# Patient Record
Sex: Male | Born: 1940 | Race: White | Hispanic: No | Marital: Married | Smoking: Former smoker
Health system: Southern US, Community
[De-identification: ages and names within clinical notes are randomized; demographics above are authoritative.]

## PROBLEM LIST (undated history)

## (undated) DIAGNOSIS — K439 Ventral hernia without obstruction or gangrene: Secondary | ICD-10-CM

## (undated) DIAGNOSIS — I498 Other specified cardiac arrhythmias: Secondary | ICD-10-CM

## (undated) DIAGNOSIS — I872 Venous insufficiency (chronic) (peripheral): Secondary | ICD-10-CM

## (undated) DIAGNOSIS — J45909 Unspecified asthma, uncomplicated: Secondary | ICD-10-CM

## (undated) DIAGNOSIS — R1013 Epigastric pain: Secondary | ICD-10-CM

## (undated) DIAGNOSIS — H409 Unspecified glaucoma: Secondary | ICD-10-CM

## (undated) DIAGNOSIS — T8859XA Other complications of anesthesia, initial encounter: Secondary | ICD-10-CM

## (undated) DIAGNOSIS — E785 Hyperlipidemia, unspecified: Secondary | ICD-10-CM

## (undated) DIAGNOSIS — H539 Unspecified visual disturbance: Secondary | ICD-10-CM

## (undated) DIAGNOSIS — I4819 Other persistent atrial fibrillation: Secondary | ICD-10-CM

## (undated) DIAGNOSIS — N200 Calculus of kidney: Secondary | ICD-10-CM

## (undated) DIAGNOSIS — I10 Essential (primary) hypertension: Secondary | ICD-10-CM

## (undated) DIAGNOSIS — H918X9 Other specified hearing loss, unspecified ear: Secondary | ICD-10-CM

## (undated) DIAGNOSIS — J309 Allergic rhinitis, unspecified: Secondary | ICD-10-CM

## (undated) DIAGNOSIS — R55 Syncope and collapse: Secondary | ICD-10-CM

## (undated) DIAGNOSIS — C8330 Diffuse large B-cell lymphoma, unspecified site: Secondary | ICD-10-CM

## (undated) DIAGNOSIS — M7989 Other specified soft tissue disorders: Secondary | ICD-10-CM

## (undated) HISTORY — DX: Epigastric pain: R10.13

## (undated) HISTORY — DX: Other specified hearing loss, unspecified ear: H91.8X9

## (undated) HISTORY — PX: CATARACT EXTRACTION: SUR2

## (undated) HISTORY — DX: Ventral hernia without obstruction or gangrene: K43.9

## (undated) HISTORY — DX: Other specified soft tissue disorders: M79.89

## (undated) HISTORY — DX: Other persistent atrial fibrillation: I48.19

## (undated) HISTORY — DX: Essential (primary) hypertension: I10

## (undated) HISTORY — DX: Hyperlipidemia, unspecified: E78.5

## (undated) HISTORY — DX: Unspecified asthma, uncomplicated: J45.909

## (undated) HISTORY — DX: Unspecified glaucoma: H40.9

## (undated) HISTORY — DX: Venous insufficiency (chronic) (peripheral): I87.2

## (undated) HISTORY — DX: Diffuse large B-cell lymphoma, unspecified site: C83.30

## (undated) HISTORY — DX: Allergic rhinitis, unspecified: J30.9

## (undated) HISTORY — DX: Calculus of kidney: N20.0

## (undated) HISTORY — DX: Unspecified visual disturbance: H53.9

## (undated) HISTORY — DX: Other specified cardiac arrhythmias: I49.8

## (undated) HISTORY — DX: Syncope and collapse: R55

---

## 2000-10-30 ENCOUNTER — Emergency Department (HOSPITAL_COMMUNITY): Admission: EM | Admit: 2000-10-30 | Discharge: 2000-10-30 | Payer: Self-pay | Admitting: Internal Medicine

## 2000-10-30 ENCOUNTER — Encounter: Payer: Self-pay | Admitting: Internal Medicine

## 2003-03-01 ENCOUNTER — Ambulatory Visit (HOSPITAL_COMMUNITY): Admission: RE | Admit: 2003-03-01 | Discharge: 2003-03-01 | Payer: Self-pay | Admitting: Specialist

## 2004-02-02 ENCOUNTER — Encounter: Admission: RE | Admit: 2004-02-02 | Discharge: 2004-02-02 | Payer: Self-pay | Admitting: Internal Medicine

## 2004-06-08 ENCOUNTER — Ambulatory Visit: Payer: Self-pay | Admitting: Internal Medicine

## 2004-12-08 ENCOUNTER — Emergency Department (HOSPITAL_COMMUNITY): Admission: EM | Admit: 2004-12-08 | Discharge: 2004-12-08 | Payer: Self-pay | Admitting: Emergency Medicine

## 2004-12-14 ENCOUNTER — Ambulatory Visit: Payer: Self-pay | Admitting: Internal Medicine

## 2005-08-07 LAB — HM COLONOSCOPY: HM Colonoscopy: NEGATIVE

## 2005-10-30 ENCOUNTER — Ambulatory Visit: Payer: Self-pay | Admitting: Internal Medicine

## 2005-11-05 ENCOUNTER — Ambulatory Visit: Payer: Self-pay | Admitting: Internal Medicine

## 2006-06-28 ENCOUNTER — Ambulatory Visit: Payer: Self-pay | Admitting: Internal Medicine

## 2006-11-15 ENCOUNTER — Ambulatory Visit: Payer: Self-pay | Admitting: Internal Medicine

## 2006-11-15 ENCOUNTER — Encounter: Payer: Self-pay | Admitting: Internal Medicine

## 2006-11-26 ENCOUNTER — Ambulatory Visit: Payer: Self-pay | Admitting: Internal Medicine

## 2006-11-26 LAB — CONVERTED CEMR LAB
HDL: 51.3 mg/dL (ref >39.0)
LDL Cholesterol: 122 mg/dL — ABNORMAL HIGH (ref 0–99)
Total CHOL/HDL Ratio: 3.6
Triglycerides: 59 mg/dL (ref 0–149)
VLDL: 12 mg/dL (ref 0–40)

## 2006-12-12 DIAGNOSIS — J309 Allergic rhinitis, unspecified: Secondary | ICD-10-CM | POA: Insufficient documentation

## 2006-12-12 DIAGNOSIS — H409 Unspecified glaucoma: Secondary | ICD-10-CM | POA: Insufficient documentation

## 2006-12-12 DIAGNOSIS — I10 Essential (primary) hypertension: Secondary | ICD-10-CM

## 2006-12-12 DIAGNOSIS — H918X9 Other specified hearing loss, unspecified ear: Secondary | ICD-10-CM | POA: Insufficient documentation

## 2006-12-12 HISTORY — DX: Allergic rhinitis, unspecified: J30.9

## 2006-12-12 HISTORY — DX: Other specified hearing loss, unspecified ear: H91.8X9

## 2006-12-12 HISTORY — DX: Unspecified glaucoma: H40.9

## 2006-12-12 HISTORY — DX: Essential (primary) hypertension: I10

## 2007-04-25 ENCOUNTER — Ambulatory Visit: Payer: Self-pay | Admitting: Internal Medicine

## 2007-05-04 LAB — CONVERTED CEMR LAB
Cholesterol: 142 mg/dL (ref 0–200)
HDL: 53.4 mg/dL (ref >39.0)
LDL Cholesterol: 77 mg/dL (ref 0–99)
Total CHOL/HDL Ratio: 2.7

## 2007-05-06 ENCOUNTER — Encounter (INDEPENDENT_AMBULATORY_CARE_PROVIDER_SITE_OTHER): Payer: Self-pay | Admitting: *Deleted

## 2007-11-19 ENCOUNTER — Telehealth (INDEPENDENT_AMBULATORY_CARE_PROVIDER_SITE_OTHER): Payer: Self-pay | Admitting: *Deleted

## 2007-11-28 ENCOUNTER — Ambulatory Visit: Payer: Self-pay | Admitting: Internal Medicine

## 2007-11-28 DIAGNOSIS — E785 Hyperlipidemia, unspecified: Secondary | ICD-10-CM

## 2007-11-28 DIAGNOSIS — J45909 Unspecified asthma, uncomplicated: Secondary | ICD-10-CM

## 2007-11-28 HISTORY — DX: Hyperlipidemia, unspecified: E78.5

## 2007-11-28 HISTORY — DX: Unspecified asthma, uncomplicated: J45.909

## 2008-03-24 ENCOUNTER — Ambulatory Visit: Payer: Self-pay | Admitting: Internal Medicine

## 2008-05-24 ENCOUNTER — Telehealth (INDEPENDENT_AMBULATORY_CARE_PROVIDER_SITE_OTHER): Payer: Self-pay | Admitting: *Deleted

## 2008-05-26 ENCOUNTER — Ambulatory Visit: Payer: Self-pay | Admitting: Internal Medicine

## 2008-06-07 ENCOUNTER — Encounter (INDEPENDENT_AMBULATORY_CARE_PROVIDER_SITE_OTHER): Payer: Self-pay | Admitting: *Deleted

## 2008-06-07 LAB — CONVERTED CEMR LAB
ALT: 17 units/L (ref 0–53)
Albumin: 3.8 g/dL (ref 3.5–5.2)
Alkaline Phosphatase: 49 units/L (ref 39–117)
BUN: 14 mg/dL (ref 6–23)
Cholesterol: 141 mg/dL (ref 0–200)
Creatinine, Ser: 0.9 mg/dL (ref 0.4–1.5)
LDL Cholesterol: 64 mg/dL (ref 0–99)
Total Protein: 6.4 g/dL (ref 6.0–8.3)
Triglycerides: 75 mg/dL (ref 0–149)
VLDL: 15 mg/dL (ref 0–40)

## 2008-06-08 ENCOUNTER — Encounter (INDEPENDENT_AMBULATORY_CARE_PROVIDER_SITE_OTHER): Payer: Self-pay | Admitting: Internal Medicine

## 2008-06-08 ENCOUNTER — Ambulatory Visit: Payer: Self-pay | Admitting: Internal Medicine

## 2008-06-08 ENCOUNTER — Telehealth: Payer: Self-pay | Admitting: Internal Medicine

## 2008-06-08 ENCOUNTER — Ambulatory Visit: Payer: Self-pay | Admitting: Surgery

## 2008-06-08 ENCOUNTER — Observation Stay (HOSPITAL_COMMUNITY): Admission: AD | Admit: 2008-06-08 | Discharge: 2008-06-09 | Payer: Self-pay | Admitting: Internal Medicine

## 2008-06-08 DIAGNOSIS — I498 Other specified cardiac arrhythmias: Secondary | ICD-10-CM | POA: Insufficient documentation

## 2008-06-08 DIAGNOSIS — R55 Syncope and collapse: Secondary | ICD-10-CM | POA: Insufficient documentation

## 2008-06-08 HISTORY — DX: Other specified cardiac arrhythmias: I49.8

## 2008-06-28 ENCOUNTER — Ambulatory Visit: Payer: Self-pay | Admitting: Internal Medicine

## 2008-08-31 ENCOUNTER — Telehealth: Payer: Self-pay | Admitting: Internal Medicine

## 2008-09-06 ENCOUNTER — Telehealth (INDEPENDENT_AMBULATORY_CARE_PROVIDER_SITE_OTHER): Payer: Self-pay | Admitting: *Deleted

## 2008-09-08 ENCOUNTER — Telehealth (INDEPENDENT_AMBULATORY_CARE_PROVIDER_SITE_OTHER): Payer: Self-pay | Admitting: *Deleted

## 2008-10-22 ENCOUNTER — Encounter (INDEPENDENT_AMBULATORY_CARE_PROVIDER_SITE_OTHER): Payer: Self-pay | Admitting: *Deleted

## 2008-11-22 ENCOUNTER — Telehealth (INDEPENDENT_AMBULATORY_CARE_PROVIDER_SITE_OTHER): Payer: Self-pay | Admitting: *Deleted

## 2008-12-17 ENCOUNTER — Telehealth (INDEPENDENT_AMBULATORY_CARE_PROVIDER_SITE_OTHER): Payer: Self-pay | Admitting: *Deleted

## 2008-12-27 ENCOUNTER — Telehealth (INDEPENDENT_AMBULATORY_CARE_PROVIDER_SITE_OTHER): Payer: Self-pay | Admitting: *Deleted

## 2009-01-04 ENCOUNTER — Ambulatory Visit: Payer: Self-pay | Admitting: Internal Medicine

## 2009-01-04 DIAGNOSIS — K439 Ventral hernia without obstruction or gangrene: Secondary | ICD-10-CM

## 2009-01-04 HISTORY — DX: Ventral hernia without obstruction or gangrene: K43.9

## 2009-01-21 ENCOUNTER — Encounter: Payer: Self-pay | Admitting: Internal Medicine

## 2009-02-05 ENCOUNTER — Emergency Department (HOSPITAL_COMMUNITY): Admission: EM | Admit: 2009-02-05 | Discharge: 2009-02-05 | Payer: Self-pay | Admitting: Emergency Medicine

## 2009-02-09 ENCOUNTER — Encounter: Payer: Self-pay | Admitting: Internal Medicine

## 2009-03-09 ENCOUNTER — Encounter: Payer: Self-pay | Admitting: Internal Medicine

## 2009-03-16 ENCOUNTER — Encounter: Payer: Self-pay | Admitting: Internal Medicine

## 2009-03-28 ENCOUNTER — Telehealth (INDEPENDENT_AMBULATORY_CARE_PROVIDER_SITE_OTHER): Payer: Self-pay | Admitting: *Deleted

## 2009-06-14 ENCOUNTER — Ambulatory Visit: Payer: Self-pay | Admitting: Internal Medicine

## 2009-07-06 ENCOUNTER — Ambulatory Visit: Payer: Self-pay | Admitting: Internal Medicine

## 2009-07-06 LAB — CONVERTED CEMR LAB
BUN: 11 mg/dL (ref 6–23)
Bilirubin, Direct: 0.1 mg/dL (ref 0.0–0.3)
Cholesterol: 129 mg/dL (ref 0–200)
Creatinine, Ser: 0.7 mg/dL (ref 0.4–1.5)
HDL: 59.8 mg/dL (ref >39.00)
LDL Cholesterol: 61 mg/dL (ref 0–99)
Potassium: 4.2 meq/L (ref 3.5–5.1)
Total Protein: 6.2 g/dL (ref 6.0–8.3)
VLDL: 8 mg/dL (ref 0.0–40.0)

## 2009-07-11 ENCOUNTER — Telehealth (INDEPENDENT_AMBULATORY_CARE_PROVIDER_SITE_OTHER): Payer: Self-pay | Admitting: *Deleted

## 2009-07-14 ENCOUNTER — Ambulatory Visit: Payer: Self-pay | Admitting: Internal Medicine

## 2009-07-14 DIAGNOSIS — R1013 Epigastric pain: Secondary | ICD-10-CM

## 2009-07-14 HISTORY — DX: Epigastric pain: R10.13

## 2009-07-28 ENCOUNTER — Ambulatory Visit: Payer: Self-pay | Admitting: Internal Medicine

## 2009-07-28 LAB — CONVERTED CEMR LAB: OCCULT 3: NEGATIVE

## 2009-07-29 ENCOUNTER — Encounter (INDEPENDENT_AMBULATORY_CARE_PROVIDER_SITE_OTHER): Payer: Self-pay | Admitting: *Deleted

## 2009-12-12 ENCOUNTER — Telehealth: Payer: Self-pay | Admitting: Internal Medicine

## 2010-01-18 ENCOUNTER — Ambulatory Visit: Payer: Self-pay | Admitting: Internal Medicine

## 2010-01-18 DIAGNOSIS — I872 Venous insufficiency (chronic) (peripheral): Secondary | ICD-10-CM | POA: Insufficient documentation

## 2010-01-18 DIAGNOSIS — R609 Edema, unspecified: Secondary | ICD-10-CM | POA: Insufficient documentation

## 2010-01-18 DIAGNOSIS — T148XXA Other injury of unspecified body region, initial encounter: Secondary | ICD-10-CM | POA: Insufficient documentation

## 2010-01-18 HISTORY — DX: Venous insufficiency (chronic) (peripheral): I87.2

## 2010-01-24 ENCOUNTER — Telehealth (INDEPENDENT_AMBULATORY_CARE_PROVIDER_SITE_OTHER): Payer: Self-pay | Admitting: *Deleted

## 2010-03-14 ENCOUNTER — Telehealth: Payer: Self-pay | Admitting: Family Medicine

## 2010-03-24 ENCOUNTER — Ambulatory Visit: Payer: Self-pay | Admitting: Family Medicine

## 2010-03-24 DIAGNOSIS — N200 Calculus of kidney: Secondary | ICD-10-CM

## 2010-03-24 HISTORY — DX: Calculus of kidney: N20.0

## 2010-06-23 ENCOUNTER — Ambulatory Visit: Payer: Self-pay | Admitting: Family Medicine

## 2010-06-28 LAB — CONVERTED CEMR LAB
Alkaline Phosphatase: 56 units/L (ref 39–117)
Bilirubin, Direct: 0.2 mg/dL (ref 0.0–0.3)
Calcium: 9 mg/dL (ref 8.4–10.5)
GFR calc non Af Amer: 139.13 mL/min (ref >60.00)
HDL: 57.9 mg/dL (ref >39.00)
LDL Cholesterol: 67 mg/dL (ref 0–99)
Sodium: 140 meq/L (ref 135–145)
Total CHOL/HDL Ratio: 2
VLDL: 9.2 mg/dL (ref 0.0–40.0)

## 2010-06-29 ENCOUNTER — Ambulatory Visit: Payer: Self-pay | Admitting: Family Medicine

## 2010-08-10 NOTE — Assessment & Plan Note (Signed)
Summary: 3 month fup//ccm/pt rsc/cjr   Vital Signs:  Patient profile:   70 year old male Weight:      199 pounds Temp:     97.8 degrees F oral BP sitting:   120 / 64  (left arm) Cuff size:   large  Vitals Entered By: Sid Falcon LPN (June 29, 2010 8:40 AM)  History of Present Illness: R abd wall hernia.  More painful recently. Present for a few years.  Bulges out at times and becoming more painful Episodes of pain are infrequent.  Location is R lower quadrant. No inguinal swelling.   Hypertension History:      He denies headache, chest pain, palpitations, dyspnea with exertion, orthopnea, peripheral edema, neurologic problems, syncope, and side effects from treatment.  He notes no problems with any antihypertensive medication side effects.        Positive major cardiovascular risk factors include male age 50 years old or older, hyperlipidemia, and hypertension.  Negative major cardiovascular risk factors include no history of diabetes, negative family history for ischemic heart disease, and non-tobacco-user status.        Further assessment for target organ damage reveals no history of ASHD, stroke/TIA, or peripheral vascular disease.    Lipid Management History:      Positive NCEP/ATP III risk factors include male age 45 years old or older and hypertension.  Negative NCEP/ATP III risk factors include non-diabetic, no family history for ischemic heart disease, non-tobacco-user status, no ASHD (atherosclerotic heart disease), no prior stroke/TIA, no peripheral vascular disease, and no history of aortic aneurysm.      Allergies: 1)  ! Sulfa 2)  ! Pcn 3)  ! Erythromycin  Past History:  Past Medical History: Last updated: 06/28/2008 Allergic rhinitis Hypertension Hyperlipidemia Asthma Glaucoma ; Syncope (negative Cardio/Neuro evaluation) 12/09  Past Surgical History: Last updated: 07/14/2009 Cataract  bilat & glaucoma surgery ( 7 Ophth surgeries) Colonoscopy  2003 &  2007 negative  Family History: Last updated: 06/08/2008 Family History Congen Heart Disease,bro Family HistoryBrain Tumor, [place] Family History Suicide, 2 M uncles, M aunt Family History Lung cancer, P aunt  Social History: Last updated: 06/08/2008 Alcohol use-yes Regular exercise-no Retired Married  Risk Factors: Exercise: no (12/12/2006)  Risk Factors: Smoking Status: quit (03/24/2010) PMH-FH-SH reviewed for relevance  Review of Systems  The patient denies anorexia, fever, weight loss, chest pain, syncope, dyspnea on exertion, hemoptysis, melena, hematochezia, and severe indigestion/heartburn.    Physical Exam  General:  Well-developed,well-nourished,in no acute distress; alert,appropriate and cooperative throughout examination Ears:  External ear exam shows no significant lesions or deformities.  Otoscopic examination reveals clear canals, tympanic membranes are intact bilaterally without bulging, retraction, inflammation or discharge. Hearing is grossly normal bilaterally. Mouth:  Oral mucosa and oropharynx without lesions or exudates.  Teeth in good repair. Neck:  No deformities, masses, or tenderness noted. Lungs:  Normal respiratory effort, chest expands symmetrically. Lungs are clear to auscultation, no crackles or wheezes. Heart:  Normal rate and regular rhythm. S1 and S2 normal without gallop, murmur, click, rub or other extra sounds. Abdomen:  soft, non-tender, normal bowel sounds, no distention, and no masses.  No hernia palpated at this time Extremities:  no edema. Neurologic:  alert & oriented X3 and cranial nerves II-XII intact.   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  Surgical referral. Orders: Surgical Referral (Surgery)  Problem # 3:  HYPERTENSION (ICD-401.9)  His updated medication list for this problem includes:    Norvasc 5 Mg Tabs (Amlodipine besylate) .Marland Kitchen... 1 by mouth bid    Lotensin 20 Mg Tabs (Benazepril hcl) .Marland Kitchen..Marland Kitchen Two times a day  Problem # 4:  Preventive Health Care (ICD-V70.0) pt requesting Zostavax.  No contraindications.  Complete Medication List: 1)  Cialis 20 Mg Tabs (Tadalafil) 2)  Norvasc 5 Mg Tabs (Amlodipine besylate) .Marland Kitchen.. 1 by mouth bid 3)  Lotensin 20 Mg Tabs (Benazepril hcl) .... Two times a day 4)  Pravastatin Sodium 40 Mg Tabs (Pravastatin sodium) .... Take one tablet at bedtime**labs due now** 5)  Multivitamins Tabs (Multiple vitamin) .... Take 1 tablet by mouth daily 6)  Aspirin Ec Lo-dose 81 Mg Tbec (Aspirin) .... Take 1 tablet daily by mouth 7)  Oxycodone-acetaminophen 5-325 Mg Tabs (Oxycodone-acetaminophen) .Marland Kitchen.. 1-2 by mouth q 4-6 hours as needed severe pain  Other Orders: Zoster (Shingles) Vaccine Live [id number]) Admin 1st Vaccine (82956)  Hypertension Assessment/Plan:      The patient's hypertensive risk group is category B: At least one risk factor (excluding diabetes) with no target organ damage.  His calculated 10 year risk of coronary heart disease is 7 %.  Today's blood pressure is 120/64.    Lipid Assessment/Plan:      Based on NCEP/ATP III, the patient's risk factor category is "0-1 risk factors".  The patient's lipid goals are as follows: Total cholesterol goal is 200; LDL cholesterol goal is 130; HDL cholesterol goal is 40; Triglyceride goal is 150.  His LDL cholesterol goal has been met.    Patient Instructions: 1)  Please schedule a follow-up appointment in 6 months .  2)  Check your  Blood Pressure regularly . If it is above:140/90   you should make an appointment.   Orders Added: 1)  Est. Patient Level IV [21308] 2)  Surgical  Referral [Surgery] 3)  Zoster (Shingles) Vaccine Live [90736] 4)  Admin 1st Vaccine [65784]   Immunizations Administered:  Zostavax # 1:    Vaccine Type: Zostavax    Site: left deltoid    Mfr: Merck    Dose: 0.5 ml    Route: [place]    Given by: Sid Falcon LPN    Exp. Date: 05/10/2011    Lot #: [id number]    VIS given: 04/20/05 given June 29, 2010.   Immunizations Administered:  Zostavax # 1:    Vaccine Type: Zostavax    Site: left deltoid    Mfr: Merck    Dose: 0.5 ml    Route: [place]    Given by: Sid Falcon LPN    Exp. Date: 05/10/2011    Lot #: [id number]    VIS given: 04/20/05 given June 29, 2010.

## 2010-08-10 NOTE — Progress Notes (Signed)
Summary: wants referral for hernia  Phone Note Call from Patient Call back at Work Phone [phone]   Caller: Patient Summary of Call: Was referred to surgeon for hernia and had decided not to do it, but becoming more of a problem, would like referral again Initial call taken by: Kandice Hams,  December 12, 2009 4:23 PM  Follow-up for Phone Call        done; please send 01/04/2009 OV note Follow-up by: Marga Melnick MD,  December 13, 2009 5:33 AM

## 2010-08-10 NOTE — Assessment & Plan Note (Signed)
Summary: lump on shin/cbs   Vital Signs:  Patient profile:   70 year old male Weight:      197.8 pounds Temp:     98.7 degrees F oral Pulse rate:   56 / minute Resp:     16 per minute BP sitting:   112 / 70  (left arm) Cuff size:   large  Vitals Entered By: Shonna Chock CMA (January 18, 2010 3:45 PM) CC: Lump on left leg x 2 weeks (injured)  Comments REVIEWED MED LIST, PATIENT AGREED DOSE AND INSTRUCTION CORRECT    CC:  Lump on left leg x 2 weeks (injured) .  History of Present Illness: Persistent swelling over L superior tibia after being struck by 1/2 liter bottle of water which fell from fridge. Rx: ice X 3-4 days  with some decrease in size. but stable X 1 week.  Allergies: 1)  ! Sulfa 2)  ! Pcn 3)  ! Erythromycin  Review of Systems General:  Denies chills, fever, and sweats. CV:  Denies chest pain or discomfort, difficulty breathing at night, and difficulty breathing while lying down. Resp:  Denies chest pain with inspiration, coughing up blood, shortness of breath, and wheezing.  Physical Exam  General:  in no acute distress; alert,appropriate and cooperative throughout examination Pulses:  R and L dorsalis pedis and posterior tibial pulses are full and equal bilaterally Extremities:  1+ left pedal edema and 1+ right pedal edema.  Neg Homan's . 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 1 by mouth bid 3)  Lotensin 20 Mg Tabs (Benazepril hcl) .... Two times a day 4)  Pravastatin Sodium 40 Mg Tabs (Pravastatin sodium) .... Take one tablet at bedtime**labs due now** 5)   Multivitamins Tabs (Multiple vitamin) .... Take 1 tablet by mouth daily 6)  Aspirin Ec Lo-dose 81 Mg Tbec (Aspirin) .... Take 1 tablet daily by mouth 7)  Ranitidine Hcl 150 Mg Caps (Ranitidine hcl) .Marland Kitchen.. 1bid pre meals  Patient Instructions: 1)  Consider support hose for edema & venous insufficiency.Limit your Sodium (Salt) to less than 4 grams a day (slightly less than 1 teaspoon) to prevent fluid retention, swelling, or worsening or symptoms.

## 2010-08-10 NOTE — Miscellaneous (Signed)
Summary: Waiver of Liability for Shingles Vaccine  Waiver of Liability for Shingles Vaccine   Imported By: Maryln Gottron 07/06/2010 09:11:44  _____________________________________________________________________  External Attachment:    Type:   Image     Comment:   External Document

## 2010-08-10 NOTE — Progress Notes (Signed)
Summary: REQUEST FOR SWITCH  Phone Note Call from Patient   Caller: Patient Summary of Call: Pt called to request a switch from Dr Alwyn Ren at [facility]  to  Dr Caryl Never at [facility]..... Pt adv that he has recently moved to [place] and would like to go to a PCP closer to home so his wife doesn't have to get off of work to take him across town to [employer].... For this reason, pt would like to switch to [facility]..... Pt was adv that have to mutually agree to the switch, then an appt can be scheduled..... Ok to switch?  Pt can be reached at [phone].  Initial call taken by: Debbra Riding,  March 14, 2010 9:18 AM  Follow-up for Phone Call        Transfer of care appropriate based on logistics . It has been an honor & pleasure to work with him over the years. Follow-up by: Marga Melnick MD,  March 14, 2010 5:08 PM  Additional Follow-up for Phone Call Additional follow up Details #1::        OK with me Additional Follow-up by: Evelena Peat MD,  March 14, 2010 6:22 PM    Additional Follow-up for Phone Call Additional follow up Details #2::    Appt scheduled for 9/15 at 3pm with Dr Caryl Never for est w/ [facility].  Follow-up by: Debbra Riding,  March 15, 2010 8:35 AM

## 2010-08-10 NOTE — Progress Notes (Signed)
Summary: Refill Request  Phone Note Refill Request Call back at [phone] Message from:  Pharmacy on January 24, 2010 9:05 AM  Refills Requested: Medication #1:  NORVASC 5 MG  TABS 1 by mouth bid   Dosage confirmed as above?Dosage Confirmed   Supply Requested: 3 months   Last Refilled: 10/12/2009 [facility]  Next Appointment Scheduled: none Initial call taken by: Lavell Islam,  January 24, 2010 9:06 AM    Prescriptions: NORVASC 5 MG  TABS (AMLODIPINE BESYLATE) 1 by mouth bid  #180 x 2   Entered by:   Shonna Chock CMA   Authorized by:   Marga Melnick MD   Signed by:   Shonna Chock CMA on 01/24/2010   Method used:   Electronically to        [facility]* (retail)       [address]       [place], [place]  [address]       Ph: [phone]       Fax: [phone]   RxID:   [id number]

## 2010-08-10 NOTE — Progress Notes (Signed)
Summary: amlodipine refill   Phone Note Refill Request Message from:  Pharmacy on [facility] FAX [phone]  AMLODIPINE BESYLATE 5MG   Initial call taken by: Barb Merino,  March 28, 2009 10:26 AM    Prescriptions: NORVASC 5 MG  TABS (AMLODIPINE BESYLATE) 1 by mouth bid  #180 x 0   Entered by:   Doristine Devoid   Authorized by:   Neena Rhymes MD   Signed by:   Doristine Devoid on 03/28/2009   Method used:   Electronically to        [facility]* (retail)       [address]       [place], [place]  [address]       Ph: [phone]       Fax: [phone]   RxID:   [id number]

## 2010-08-10 NOTE — Progress Notes (Signed)
Summary: refill request   Phone Note Refill Request   Refills Requested: Medication #1:  NORVASC 5 MG  TABS 1 by mouth bid   Dosage confirmed as above?Dosage Confirmed   Brand Name Necessary? No refill request from [facility]  Next Appointment Scheduled: 07/14/09 Initial call taken by: Michaelle Copas,  July 11, 2009 4:58 PM    Prescriptions: NORVASC 5 MG  TABS (AMLODIPINE BESYLATE) 1 by mouth bid  #180 x 1   Entered by:   Shonna Chock   Authorized by:   Marga Melnick MD   Signed by:   Shonna Chock on 07/13/2009   Method used:   Electronically to        [facility]* (retail)       [address]       [place], [place]  [address]       Ph: [phone]       Fax: [phone]   RxID:   [id number]

## 2010-08-10 NOTE — Letter (Signed)
Summary: Results Follow up Letter  [facility] [place]  [address] [place], [place] [address]   Phone: [phone]  Fax: [phone]    07/29/2009 MRN: [id number]  [place] Charles Li [address] [place], [place]  [address]  Dear Charles Li,  The following are the results of your recent test(s):  Test         Result    Pap Smear:        Normal _____  Not Normal _____ Comments: ______________________________________________________ Cholesterol: LDL(Bad cholesterol):         Your goal is less than:         HDL (Good cholesterol):       Your goal is more than: Comments:  ______________________________________________________ Mammogram:        Normal _____  Not Normal _____ Comments:  ___________________________________________________________________ Hemoccult:        Normal __X___  Not normal _______ Comments:    _____________________________________________________________________ Other Tests:    We routinely do not discuss normal results over the telephone.  If you desire a copy of the results, or you have any questions about this information we can discuss them at your next office visit.   Sincerely,

## 2010-08-10 NOTE — Assessment & Plan Note (Signed)
Summary: review labs//fd   Vital Signs:  Patient profile:   70 year old male Weight:      199.3 pounds Pulse rate:   54 / minute Resp:     15 per minute BP sitting:   122 / 74  (left arm) Cuff size:   large  Vitals Entered By: Shonna Chock (July 14, 2009 9:20 AM) CC: Review Labs(copy given) and refill meds, Hypertension Management Comments REVIEWED MED LIST, PATIENT AGREED DOSE AND INSTRUCTION CORRECT    CC:  Review Labs(copy given) and refill meds and Hypertension Management.  History of Present Illness: Labs reviewed ; all @ goal on Pravastatin 40 mg at bedtime.No specific diet. CVE as walking 5X/ week 30-60 min w/o symptoms. New issue is epigastric aching  pain for 1 hour  post meal 2-3 x /week, w/o specific trigger. Colonoscopy X 2 negative; no Endoscopy.  Hypertension History:      He complains of peripheral edema and visual symptoms, but denies headache, chest pain, palpitations, dyspnea with exertion, orthopnea, PND, neurologic problems, syncope, and side effects from treatment.  He notes no problems with any antihypertensive medication side effects.        Positive major cardiovascular risk factors include male age 2 years old or older, hyperlipidemia, and hypertension.  Negative major cardiovascular risk factors include no history of diabetes, negative family history for ischemic heart disease, and non-tobacco-user status.        Further assessment for target organ damage reveals no history of ASHD, stroke/TIA, or peripheral vascular disease.     Allergies: 1)  ! Sulfa 2)  ! Pcn 3)  ! Erythromycin  Past History:  Past Surgical History: Cataract  bilat & glaucoma surgery ( 7 Ophth surgeries) Colonoscopy  2003 & 2007 negative  Review of Systems General:  Denies chills, fever, sweats, and weight loss. Eyes:  Complains of blurring and vision loss-both eyes; Loss from glaucoma. ENT:  Denies difficulty swallowing and hoarseness. CV:  Denies leg cramps with exertion,  lightheadness, and near fainting; Occa edema  of feet. GI:  See HPI; Denies bloody stools, constipation, dark tarry stools, diarrhea, and indigestion.  Physical Exam  General:  well-nourished,in no acute distress; alert,appropriate and cooperative throughout examination Eyes:  Scleritis changes; lid edema (no angioedema symptoms; "due to condition & treatment") Mouth:  Oral mucosa and oropharynx without lesions or exudates.  Teeth in good repair. No pharyngeal erythema.   Lungs:  Normal respiratory effort, chest expands symmetrically. Lungs are clear to auscultation, no crackles or wheezes. Heart:  normal rate, regular rhythm, no gallop, no rub, no JVD, no HJR, and grade 1 /6 systolic murmur LSB.   Abdomen:  Bowel sounds positive,abdomen soft and non-tender without masses, organomegaly or hernias noted. Pulses:  R and L carotid,radial  and posterior tibial pulses are full and equal bilaterally. Decreased R DPP w/o ischemic change Extremities:  trace left pedal edema and trace right pedal edema.   Neurologic:  alert & oriented X3.   Skin:  Stasis hyperpigmentation over shins; no jaundice Cervical Nodes:  No lymphadenopathy noted Axillary Nodes:  No palpable lymphadenopathy Psych:  memory intact for recent and remote, normally interactive, and good eye contact.     Impression & Recommendations:  Problem # 1:  HYPERLIPIDEMIA (ICD-272.4) Values @ goal His updated medication list for this problem includes:    Pravastatin Sodium 40 Mg Tabs (Pravastatin sodium) .Marland Kitchen... Take one tablet at bedtime**labs due now**  Problem # 2:  ABDOMINAL PAIN, EPIGASTRIC (ICD-789.06)  Post prandial 2-3 X /week  Problem # 3:  HYPERTENSION (ICD-401.9) Controlled His updated medication list for this problem includes:    Norvasc 5 Mg Tabs (Amlodipine besylate) .Marland Kitchen... 1 by mouth bid    Lotensin 20 Mg Tabs (Benazepril hcl) .Marland Kitchen..Marland Kitchen Two times a day  Problem # 4:  ASTHMA (ICD-493.90) Quiescent  Complete Medication  List: 1)  Cialis 20 Mg Tabs (Tadalafil) 2)  Norvasc 5 Mg Tabs (Amlodipine besylate) .Marland Kitchen.. 1 by mouth bid 3)  Lotensin 20 Mg Tabs (Benazepril hcl) .... Two times a day 4)  Pravastatin Sodium 40 Mg Tabs (Pravastatin sodium) .... Take one tablet at bedtime**labs due now** 5)  Multivitamins Tabs (Multiple vitamin) .... Take 1 tablet by mouth daily 6)  Aspirin Ec Lo-dose 81 Mg Tbec (Aspirin) .... Take 1 tablet daily by mouth 7)  Ranitidine Hcl 150 Mg Caps (Ranitidine hcl) .Marland Kitchen.. 1bid pre meals  Hypertension Assessment/Plan:      The patient's hypertensive risk group is category B: At least one risk factor (excluding diabetes) with no target organ damage.  His calculated 10 year risk of coronary heart disease is 7 %.  Today's blood pressure is 122/74.    Patient Instructions: 1)  Complete stool cards. 2)  Avoid foods high in acid (tomatoes, citrus juices, spicy foods). Avoid eating within two hours of lying down or before exercising. Do not over eat; try smaller more frequent meals. Elevate head of bed twelve inches when sleeping. GI consult if no better. Prescriptions: RANITIDINE HCL 150 MG CAPS (RANITIDINE HCL) 1bid pre meals  #60 x 2   Entered and Authorized by:   Marga Melnick MD   Signed by:   Marga Melnick MD on 07/14/2009   Method used:   Faxed to ...       [facility]* (retail)       [address]       [place], [place]  [address]       Ph: [phone]       Fax: [phone]   RxID:   [id number] PRAVASTATIN SODIUM 40 MG  TABS (PRAVASTATIN SODIUM) TAKE ONE TABLET AT BEDTIME**labs due now**  #90 x 3   Entered and Authorized by:   Marga Melnick MD   Signed by:   Marga Melnick MD on 07/14/2009   Method used:   Faxed to ...       [facility]* (retail)       [address]       [place], [place]  [id number]       Ph: [phone]       Fax: [phone]   RxID:   [id number] LOTENSIN 20 MG  TABS (BENAZEPRIL HCL) two times a day  #180 x 3    Entered and Authorized by:   Marga Melnick MD   Signed by:   Marga Melnick MD on 07/14/2009   Method used:   Faxed to ...       [facility]* (retail)       [address]       [place], [place]  [address]       Ph: [phone]       Fax: [phone]   RxID:   [id number]

## 2010-08-10 NOTE — Assessment & Plan Note (Signed)
Summary: PT EST (SWITCH FROM [facility] TO [facility]) OK PER DR BURCHETTE / DR HOPP...   Vital Signs:  Patient profile:   70 year old male Height:      69.50 inches Weight:      200 pounds BMI:     29.22 Temp:     98.0 degrees F oral Pulse rate:   54 / minute Pulse rhythm:   regular Resp:     12 per minute BP sitting:   140 / 78  (left arm) Cuff size:   large  Vitals Entered By: Sid Falcon LPN (March 24, 2010 3:15 PM)  Nutrition Counseling: Patient's BMI is greater than 25 and therefore counseled on weight management options. CC: Transfer from Dr Alwyn Ren in [place], to establish at [facility], Hypertension Management Is Patient Diabetic? No   History of Present Illness: Patient is transferring care here because of proximity to his home and the fact that he is no longer driving. He has been a patient of Dr. Alwyn Ren for several years.  Past medical history is reviewed. He has history of hypertension, glaucoma, hyperlipidemia, asthma, and venous stasis edema.   His medications are reviewed. He is compliant with all. Last labs were last December.  Patient needs flu vaccine.  patient also has history of kidney stones and recent left flank pain about 3 days ago. Pain has ceased earlier today after recurrence. No gross hematuria. No burning with urination. No fever or chills.  Hypertension History:      He denies headache, chest pain, palpitations, dyspnea with exertion, orthopnea, visual symptoms, neurologic problems, and side effects from treatment.        Positive major cardiovascular risk factors include male age 64 years old or older, hyperlipidemia, and hypertension.  Negative major cardiovascular risk factors include no history of diabetes, negative family history for ischemic heart disease, and non-tobacco-user status.        Further assessment for target organ damage reveals no history of ASHD, stroke/TIA, or peripheral vascular disease.     Preventive Screening-Counseling &  Management  Alcohol-Tobacco     Smoking Status: quit     Year Started: 1956     Year Quit: 1990  Allergies: 1)  ! Sulfa 2)  ! Pcn 3)  ! Erythromycin  Past History:  Past Medical History: Last updated: 06/28/2008 Allergic rhinitis Hypertension Hyperlipidemia Asthma Glaucoma ; Syncope (negative Cardio/Neuro evaluation) 12/09  Past Surgical History: Last updated: 07/14/2009 Cataract  bilat & glaucoma surgery ( 7 Ophth surgeries) Colonoscopy  2003 & 2007 negative  Family History: Last updated: 06/08/2008 Family History Congen Heart Disease,bro Family HistoryBrain Tumor, [place] Family History Suicide, 2 M uncles, M aunt Family History Lung cancer, P aunt  Social History: Last updated: 06/08/2008 Alcohol use-yes Regular exercise-no Retired Married  Risk Factors: Exercise: no (12/12/2006)  Risk Factors: Smoking Status: quit (03/24/2010) PMH-FH-SH reviewed for relevance  Physical Exam  General:  Well-developed,well-nourished,in no acute distress; alert,appropriate and cooperative throughout examination Mouth:  Oral mucosa and oropharynx without lesions or exudates.  Teeth in good repair. Neck:  No deformities, masses, or tenderness noted. Lungs:  Normal respiratory effort, chest expands symmetrically. Lungs are clear to auscultation, no crackles or wheezes. Heart:  normal rate and regular rhythm.   Extremities:  trace nonpitting edema lower legs bilaterally Psych:  normally interactive, good eye contact, not anxious appearing, and not depressed appearing.     Impression & Recommendations:  Problem # 1:  HYPERLIPIDEMIA (ICD-272.4) Will schedule fasting labs His updated medication  list for this problem includes:    Pravastatin Sodium 40 Mg Tabs (Pravastatin sodium) .Marland Kitchen... Take one tablet at bedtime**labs due now**  Problem # 2:  HYPERTENSION (ICD-401.9)  His updated medication list for this problem includes:    Norvasc 5 Mg Tabs (Amlodipine besylate) .Marland Kitchen... 1  by mouth bid    Lotensin 20 Mg Tabs (Benazepril hcl) .Marland Kitchen..Marland Kitchen Two times a day  Problem # 3:  RENAL CALCULUS (ICD-592.0) probable recent recurrence.  Pain meds prescribed to use as needed .  He will touch base if any pain by next week to  consider imaging studies.  Problem # 4:  Preventive Health Care (ICD-V70.0) flu vaccine recommended and pt consents.  Complete Medication List: 1)  Cialis 20 Mg Tabs (Tadalafil) 2)  Norvasc 5 Mg Tabs (Amlodipine besylate) .Marland Kitchen.. 1 by mouth bid 3)  Lotensin 20 Mg Tabs (Benazepril hcl) .... Two times a day 4)  Pravastatin Sodium 40 Mg Tabs (Pravastatin sodium) .... Take one tablet at bedtime**labs due now** 5)  Multivitamins Tabs (Multiple vitamin) .... Take 1 tablet by mouth daily 6)  Aspirin Ec Lo-dose 81 Mg Tbec (Aspirin) .... Take 1 tablet daily by mouth 7)  Oxycodone-acetaminophen 5-325 Mg Tabs (Oxycodone-acetaminophen) .Marland Kitchen.. 1-2 by mouth q 4-6 hours as needed severe pain  Other Orders: Admin 1st Vaccine (13086) Flu Vaccine 50yrs + (57846)  Hypertension Assessment/Plan:      The patient's hypertensive risk group is category B: At least one risk factor (excluding diabetes) with no target organ damage.  His calculated 10 year risk of coronary heart disease is 11 %.  Today's blood pressure is 140/78.    Patient Instructions: 1)  Please schedule a follow-up appointment in 3 months .  2)  BMP prior to visit, ICD-9: 401.9 3)  Hepatic Panel prior to visit ICD-9: 272.4 4)  Lipid panel prior to visit ICD-9 : 272.4 Prescriptions: OXYCODONE-ACETAMINOPHEN 5-325 MG TABS (OXYCODONE-ACETAMINOPHEN) 1-2 by mouth q 4-6 hours as needed severe pain  #15 x 0   Entered and Authorized by:   Evelena Peat MD   Signed by:   Evelena Peat MD on 03/24/2010   Method used:   Print then Give to Patient   RxID:   [id number]       Flu Vaccine Consent Questions     Do you have a history of severe allergic reactions to this vaccine? no    Any prior history of  allergic reactions to egg and/or gelatin? no    Do you have a sensitivity to the preservative Thimersol? no    Do you have a past history of Guillan-Barre Syndrome? no    Do you currently have an acute febrile illness? no    Have you ever had a severe reaction to latex? no    Vaccine information given and explained to patient? yes    Are you currently pregnant? no    Lot Number:[id number]   Exp Date:01/06/2011   Site Given  Left Deltoid IMbflu

## 2010-10-05 ENCOUNTER — Telehealth: Payer: Self-pay | Admitting: Family Medicine

## 2010-10-05 MED ORDER — TADALAFIL 20 MG PO TABS: 20.0000 mg | ORAL_TABLET | Freq: Every day | ORAL | Status: DC | PRN

## 2010-10-05 NOTE — Telephone Encounter (Signed)
Pt informed

## 2010-10-05 NOTE — Telephone Encounter (Signed)
Pt would like new rx cialis ?20 mg call into [facility] [phone]

## 2010-10-05 NOTE — Telephone Encounter (Signed)
Go ahead with Cialis 20 mg po qod prn disp # 6 with 3 refills.

## 2010-10-05 NOTE — Telephone Encounter (Signed)
Pt called back again and said that Dr Caryl Never gave him samples of Cialis, because the Levitra didn't work. Pt is req a script for Cialis because it works better. Pt said he already discussed the side effects with Dr Caryl Never, when he was give the samples. Pls call in to [facility].

## 2010-10-05 NOTE — Telephone Encounter (Signed)
I do not see that he has been on this.  Needs office follow up to discuss options and possible side effects.

## 2010-10-15 LAB — URINALYSIS, ROUTINE W REFLEX MICROSCOPIC
Nitrite: NEGATIVE
Specific Gravity, Urine: 1.011 (ref 1.005–1.030)
Urobilinogen, UA: 0.2 mg/dL (ref 0.0–1.0)

## 2010-10-15 LAB — BASIC METABOLIC PANEL
Calcium: 9.4 mg/dL (ref 8.4–10.5)
Creatinine, Ser: 0.83 mg/dL (ref 0.4–1.5)
GFR calc Af Amer: >60 mL/min (ref >60)
GFR calc non Af Amer: >60 mL/min (ref >60)

## 2010-10-15 LAB — DIFFERENTIAL
Basophils Relative: 0 % (ref 0–1)
Monocytes Relative: 4 % (ref 3–12)
Neutro Abs: 12.6 10*3/uL — ABNORMAL HIGH (ref 1.7–7.7)
Neutrophils Relative %: 90 % — ABNORMAL HIGH (ref 43–77)

## 2010-10-15 LAB — URINE MICROSCOPIC-ADD ON

## 2010-10-15 LAB — CBC
MCHC: 34.8 g/dL (ref 30.0–36.0)
RBC: 4.77 MIL/uL (ref 4.22–5.81)
WBC: 14.1 10*3/uL — ABNORMAL HIGH (ref 4.0–10.5)

## 2010-11-06 ENCOUNTER — Encounter: Payer: Self-pay | Admitting: Family Medicine

## 2010-11-07 ENCOUNTER — Encounter: Payer: Self-pay | Admitting: Family Medicine

## 2010-11-07 ENCOUNTER — Ambulatory Visit (INDEPENDENT_AMBULATORY_CARE_PROVIDER_SITE_OTHER): Payer: PRIVATE HEALTH INSURANCE | Admitting: Family Medicine

## 2010-11-07 VITALS — BP 140/80 | Temp 98.2°F | Ht 70.25 in | Wt 202.0 lb

## 2010-11-07 DIAGNOSIS — R1013 Epigastric pain: Secondary | ICD-10-CM

## 2010-11-07 LAB — HEPATIC FUNCTION PANEL
AST: 18 U/L (ref 0–37)
Alkaline Phosphatase: 48 U/L (ref 39–117)
Bilirubin, Direct: 0.2 mg/dL (ref 0.0–0.3)
Total Bilirubin: 1.1 mg/dL (ref 0.3–1.2)

## 2010-11-07 LAB — CBC WITH DIFFERENTIAL/PLATELET
Basophils Absolute: 0 10*3/uL (ref 0.0–0.1)
Hemoglobin: 14.1 g/dL (ref 13.0–17.0)
Lymphocytes Relative: 29.8 % (ref 12.0–46.0)
Monocytes Relative: 9.2 % (ref 3.0–12.0)
Neutrophils Relative %: 57.2 % (ref 43.0–77.0)
Platelets: 207 10*3/uL (ref 150.0–400.0)
RDW: 12.8 % (ref 11.5–14.6)

## 2010-11-07 NOTE — Progress Notes (Signed)
  Subjective:    Patient ID: Charles Li, male    DOB: [birth year], 70 y.o.   MRN: [id number]  HPI Patient seen with onset last Wednesday of some postprandial abdominal discomfort. Symptoms he states were "gas- like" to occasionally dull and sharp. Location midepigastric with possibly some slight radiation right upper quadrant and to the back. Symptoms lasted about 30 minutes and occurred about 30 minutes after eating. No nausea until after breakfast today. No vomiting. No recent stool changes. Good appetite. Denies fever or chills. Took TUMS and over-the-counter anti-gas medication without much sleep. No recent GERD symptoms. No melena or other stool changes. He takes baby aspirin but no other nonsteroidals. Denies any recent chest pain or dyspnea. No history of known gallbladder disease   Review of Systems  Constitutional: Negative for fever, chills, activity change and appetite change.  HENT: Negative for sore throat.   Respiratory: Negative for cough and shortness of breath.   Gastrointestinal: Positive for nausea and abdominal pain. Negative for vomiting, diarrhea, constipation, blood in stool and abdominal distention.  Neurological: Negative for dizziness.       Objective:   Physical Exam  Constitutional: He appears well-developed and well-nourished. No distress.  HENT:  Right Ear: External ear normal.  Left Ear: External ear normal.  Mouth/Throat: Oropharynx is clear and moist. No oropharyngeal exudate.  Neck: No thyromegaly present.  Cardiovascular: Normal rate, regular rhythm and normal heart sounds.   Pulmonary/Chest: Effort normal and breath sounds normal. No respiratory distress. He has no wheezes. He has no rales.  Abdominal: Soft. Bowel sounds are normal. He exhibits no distension and no mass. There is no rebound and no guarding.       Minimally tender midepigastric area. No masses  Lymphadenopathy:    He has no cervical adenopathy.          Assessment & Plan:    Midepigastric pain which is postprandial. Rule out gallstone disease. Obtain ultrasound along with labs including CBC, amylase, and hepatic panel.  If ultrasound negative, consider short term trial of antacid.

## 2010-11-07 NOTE — Patient Instructions (Signed)
Follow up immediately for any fever, vomiting, or worsening abdominal pain.

## 2010-11-08 ENCOUNTER — Telehealth: Payer: Self-pay | Admitting: *Deleted

## 2010-11-08 NOTE — Telephone Encounter (Signed)
Labs were all OK,

## 2010-11-08 NOTE — Progress Notes (Signed)
patient  Is aware 

## 2010-11-08 NOTE — Telephone Encounter (Signed)
Please call pt with lab results, please.

## 2010-11-09 NOTE — Telephone Encounter (Signed)
Pt.notified

## 2010-11-13 ENCOUNTER — Ambulatory Visit
Admission: RE | Admit: 2010-11-13 | Discharge: 2010-11-13 | Disposition: A | Discharge status: Home / Self Care | Payer: PRIVATE HEALTH INSURANCE | Source: Ambulatory Visit | Attending: Family Medicine | Admitting: Family Medicine

## 2010-11-13 ENCOUNTER — Other Ambulatory Visit: Payer: Self-pay | Admitting: Internal Medicine

## 2010-11-13 DIAGNOSIS — R1013 Epigastric pain: Secondary | ICD-10-CM

## 2010-11-14 ENCOUNTER — Telehealth: Payer: Self-pay | Admitting: *Deleted

## 2010-11-14 NOTE — Telephone Encounter (Signed)
VM from pt following informing him of neg [place] for gallstones.  Pt questioning "what's next", what is he to do with ongoing abd pain.

## 2010-11-14 NOTE — Telephone Encounter (Signed)
Make sure he has tried antacid such as Prilosec.  I would recommend referral to GI to further evaluate.  May need EGD to evaluate. We need to see who he has seen previously for colonoscopy.

## 2010-11-14 NOTE — Progress Notes (Signed)
Quick Note:  Pt informed ______ 

## 2010-11-15 NOTE — Telephone Encounter (Signed)
I spoke with pt, he has not tried any antacids, so he will try Prilosec and follow-up if antacid is not helping.  He has had a colonscopy back when he was with Dr Alwyn Ren, does not remember who did it.  I did look through the scanned documents and did not find colonscopy.

## 2010-11-21 NOTE — Assessment & Plan Note (Signed)
[facility]                        [place] OFFICE NOTE   NAME:DOWNEYDeldrick, Charles Li                     MRN:          [id number]  DATE:11/15/2006                            DOB:          02/28/1941    Safal Halderman was seen 11/15/2006 for hypertension and to monitor  medication refills.   His blood pressure has been well-controlled within the range of 100 to  110/75 to 85.  On one occasion, he inadvertently took multivitamins  instead of his blood pressure pills and was found to have a blood  pressure of 202/110 at the [facility] when he attempted to donate blood.   He exercises three days a week at the gym using weights and a treadmill  & bike at a high level .  He has no chest pain, palpitations, or  shortness of breath with this.  He has no headaches or nosebleeds.   His past medical history is unchanged.  He was hospitalized for asthma  at age 70; he stopped smoking following the asthma diagnosis.  He has  had glaucoma surgery, cataract surgery, and a shunting ophthalmologic  procedure as well.  His colonoscopy in 2003 revealed internal  hemorrhoids; repeat is due in 2010.  He is followed by Dr. Hazle Quant for his  glaucoma.   FAMILY HISTORY:  Positive for diabetes in a great-grandmother; his  mother had hypertension.  Father had lung cancer.  There is a history of  a brain tumor in a maternal uncle.  There is also a history of suicide  in an uncle and an aunt.   He drinks socially.   He is ALLERGIC OR INTOLERANT to SULFA, PENICILLIN, and E-MYCIN.   Presently, he is on Amlodipine 5 mg daily, baby coated aspirin, a  multivitamin, Lotensin 20, and eye drops for the glaucoma.   REVIEW OF SYSTEMS:  The remainder of the review of systems is negative  except for occasional pain in the right lower quadrant at the site of a  hernia.  This is not affected by exercises such as crunches, but it is  affected by food intake.  It has not affected his  activities of daily  living but is associated with pain.   PHYSICAL EXAMINATION:  VITAL SIGNS:  The weight is stable at 198.6,  pulse was 60, respiratory rate 14, and blood pressure 110/80.  HEENT:  A fundal exam was difficult; there is evidence of the prior  glaucoma surgeries. Dental hygiene is good; nares are patent, and otic  canals reveal no active process.  NECK:  Thyroid is normal to palpation.  He has no axillary or cervical  lymphadenopathy.  CARDIOVASCULAR:  A grade 0.5 to 1 systolic murmur is noted at the right  base.  There is slight decrease in the pedal pulses, but there are no  ischemic changes.  He does have sclerotic changes over the shins  bilaterally.  ABDOMEN:  He has no organomegaly or masses.  I cannot appreciate a  hernia on palpation.  GENITOURINARY:  There is evidence of a varicocele on the left.  The  prostate is normal; hemoccult  testing is negative.  MUSCULOSKELETAL EXAM:  Negative.  NEUROPSYCHIATRIC EXAM:  Negative.   Medications will be refilled.  In reviewing the chart, he has had  hyperglycemia with normal A1c.  He has had mild elevations of LDL; his  ideal LDL would be less than 100.  Fasting labs will be scheduled.  Lipitor was suggested in May of 2007, but not taken.  He will be  referred to [email or web address] Belly Diet for heart healthy low  carb diet.     Titus Dubin. Alwyn Ren, MD,FACP,FCCP  Electronically Signed    [facility]/MedQ  DD: 11/15/2006  DT: 11/16/2006  Job #: [id number]

## 2010-11-21 NOTE — Consult Note (Signed)
NAME:  ANSON, PEDDIE NO.:  [id number]   MEDICAL RECORD NO.:  [id number]          PATIENT TYPE:  INP   LOCATION:  3712                         FACILITY:  [facility]   PHYSICIAN:  Doylene Canning. Ladona Ridgel, MD    DATE OF BIRTH:  1941-06-21   DATE OF CONSULTATION:  06/09/2008  DATE OF DISCHARGE:  06/09/2008                                 CONSULTATION   REQUESTING PHYSICIANS:  Titus Dubin. Alwyn Ren, MD, FACP, FCCP and  associates.   INDICATION FOR CONSULTATION:  Evaluation of altered consciousness.   HISTORY OF PRESENT ILLNESS:  The patient is a 71 year old male with  hypertension for many years.  He has a history of orthostatic symptoms  in the past where these are rare.  The patient was in his usual state of  health until he suddenly was found to be unresponsive.  He was at this  point in a Bible study and his fellow study members noted that he did  not say anything, his tongue was fluttering, though he did not pass out  and he did not lose control of his bowel or bladder.  He did not have  chest pain.  The patient began to respond after approximately 45  seconds.  The patient states that it felt like he fell asleep.  He notes  that he had not slept as well the night before, though he did state that  he was not as tired as he often is.  After the procedure, there was no  nausea, no diaphoresis, and no other particular symptoms.  He was  subsequently taken to the emergency room.  He was found to be  bradycardic with heart rates in the high 40s; otherwise, his symptoms  have been unremarkable.   Additional past medical history is notable for hypertension.  He has a  history of remote asthma.  He had a stress test over 10 years ago where  he was told this was okay.   SOCIAL HISTORY:  The patient is a retired Solicitor.  He lives in  [place] with his wife.  He has a history of tobacco use but quit  smoking many years ago.  He rarely drinks alcoholic beverages.  She  exercises several times per week without any anginal symptoms.   Family history is notable for mother with hypertension who is deceased,  father in his 35 died with complications of cancer.   Review of systems is notable for decreased vision, wearing glasses.  He  does have some hearing loss.  He has a very mild peripheral edema but  none presently.  He has never had frank syncope.  He denies dysuria,  hematuria, or nocturia.  He has rare arthralgias, particularly in his  lower back.  Otherwise, all systems were reviewed and negative except as  noted in the HPI.   His physical exam is notable for him being a pleasant, well-appearing 2-  year-old man in no distress.  Blood pressure was 156/98, pulse was 50  and regular, respirations were 20, temperature was 97.7.  Orthostatic  symptoms were not significant.  HEENT exam  is normocephalic and  atraumatic.  Pupils are equal and round.  The oropharynx is moist.  Sclerae are anicteric.  The neck revealed no jugular venous distention.  There is no thyromegaly.  Trachea is midline.  The carotids are 2+ and  symmetric.  The lungs are clear bilaterally to auscultation.  There are  no wheezes, rales, or rhonchi present.  No increased work of breathing.  Cardiovascular exam revealed regular rate and rhythm with normal S1 and  S2.  The PMI was not enlarged nor it was laterally displaced.  There was  no S3 or S4 gallop appreciated.  I could not appreciate a murmur.  There  was a regular bradycardia.  Abdominal exam was soft, nontender.  There  was no organomegaly.  The bowel sounds were present.  There was no  rebound or guarding.  Extremities demonstrated no cyanosis, clubbing, or  edema.  The pulses were 2+ and symmetric.  Neurologic exam was alert and  oriented x3.  His cranial nerves were intact.  Strength was 5/5 and  symmetric.   EKG demonstrates sinus bradycardia, normal axis and intervals.  There  was septal Q seen and nonspecific T-wave  abnormality.   IMPRESSION:  1. Isolated episode of unresponsiveness, unclear whether this is true      syncope or whether the patient rather fell asleep.  2. Hypertension.  3. Normal 2-D echocardiogram and chest x-ray.  4. History of asthma remotely.   DISCUSSION:  I have discussed the treatment options with the patient.  I  have recommended a period of watchful waiting.  If he has recurrent  episodes of syncope, then consideration for an implantable loop recorder  would be warranted.  As this is his initial episode of altered  consciousness and I am not even convinced that this represents frank  syncope, I have recommended a period of watchful waiting, continuation  of blood pressure control, and observation for the patient.  I have  asked that he hold off on driving for a week, though because his  symptoms may well be that he fell asleep and not true syncope, I have  not insisted that he not drive.      Doylene Canning. Ladona Ridgel, MD  Electronically Signed     [facility]/MEDQ  D:  06/09/2008  T:  06/10/2008  Job:  [id number]   cc:   Titus Dubin. Alwyn Ren, MD,FACP,FCCP

## 2010-12-25 ENCOUNTER — Other Ambulatory Visit: Payer: Self-pay | Admitting: Internal Medicine

## 2010-12-28 ENCOUNTER — Ambulatory Visit: Payer: Self-pay | Admitting: Family Medicine

## 2011-03-09 ENCOUNTER — Ambulatory Visit (INDEPENDENT_AMBULATORY_CARE_PROVIDER_SITE_OTHER): Payer: PRIVATE HEALTH INSURANCE | Admitting: Family Medicine

## 2011-03-09 ENCOUNTER — Encounter: Payer: Self-pay | Admitting: Family Medicine

## 2011-03-09 VITALS — Temp 97.5°F | Wt 200.0 lb

## 2011-03-09 DIAGNOSIS — L259 Unspecified contact dermatitis, unspecified cause: Secondary | ICD-10-CM

## 2011-03-09 MED ORDER — PREDNISONE 10 MG PO TABS: ORAL_TABLET | ORAL | Status: AC

## 2011-03-09 NOTE — Progress Notes (Signed)
  Subjective:    Patient ID: Charles Li, male    DOB: 1941/05/25, 70 y.o.   MRN: [id number]  HPI Pruritic rash mostly arms and forearms with lesser involvement of trunk. Started about 4 days ago. Known exposure to poison ivy. No history of similar outbreak in past. Has not tried topicals. Difficulty sleeping. Denies any fever or chills. Itch exacerbated by heat. No alleviating factors   Review of Systems  Constitutional: Negative for fever and chills.  Skin: Positive for rash.       Objective:   Physical Exam  Constitutional: He appears well-developed and well-nourished.  Cardiovascular: Normal rate and regular rhythm.   Pulmonary/Chest: Effort normal and breath sounds normal. No respiratory distress. He has no wheezes. He has no rales.  Skin:       Patient has vesicular rash scattered mostly forearms. Erythematous base. No pustules. Nontender.          Assessment & Plan:  Contact dermatitis. Depo-Medrol 80 mg IM. Oral prednisone if he has any persistent or breakthrough symptoms with injection

## 2011-03-09 NOTE — Patient Instructions (Signed)
Poison Ivy (Toxicodendron Dermatitis) Poison ivy is a inflammation of the skin (contact dermatitis) caused by touching the allergens on the leaves of the ivy plant following previous exposure to the plant. The rash usually appears 48 hours after exposure. The rash is usually bumps (papules) or blisters (vesicles) in a linear pattern. Depending on your own sensitivity, the rash may simply cause redness and itching, or it may also progress to blisters which may break open. These must be well cared for to prevent secondary bacterial (germ) infection, followed by scarring. Keep any open areas dry, clean, dressed, and covered with an antibacterial ointment if needed. The eyes may also get puffy. The puffiness is worst in the morning and gets better as the day progresses. This dermatitis usually heals without scarring, within 2 to 3 weeks without treatment. HOME CARE INSTRUCTIONS Thoroughly wash with soap and water as soon as you have been exposed to poison ivy. You have about one half hour to remove the plant resin before it will cause the rash. This washing will destroy the oil or antigen on the skin that is causing, or will cause, the rash. Be sure to wash under your fingernails as any plant resin there will continue to spread the rash. Do not rub skin vigorously when washing affected area. Poison ivy cannot spread if no oil from the plant remains on your body. A rash that has progressed to weeping sores will not spread the rash unless you have not washed thoroughly. It is also important to wash any clothes you have been wearing as these may carry active allergens. The rash will return if you wear the unwashed clothing, even several days later. Avoidance of the plant in the future is the best measure. Poison ivy plant can be recognized by the number of leaves. Generally, poison ivy has three leaves with flowering branches on a single stem. Diphenhydramine may be purchased over the counter and used as needed for  itching. Do not drive with this medication if it makes you drowsy. Ask your caregiver about medication for children. SEEK MEDICAL CARE IF   Open sores develop.   Redness spreads beyond area of rash.   You notice purulent (pus-like) discharge.   You have increased pain.   Other signs of infection develop (such as fever).  Document Released: 06/22/2000 Document Re-Released: 06/13/2009 [employer] Patient Information 2011 [employer]. 

## 2011-04-13 LAB — DIFFERENTIAL
Basophils Relative: 0 % (ref 0–1)
Lymphocytes Relative: 34 % (ref 12–46)
Monocytes Absolute: 0.7 10*3/uL (ref 0.1–1.0)
Monocytes Relative: 9 % (ref 3–12)
Neutro Abs: 4.6 10*3/uL (ref 1.7–7.7)

## 2011-04-13 LAB — CARDIAC PANEL(CRET KIN+CKTOT+MB+TROPI): CK, MB: 3.4 ng/mL (ref 0.3–4.0)

## 2011-04-13 LAB — CBC
HCT: 44.5 % (ref 39.0–52.0)
Hemoglobin: 15 g/dL (ref 13.0–17.0)
MCHC: 33.7 g/dL (ref 30.0–36.0)
RBC: 4.66 MIL/uL (ref 4.22–5.81)

## 2011-04-13 LAB — BASIC METABOLIC PANEL
BUN: 14 mg/dL (ref 6–23)
Chloride: 103 mEq/L (ref 96–112)
Glucose, Bld: 114 mg/dL — ABNORMAL HIGH (ref 70–99)
Potassium: 4.6 mEq/L (ref 3.5–5.1)

## 2011-04-19 ENCOUNTER — Other Ambulatory Visit: Payer: Self-pay | Admitting: Family Medicine

## 2011-06-21 ENCOUNTER — Telehealth: Payer: Self-pay | Admitting: Family Medicine

## 2011-06-21 NOTE — Telephone Encounter (Signed)
Pt informed better to wait until feeling better.  He voiced his understanding

## 2011-06-21 NOTE — Telephone Encounter (Signed)
Pulled from Triage vmail. Wants to know if he can still get flu shot when he has a cold? I figured not, but am not clinical staff, so couldn't call him back. Thanks!

## 2011-09-05 ENCOUNTER — Other Ambulatory Visit: Payer: Self-pay | Admitting: Family Medicine

## 2011-09-05 ENCOUNTER — Other Ambulatory Visit: Payer: Self-pay | Admitting: Internal Medicine

## 2011-09-05 NOTE — Telephone Encounter (Signed)
Refill for 6 months. 

## 2011-10-03 ENCOUNTER — Encounter: Payer: Self-pay | Admitting: Gastroenterology

## 2011-12-17 ENCOUNTER — Encounter: Payer: Self-pay | Admitting: Gastroenterology

## 2011-12-20 ENCOUNTER — Other Ambulatory Visit: Payer: Self-pay | Admitting: Family Medicine

## 2012-01-11 ENCOUNTER — Encounter: Payer: Self-pay | Admitting: Family Medicine

## 2012-01-11 ENCOUNTER — Ambulatory Visit (INDEPENDENT_AMBULATORY_CARE_PROVIDER_SITE_OTHER): Payer: PRIVATE HEALTH INSURANCE | Admitting: Family Medicine

## 2012-01-11 VITALS — BP 170/88 | Temp 97.8°F | Wt 200.0 lb

## 2012-01-11 DIAGNOSIS — K439 Ventral hernia without obstruction or gangrene: Secondary | ICD-10-CM

## 2012-01-11 DIAGNOSIS — I10 Essential (primary) hypertension: Secondary | ICD-10-CM

## 2012-01-11 DIAGNOSIS — E785 Hyperlipidemia, unspecified: Secondary | ICD-10-CM

## 2012-01-11 LAB — BASIC METABOLIC PANEL
BUN: 26 mg/dL — ABNORMAL HIGH (ref 6–23)
CO2: 31 mEq/L (ref 19–32)
Chloride: 104 mEq/L (ref 96–112)
Creat: 0.86 mg/dL (ref 0.50–1.35)

## 2012-01-11 LAB — HEPATIC FUNCTION PANEL
ALT: 17 U/L (ref 0–53)
Total Protein: 6.6 g/dL (ref 6.0–8.3)

## 2012-01-11 LAB — LIPID PANEL
Cholesterol: 121 mg/dL (ref 0–200)
Triglycerides: 115 mg/dL (ref <150)

## 2012-01-11 NOTE — Progress Notes (Signed)
  Subjective:    Patient ID: Charles Li, male    DOB: [birth year], 71 y.o.   MRN: [id number]  HPI  Medical followup. Patient has history of hypertension, hyperlipidemia, glaucoma, venous insufficiency, kidney stones. Current medications include Lotensin 20 mg twice daily, Norvasc 5 mg twice daily, aspirin one daily, Pravachol 40 mg daily and Cialis as needed. Compliant with all medications. No side effects. Blood pressures at home generally ranging 130-140 systolic. Has not checked much in recent weeks. No chest pains. No dizziness.  Reported right abdominal wall hernia. Has become more painful recently. Bulges intermittently. Requesting referral to general surgeon. Generally not limiting activities. No recent appetite or weight changes.  Patient has colonoscopy scheduled in August  Past Medical History  Diagnosis Date  . HYPERLIPIDEMIA 11/28/2007  . GLAUCOMA 12/12/2006  . 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 HYPERTENSION 12/12/2006  . 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 VENOUS INSUFFICIENCY 01/18/2010  . ALLERGIC RHINITIS 12/12/2006  . ASTHMA 11/28/2007  . HERNIA, VENTRAL 01/04/2009  . RENAL CALCULUS 03/24/2010  . SYNCOPE 06/08/2008  . Abdominal pain, epigastric 07/14/2009   Past Surgical History  Procedure Date  . Cataract extraction     bilat & glaucoma surgery (7 ophth surgeries)    reports that he quit smoking about 42 years ago. His smoking use included Cigarettes. He has a 21 pack-year smoking history. He does not have any smokeless tobacco history on file. His alcohol and drug histories not on file. family history includes Birth defects in his paternal aunt and Heart disease in his brother. Allergies  Allergen Reactions  . Erythromycin   . Penicillins   . Sulfonamide Derivatives       Review of Systems  Constitutional: Negative for fatigue.  Eyes: Negative for visual disturbance.  Respiratory: Negative for cough, chest tightness and shortness of breath.   Cardiovascular: Negative  for chest pain, palpitations and leg swelling.  Gastrointestinal: Negative for nausea, vomiting, diarrhea, constipation and blood in stool.  Neurological: Negative for dizziness, syncope, weakness, light-headedness and headaches.       Objective:   Physical Exam  Constitutional: He is oriented to person, place, and time. He appears well-developed and well-nourished.  Neck: Neck supple. No thyromegaly present.  Cardiovascular: Normal rate and regular rhythm.   Pulmonary/Chest: Effort normal and breath sounds normal. No respiratory distress. He has no wheezes. He has no rales.  Abdominal: Soft. He exhibits no mass. There is no tenderness. There is no rebound and no guarding.  Musculoskeletal: He exhibits no edema.  Lymphadenopathy:    He has no cervical adenopathy.  Neurological: He is alert and oriented to person, place, and time.  Psychiatric: He has a normal mood and affect. His behavior is normal.          Assessment & Plan:  #1 hypertension. Marginal control. Assess at home and be in touch if consistently over 140/90. Routine followup 6 months #2 hyperlipidemia. Check lipid and hepatic panel. Pravachol 40 mg daily #3 probable right abdominal wall hernia. Becoming more symptomatic. General surgery referral.

## 2012-01-14 NOTE — Progress Notes (Signed)
Quick Note:  Pt informed on personally identified VM ______ 

## 2012-01-21 ENCOUNTER — Ambulatory Visit: Payer: PRIVATE HEALTH INSURANCE | Admitting: Family Medicine

## 2012-01-25 ENCOUNTER — Ambulatory Visit (AMBULATORY_SURGERY_CENTER): Payer: PRIVATE HEALTH INSURANCE | Admitting: *Deleted

## 2012-01-25 VITALS — Ht 70.0 in | Wt 198.7 lb

## 2012-01-25 DIAGNOSIS — Z1211 Encounter for screening for malignant neoplasm of colon: Secondary | ICD-10-CM

## 2012-01-25 MED ORDER — MOVIPREP 100 G PO SOLR: ORAL | Status: DC

## 2012-01-31 ENCOUNTER — Other Ambulatory Visit: Payer: Self-pay | Admitting: Family Medicine

## 2012-02-08 ENCOUNTER — Ambulatory Visit (AMBULATORY_SURGERY_CENTER): Payer: PRIVATE HEALTH INSURANCE | Admitting: Gastroenterology

## 2012-02-08 ENCOUNTER — Encounter: Payer: Self-pay | Admitting: Gastroenterology

## 2012-02-08 VITALS — BP 120/73 | HR 61 | Temp 96.5°F | Resp 20 | Ht 70.0 in | Wt 198.0 lb

## 2012-02-08 DIAGNOSIS — D126 Benign neoplasm of colon, unspecified: Secondary | ICD-10-CM

## 2012-02-08 DIAGNOSIS — Z1211 Encounter for screening for malignant neoplasm of colon: Secondary | ICD-10-CM

## 2012-02-08 MED ORDER — SODIUM CHLORIDE 0.9 % IV SOLN: 500.0000 mL | INTRAVENOUS | Status: DC

## 2012-02-08 NOTE — Patient Instructions (Signed)
Hold aspirin, aspirin products, and anti-inflammatory medications for 2 weeks!! Colon polyp removed today. Await pathology results. Result letter mailed to you in 1-3 weeks. Repeat colonoscopy per result letter.     YOU HAD AN ENDOSCOPIC PROCEDURE TODAY AT THE [facility]: Refer to the procedure report that was given to you for any specific questions about what was found during the examination.  If the procedure report does not answer your questions, please call your gastroenterologist to clarify.  If you requested that your care partner not be given the details of your procedure findings, then the procedure report has been included in a sealed envelope for you to review at your convenience later.  YOU SHOULD EXPECT: Some feelings of bloating in the abdomen. Passage of more gas than usual.  Walking can help get rid of the air that was put into your GI tract during the procedure and reduce the bloating. If you had a lower endoscopy (such as a colonoscopy or flexible sigmoidoscopy) you may notice spotting of blood in your stool or on the toilet paper. If you underwent a bowel prep for your procedure, then you may not have a normal bowel movement for a few days.  DIET: Your first meal following the procedure should be a light meal and then it is ok to progress to your normal diet.  A half-sandwich or bowl of soup is an example of a good first meal.  Heavy or fried foods are harder to digest and may make you feel nauseous or bloated.  Likewise meals heavy in dairy and vegetables can cause extra gas to form and this can also increase the bloating.  Drink plenty of fluids but you should avoid alcoholic beverages for 24 hours.  ACTIVITY: Your care partner should take you home directly after the procedure.  You should plan to take it easy, moving slowly for the rest of the day.  You can resume normal activity the day after the procedure however you should NOT DRIVE or use heavy machinery for 24 hours  (because of the sedation medicines used during the test).    SYMPTOMS TO REPORT IMMEDIATELY: A gastroenterologist can be reached at any hour.  During normal business hours, 8:30 AM to 5:00 PM Monday through Friday, call [phone].  After hours and on weekends, please call the GI answering service at [phone] who will take a message and have the physician on call contact you.   Following lower endoscopy (colonoscopy or flexible sigmoidoscopy):  Excessive amounts of blood in the stool  Significant tenderness or worsening of abdominal pains  Swelling of the abdomen that is new, acute  Fever of 100F or higher  FOLLOW UP: If any biopsies were taken you will be contacted by phone or by letter within the next 1-3 weeks.  Call your gastroenterologist if you have not heard about the biopsies in 3 weeks.  Our staff will call the home number listed on your records the next business day following your procedure to check on you and address any questions or concerns that you may have at that time regarding the information given to you following your procedure. This is a courtesy call and so if there is no answer at the home number and we have not heard from you through the emergency physician on call, we will assume that you have returned to your regular daily activities without incident.  SIGNATURES/CONFIDENTIALITY: You and/or your care partner have signed paperwork which will be entered into your  electronic medical record.  These signatures attest to the fact that that the information above on your After Visit Summary has been reviewed and is understood.  Full responsibility of the confidentiality of this discharge information lies with you and/or your care-partner.

## 2012-02-08 NOTE — Op Note (Signed)
 [facility] [address] [place], [place]  [address]  COLONOSCOPY PROCEDURE REPORT  PATIENT:  Charles Li, Charles Li  MR#:  [id number] BIRTHDATE:  05-Jan-1941, 71 yrs. old  GENDER:  male ENDOSCOPIST:  Judie Petit T. Russella Dar, MD, [facility]  PROCEDURE DATE:  02/08/2012 PROCEDURE:  Colonoscopy with snare polypectomy ASA CLASS:  Class II INDICATIONS:  1) Routine Risk Screening MEDICATIONS:   MAC sedation, administered by CRNA, propofol (Diprivan) 230 mg IV DESCRIPTION OF PROCEDURE:   After the risks benefits and alternatives of the procedure were thoroughly explained, informed consent was obtained.  Digital rectal exam was performed and revealed no abnormalities.   The LB CF-[id number] endoscope was introduced through the anus and advanced to the cecum, which was identified by both the appendix and ileocecal valve, without limitations.  The quality of the prep was good, using MoviPrep. The instrument was then slowly withdrawn as the colon was fully examined. <<PROCEDUREIMAGES>> FINDINGS:  A sessile polyp was found in the ascending colon. It was 12 mm in size. Polyp was snared, then cauterized with monopolar cautery. Retrieval was successful. Piecemeal polypectomy.  Otherwise normal colonoscopy without other polyps, masses, vascular ectasias, or inflammatory changes.   Retroflexed views in the rectum revealed no abnormalities.  The time to cecum =  3.25  minutes. The scope was then withdrawn (time =  9  min) from the patient and the procedure completed.  COMPLICATIONS:  None  ENDOSCOPIC IMPRESSION: 1) 12 mm sessile polyp in the ascending colon  RECOMMENDATIONS: 1) Hold aspirin, aspirin products, and anti-inflammatory medication for 2 weeks. 2) Await pathology results 3) Repeat Colonoscopy in 1 year if polyp is adenomatous, otherwise 10 years.  Venita Lick. Russella Dar, MD, [facility]  n. eSIGNED:   Venita Lick. Tomicka Lover at 02/08/2012 10:48 AM  Bonney Roussel, [id number]

## 2012-02-08 NOTE — Progress Notes (Signed)
Patient did not experience any of the following events: a burn prior to discharge; a fall within the facility; wrong site/side/patient/procedure/implant event; or a hospital transfer or hospital admission upon discharge from the facility. (G8907) Patient did not have preoperative order for IV antibiotic SSI prophylaxis. (G8918)  

## 2012-02-11 ENCOUNTER — Telehealth: Payer: Self-pay

## 2012-02-11 NOTE — Telephone Encounter (Signed)
  Follow up Call-  Call back number 02/08/2012  Post procedure Call Back phone  # [phone]  Permission to leave phone message Yes     Patient questions:  Do you have a fever, pain , or abdominal swelling? no Pain Score  0 *  Have you tolerated food without any problems? yes  Have you been able to return to your normal activities? yes  Do you have any questions about your discharge instructions: Diet   no Medications  no Follow up visit  no  Do you have questions or concerns about your Care? no  Actions: * If pain score is 4 or above: No action needed, pain <4.

## 2012-02-12 ENCOUNTER — Encounter: Payer: Self-pay | Admitting: Gastroenterology

## 2012-03-20 ENCOUNTER — Encounter (INDEPENDENT_AMBULATORY_CARE_PROVIDER_SITE_OTHER): Payer: Self-pay | Admitting: Surgery

## 2012-03-20 ENCOUNTER — Ambulatory Visit (INDEPENDENT_AMBULATORY_CARE_PROVIDER_SITE_OTHER): Payer: PRIVATE HEALTH INSURANCE | Admitting: Surgery

## 2012-03-20 ENCOUNTER — Encounter (INDEPENDENT_AMBULATORY_CARE_PROVIDER_SITE_OTHER): Payer: Self-pay | Admitting: General Surgery

## 2012-03-20 VITALS — BP 152/86 | HR 60 | Temp 97.4°F | Resp 16 | Ht 70.0 in | Wt 195.5 lb

## 2012-03-20 DIAGNOSIS — K439 Ventral hernia without obstruction or gangrene: Secondary | ICD-10-CM | POA: Insufficient documentation

## 2012-03-20 NOTE — Progress Notes (Addendum)
Re:   Charles Li DOB:   1941-04-25 MRN:   [id number]  ASSESSMENT AND PLAN: 1.  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  He could not show it to me today.  I discussed the indications and complications of hernia surgery with the patient.  I discussed both the laparoscopic and open approach to hernia repair..  The potential risks of hernia surgery include, but are not limited to, bleeding, infection, open surgery, nerve injury, and recurrence of the hernia.  I provided the patient literature about hernia surgery.  He wants to go ahead and schedule surgery instead of doing other test.  I talked about doing primarily the laparoscopic approach.  2.  Hypertension 3.  Hypercholesterolemia 4.  History of nephrolithiasis.  Right kidney. 5.  Glaucoma 6.  Venous insuffciency 7.  Bilateral inguinal hernias on CT scan 02/05/2009.  He does have a R>L inguinal hernia.  Both are asymptomatic.  [Mr. Trebilcock had some right groin pain/bulge helping his daughter move boxes in [place].  He called and now wants the inguinal hernias repaired at the same time as the Spigellian hernia.  I discussed with him the risks of the procedure.  I think that these can be done at the same time, but will increase surgery time by one to 2 hours.   DN  04/02/2012]  8.  Diverticulosis by CT scan - 01/08/2009.  Chief Complaint  Patient presents with  . Umbilical Hernia    Abdominal wall   REFERRING PHYSICIAN: Kristian Covey, MD  HISTORY OF PRESENT ILLNESS: Charles Li is a 71 y.o. (DOB: 1941-04-27)  white male whose primary care physician is Kristian Covey, MD and comes to me today for ventral hernia.  He has noticed a bulge in his right mid abdomen for several years.  In fact, he saw Dr. Donell Beers in July 2010 for the same hernia.  She also thought that he had a Spigelian hernia and she felt it.  Sometimes the hernia hurts and he has to bend over to get it to get it back in.  His wife has not seen the  hernia, but confirms his discomfort.  He has no other GI disease.    I reviewed with him that his CT scan in 01/2009 showed bilateral inguinal hernias, but he is unaware of these.  I discussed with him that the CT scan in 2010 did not show a Spigelian hernia, but there are limits to the CT scan.  I offered three options:  1) continue to observe, 2) repeat CT scan (but it was not seen on the last CT scan), and 3) go to surgery.    Past Medical History  Diagnosis Date  . HYPERLIPIDEMIA 11/28/2007  . GLAUCOMA 12/12/2006  . 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 HYPERTENSION 12/12/2006  . 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 VENOUS INSUFFICIENCY 01/18/2010  . ALLERGIC RHINITIS 12/12/2006  . ASTHMA 11/28/2007  . HERNIA, VENTRAL 01/04/2009  . RENAL CALCULUS 03/24/2010  . SYNCOPE 06/08/2008  . Abdominal pain, epigastric 07/14/2009  . Leg swelling   . Visual disturbance   . Abdominal wall hernia       Past Surgical History  Procedure Date  . Cataract extraction 2009 - approximate    bilat & glaucoma surgery (7 ophth surgeries)      Current Outpatient Prescriptions  Medication Sig Dispense Refill  . aspirin 81 MG EC tablet Take 81 mg by mouth daily.        . B Complex Vitamins (B  COMPLEX 1 PO) Take by mouth daily.      . Cholecalciferol (VITAMIN D PO) Take by mouth daily.      Marland Kitchen LOTENSIN 20 MG tablet TAKE 1 TABLET TWICE DAILY.  180 each  3  . Multiple Vitamin (MULTIVITAMIN) tablet Take 1 tablet by mouth daily.      . naproxen sodium (ANAPROX) 220 MG tablet Take 220 mg by mouth 2 (two) times daily with a meal.      . NORVASC 5 MG tablet TAKE 1 TABLET TWICE DAILY.  90 each  3  . Omega-3 Fatty Acids (FISH OIL PO) Take by mouth daily.      Marland Kitchen PRAVACHOL 40 MG tablet TAKE ONE TABLET AT BEDTIME.  90 each  3  . tadalafil (CIALIS) 20 MG tablet Take 20 mg by mouth daily as needed.        Marland Kitchen TIMOPTIC OCUDOSE 0.5 % SOLN 1 drop 2 (two) times daily.       . TRAVATAN Z 0.004 % SOLN ophthalmic solution Place 1 drop into  the right eye at bedtime.           Allergies  Allergen Reactions  . Erythromycin Rash  . Penicillins Rash  . Sulfonamide Derivatives Rash    REVIEW OF SYSTEMS: Skin:  No history of rash.  No history of abnormal moles. Infection:  No history of hepatitis or HIV.  No history of MRSA. Neurologic:  No history of stroke.  No history of seizure.  No history of headaches. Cardiac:  Hypertension for years.  No cardiac eval. Pulmonary:  Does not smoke cigarettes.  No asthma or bronchitis.  No OSA/CPAP.  Endocrine:  No diabetes. No thyroid disease.  Hypercholesterolemia. Gastrointestinal:  No history of stomach disease.  No history of liver disease.  No history of gall bladder disease.  No history of pancreas disease.  No history of colon disease.  Colonoscopy last week by Dr. Russella Dar.  Dr. Russella Dar found a polyp and suggested 1 year follow up. Urologic:  History of kidney stones -2010. Musculoskeletal:  No history of joint or back disease. Hematologic:  No bleeding disorder.  No history of anemia.  Not anticoagulated. Psycho-social:  The patient is oriented.   The patient has no obvious psychologic or social impairment to understanding our conversation and plan.  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 Married.  Wife is with him.  PHYSICAL EXAM: BP 152/86  Pulse 60  Temp 97.4 F (36.3 C) (Temporal)  Resp 16  Ht 5\' 10"  (1.778 m)  Wt 195 lb 8 oz (88.678 kg)  BMI 28.05 kg/m2  General: WN WM who is alert and generally healthy appearing. Some trouble with vision. HEENT: Normal. Pupils equal. Neck: Supple. No mass.  No thyroid mass. Lymph Nodes:  No supraclavicular or cervical nodes. Lungs: Clear to auscultation and symmetric breath sounds. Heart:  RRR. No murmur or rub.  Abdomen: Soft. No mass. No tenderness.  Normal bowel sounds.  No abdominal scars.  Small to medium right inguinal hernia and maybe a small left inguinal hernia.  He cannot  reproduce the abdominal wall hernia, but he is pointing where I would expect a right Spigelian hernia. Rectal: Not done. Extremities:  Good strength and ROM  in upper and lower extremities.  Venous stasis changes of lower legs. Neurologic:  Grossly intact to motor and sensory function. Psychiatric: Has normal mood and affect. Behavior is normal.   DATA REVIEWED: Notes in chart. CT  scan report 02/05/2009 given to patient. I reviewed the 2010 CT scan and I cannot see evidence of a Spigelian hernia.  Ovidio Kin, MD,  [facility] [address] [place]  [address]   [place], [place]    [address] Phone:  [phone] FAX:  [phone]

## 2012-03-26 ENCOUNTER — Telehealth (INDEPENDENT_AMBULATORY_CARE_PROVIDER_SITE_OTHER): Payer: Self-pay

## 2012-03-26 NOTE — Telephone Encounter (Signed)
Pt called c/o of pain in the right groin.  He is due to be scheduled for ventral hernia repair, but does have bilateral inguinal hernias which have not been symptomatic.  Over the weekend he did some heavy lifting and now that pain is worse than the ventral hernia.  He would like to have all repaired at the time of surgery if possible.  I told him I would pass the message on to Dr. Ezzard Standing and his nurse.

## 2012-04-02 ENCOUNTER — Other Ambulatory Visit (INDEPENDENT_AMBULATORY_CARE_PROVIDER_SITE_OTHER): Payer: Self-pay | Admitting: Surgery

## 2012-04-18 ENCOUNTER — Encounter (HOSPITAL_COMMUNITY): Payer: Self-pay

## 2012-04-22 NOTE — Progress Notes (Signed)
Dr. Ezzard Standing,         Charles Li is coming to [facility] on Thurs  10/17  At 8:30 am for his preop appt--his surgery date is 10/25. Please enter his preop orders is EPIC.                  Thanks.                       Antonietta Breach RN

## 2012-04-23 ENCOUNTER — Other Ambulatory Visit (INDEPENDENT_AMBULATORY_CARE_PROVIDER_SITE_OTHER): Payer: Self-pay | Admitting: Surgery

## 2012-04-24 ENCOUNTER — Encounter (HOSPITAL_COMMUNITY): Payer: Self-pay

## 2012-04-24 ENCOUNTER — Ambulatory Visit (HOSPITAL_COMMUNITY)
Admission: RE | Admit: 2012-04-24 | Discharge: 2012-04-24 | Disposition: A | Discharge status: Home / Self Care | Payer: PRIVATE HEALTH INSURANCE | Source: Ambulatory Visit | Attending: Surgery | Admitting: Surgery

## 2012-04-24 ENCOUNTER — Encounter (HOSPITAL_COMMUNITY)
Admission: RE | Admit: 2012-04-24 | Discharge: 2012-04-24 | Disposition: A | Discharge status: Home / Self Care | Payer: PRIVATE HEALTH INSURANCE | Source: Ambulatory Visit | Attending: Surgery | Admitting: Surgery

## 2012-04-24 DIAGNOSIS — I440 Atrioventricular block, first degree: Secondary | ICD-10-CM | POA: Insufficient documentation

## 2012-04-24 DIAGNOSIS — Z01818 Encounter for other preprocedural examination: Secondary | ICD-10-CM | POA: Insufficient documentation

## 2012-04-24 DIAGNOSIS — I498 Other specified cardiac arrhythmias: Secondary | ICD-10-CM | POA: Insufficient documentation

## 2012-04-24 DIAGNOSIS — Z01810 Encounter for preprocedural cardiovascular examination: Secondary | ICD-10-CM | POA: Insufficient documentation

## 2012-04-24 DIAGNOSIS — H539 Unspecified visual disturbance: Secondary | ICD-10-CM

## 2012-04-24 DIAGNOSIS — M7989 Other specified soft tissue disorders: Secondary | ICD-10-CM

## 2012-04-24 DIAGNOSIS — Z01812 Encounter for preprocedural laboratory examination: Secondary | ICD-10-CM | POA: Insufficient documentation

## 2012-04-24 DIAGNOSIS — K439 Ventral hernia without obstruction or gangrene: Secondary | ICD-10-CM | POA: Insufficient documentation

## 2012-04-24 HISTORY — DX: Other specified soft tissue disorders: M79.89

## 2012-04-24 HISTORY — DX: Unspecified visual disturbance: H53.9

## 2012-04-24 HISTORY — PX: VASECTOMY: SHX75

## 2012-04-24 LAB — BASIC METABOLIC PANEL
GFR calc Af Amer: >90 mL/min (ref >90)
GFR calc non Af Amer: >90 mL/min (ref >90)
Potassium: 4.5 mEq/L (ref 3.5–5.1)
Sodium: 138 mEq/L (ref 135–145)

## 2012-04-24 LAB — SURGICAL PCR SCREEN
MRSA, PCR: NEGATIVE
Staphylococcus aureus: NEGATIVE

## 2012-04-24 LAB — CBC
MCHC: 35.5 g/dL (ref 30.0–36.0)
RDW: 12.5 % (ref 11.5–15.5)

## 2012-04-24 NOTE — Patient Instructions (Signed)
20 JAIREN GOLDFARB  04/24/2012   Your procedure is scheduled on:  10-25 -2013  Report to [facility] at      0530  AM.  Call this number if you have problems the morning of surgery: [phone]  Or Presurgical Testing [phone](Kealy Lewter)   Remember:   Do not eat food:After Midnight.   Take these medicines the morning of surgery with A SIP OF WATER: Amlodipine. Use and bring eyedrops.   Do not wear jewelry, make-up or nail polish.  Do not wear lotions, powders, or perfumes. You may wear deodorant.  Do not shave 48 hours prior to surgery.(face and neck okay, no shaving of legs)  Do not bring valuables to the hospital.  Contacts, dentures or bridgework may not be worn into surgery.  Leave suitcase in the car. After surgery it may be brought to your room.  For patients admitted to the hospital, checkout time is 11:00 AM the day of discharge.   Patients discharged the day of surgery will not be allowed to drive home. Must have responsible person with you x 24 hours once discharged.  Name and phone number of your driver: Dionysios Massman -spouse [phone]  Special Instructions: CHG Shower Use Special Wash: see special instructions sheet .(avoid face and genitals)   Please read over the following fact sheets that you were given: MRSA Information.

## 2012-05-02 ENCOUNTER — Ambulatory Visit (HOSPITAL_COMMUNITY)
Admission: RE | Admit: 2012-05-02 | Discharge: 2012-05-03 | Disposition: A | Discharge status: Home / Self Care | Payer: Medicare Other | Source: Ambulatory Visit | Attending: Surgery | Admitting: Surgery

## 2012-05-02 ENCOUNTER — Encounter (HOSPITAL_COMMUNITY): Payer: Self-pay | Admitting: Anesthesiology

## 2012-05-02 ENCOUNTER — Ambulatory Visit (HOSPITAL_COMMUNITY): Payer: Medicare Other | Admitting: Anesthesiology

## 2012-05-02 ENCOUNTER — Encounter (HOSPITAL_COMMUNITY): Payer: Self-pay | Admitting: *Deleted

## 2012-05-02 ENCOUNTER — Encounter (HOSPITAL_COMMUNITY)
Admission: RE | Disposition: A | Discharge status: Home / Self Care | Payer: Self-pay | Source: Ambulatory Visit | Attending: Surgery

## 2012-05-02 DIAGNOSIS — K439 Ventral hernia without obstruction or gangrene: Secondary | ICD-10-CM

## 2012-05-02 DIAGNOSIS — Z79899 Other long term (current) drug therapy: Secondary | ICD-10-CM | POA: Insufficient documentation

## 2012-05-02 DIAGNOSIS — I10 Essential (primary) hypertension: Secondary | ICD-10-CM | POA: Insufficient documentation

## 2012-05-02 DIAGNOSIS — K4020 Bilateral inguinal hernia, without obstruction or gangrene, not specified as recurrent: Secondary | ICD-10-CM | POA: Insufficient documentation

## 2012-05-02 DIAGNOSIS — Z7982 Long term (current) use of aspirin: Secondary | ICD-10-CM | POA: Insufficient documentation

## 2012-05-02 DIAGNOSIS — E7800 Pure hypercholesterolemia, unspecified: Secondary | ICD-10-CM | POA: Insufficient documentation

## 2012-05-02 HISTORY — PX: VENTRAL HERNIA REPAIR: SHX424

## 2012-05-02 HISTORY — PX: INGUINAL HERNIA REPAIR: SHX194

## 2012-05-02 SURGERY — REPAIR, HERNIA, VENTRAL, LAPAROSCOPIC
Anesthesia: General | Site: Abdomen | Wound class: Clean

## 2012-05-02 MED ORDER — HYDROMORPHONE HCL PF 1 MG/ML IJ SOLN
INTRAMUSCULAR | Status: AC
  Filled 2012-05-02: qty 1

## 2012-05-02 MED ORDER — HYDROMORPHONE HCL PF 1 MG/ML IJ SOLN
0.2500 mg | INTRAMUSCULAR | Status: DC | PRN
  Administered 2012-05-02 (×2): 0.5 mg via INTRAVENOUS

## 2012-05-02 MED ORDER — ONDANSETRON HCL 4 MG/2ML IJ SOLN: 4.0000 mg | Freq: Four times a day (QID) | INTRAMUSCULAR | Status: DC | PRN

## 2012-05-02 MED ORDER — LACTATED RINGERS IR SOLN
Status: DC | PRN
  Administered 2012-05-02: 1

## 2012-05-02 MED ORDER — NEOSTIGMINE METHYLSULFATE 1 MG/ML IJ SOLN
INTRAMUSCULAR | Status: DC | PRN
  Administered 2012-05-02: 4 mg via INTRAVENOUS

## 2012-05-02 MED ORDER — MORPHINE SULFATE 2 MG/ML IJ SOLN
1.0000 mg | INTRAMUSCULAR | Status: DC | PRN
  Administered 2012-05-02: 2 mg via INTRAVENOUS
  Filled 2012-05-02: qty 1

## 2012-05-02 MED ORDER — AMLODIPINE BESYLATE 5 MG PO TABS
5.0000 mg | ORAL_TABLET | Freq: Two times a day (BID) | ORAL | Status: DC
  Administered 2012-05-02: 5 mg via ORAL
  Filled 2012-05-02 (×3): qty 1

## 2012-05-02 MED ORDER — TIMOLOL MALEATE 0.5 % OP SOLN
1.0000 [drp] | Freq: Two times a day (BID) | OPHTHALMIC | Status: DC
  Administered 2012-05-02: 1 [drp] via OPHTHALMIC
  Filled 2012-05-02: qty 5

## 2012-05-02 MED ORDER — BUPIVACAINE-EPINEPHRINE 0.5% -1:200000 IJ SOLN
INTRAMUSCULAR | Status: DC | PRN
  Administered 2012-05-02: 30 mL

## 2012-05-02 MED ORDER — DEXAMETHASONE SODIUM PHOSPHATE 10 MG/ML IJ SOLN
INTRAMUSCULAR | Status: DC | PRN
  Administered 2012-05-02: 10 mg via INTRAVENOUS

## 2012-05-02 MED ORDER — TRAVOPROST (BAK FREE) 0.004 % OP SOLN
1.0000 [drp] | Freq: Every day | OPHTHALMIC | Status: DC
  Administered 2012-05-02: 1 [drp] via OPHTHALMIC
  Filled 2012-05-02: qty 2.5

## 2012-05-02 MED ORDER — HEPARIN SODIUM (PORCINE) 5000 UNIT/ML IJ SOLN
5000.0000 [IU] | Freq: Three times a day (TID) | INTRAMUSCULAR | Status: DC
  Administered 2012-05-02 – 2012-05-03 (×2): 5000 [IU] via SUBCUTANEOUS
  Filled 2012-05-02 (×5): qty 1

## 2012-05-02 MED ORDER — CEFAZOLIN SODIUM-DEXTROSE 2-3 GM-% IV SOLR
INTRAVENOUS | Status: AC
  Filled 2012-05-02: qty 50

## 2012-05-02 MED ORDER — ROCURONIUM BROMIDE 100 MG/10ML IV SOLN
INTRAVENOUS | Status: DC | PRN
  Administered 2012-05-02: 5 mg via INTRAVENOUS
  Administered 2012-05-02: 50 mg via INTRAVENOUS

## 2012-05-02 MED ORDER — BUPIVACAINE-EPINEPHRINE (PF) 0.5% -1:200000 IJ SOLN
INTRAMUSCULAR | Status: AC
  Filled 2012-05-02: qty 10

## 2012-05-02 MED ORDER — ONDANSETRON HCL 4 MG/2ML IJ SOLN
INTRAMUSCULAR | Status: DC | PRN
  Administered 2012-05-02: 4 mg via INTRAVENOUS

## 2012-05-02 MED ORDER — LACTATED RINGERS IV SOLN
INTRAVENOUS | Status: DC | PRN
  Administered 2012-05-02 (×2): via INTRAVENOUS

## 2012-05-02 MED ORDER — POTASSIUM CHLORIDE IN NACL 20-0.45 MEQ/L-% IV SOLN
INTRAVENOUS | Status: DC
  Administered 2012-05-02 (×2): via INTRAVENOUS
  Filled 2012-05-02 (×6): qty 1000

## 2012-05-02 MED ORDER — 0.9 % SODIUM CHLORIDE (POUR BTL) OPTIME
TOPICAL | Status: DC | PRN
  Administered 2012-05-02: 1000 mL

## 2012-05-02 MED ORDER — PROMETHAZINE HCL 25 MG/ML IJ SOLN: 6.2500 mg | INTRAMUSCULAR | Status: DC | PRN

## 2012-05-02 MED ORDER — CHLORHEXIDINE GLUCONATE 4 % EX LIQD: 1.0000 "application " | Freq: Once | CUTANEOUS | Status: DC

## 2012-05-02 MED ORDER — ONDANSETRON HCL 4 MG PO TABS: 4.0000 mg | ORAL_TABLET | Freq: Four times a day (QID) | ORAL | Status: DC | PRN

## 2012-05-02 MED ORDER — GLYCOPYRROLATE 0.2 MG/ML IJ SOLN
INTRAMUSCULAR | Status: DC | PRN
  Administered 2012-05-02: .6 mg via INTRAVENOUS

## 2012-05-02 MED ORDER — ACETAMINOPHEN 10 MG/ML IV SOLN
INTRAVENOUS | Status: DC | PRN
  Administered 2012-05-02: 1000 mg via INTRAVENOUS

## 2012-05-02 MED ORDER — HYDROCODONE-ACETAMINOPHEN 5-325 MG PO TABS: 1.0000 | ORAL_TABLET | ORAL | Status: DC | PRN

## 2012-05-02 MED ORDER — FENTANYL CITRATE 0.05 MG/ML IJ SOLN
INTRAMUSCULAR | Status: DC | PRN
  Administered 2012-05-02: 100 ug via INTRAVENOUS
  Administered 2012-05-02: 25 ug via INTRAVENOUS
  Administered 2012-05-02 (×2): 50 ug via INTRAVENOUS
  Administered 2012-05-02 (×3): 25 ug via INTRAVENOUS

## 2012-05-02 MED ORDER — ACETAMINOPHEN 10 MG/ML IV SOLN
INTRAVENOUS | Status: AC
  Filled 2012-05-02: qty 100

## 2012-05-02 MED ORDER — CEFAZOLIN SODIUM-DEXTROSE 2-3 GM-% IV SOLR
2.0000 g | INTRAVENOUS | Status: AC
  Administered 2012-05-02: 2 g via INTRAVENOUS

## 2012-05-02 MED ORDER — BENAZEPRIL HCL 20 MG PO TABS
20.0000 mg | ORAL_TABLET | Freq: Two times a day (BID) | ORAL | Status: DC
  Administered 2012-05-02: 20 mg via ORAL
  Filled 2012-05-02 (×3): qty 1

## 2012-05-02 MED ORDER — ACETAMINOPHEN 10 MG/ML IV SOLN
1000.0000 mg | Freq: Four times a day (QID) | INTRAVENOUS | Status: DC
  Administered 2012-05-02 – 2012-05-03 (×3): 1000 mg via INTRAVENOUS
  Filled 2012-05-02 (×5): qty 100

## 2012-05-02 MED ORDER — LACTATED RINGERS IV SOLN: INTRAVENOUS | Status: DC

## 2012-05-02 MED ORDER — MIDAZOLAM HCL 5 MG/5ML IJ SOLN
INTRAMUSCULAR | Status: DC | PRN
  Administered 2012-05-02 (×2): 1 mg via INTRAVENOUS

## 2012-05-02 MED ORDER — PROPOFOL 10 MG/ML IV BOLUS
INTRAVENOUS | Status: DC | PRN
  Administered 2012-05-02: 140 mg via INTRAVENOUS

## 2012-05-02 SURGICAL SUPPLY — 55 items
ADH SKN CLS APL DERMABOND .7 (GAUZE/BANDAGES/DRESSINGS) ×1
APL SKNCLS STERI-STRIP NONHPOA (GAUZE/BANDAGES/DRESSINGS) ×1
BENZOIN TINCTURE PRP APPL 2/3 (GAUZE/BANDAGES/DRESSINGS) ×2 IMPLANT
BINDER ABD UNIV 12 45-62 (WOUND CARE) ×2 IMPLANT
BINDER ABDOMINAL 46IN 62IN (WOUND CARE) ×4
CANISTER SUCTION 2500CC (MISCELLANEOUS) ×2 IMPLANT
CLOTH BEACON ORANGE TIMEOUT ST (SAFETY) ×2 IMPLANT
DECANTER SPIKE VIAL GLASS SM (MISCELLANEOUS) ×2 IMPLANT
DERMABOND ADVANCED (GAUZE/BANDAGES/DRESSINGS) ×1
DERMABOND ADVANCED .7 DNX12 (GAUZE/BANDAGES/DRESSINGS) ×1 IMPLANT
DEVICE SECURE STRAP 25 ABSORB (INSTRUMENTS) ×2 IMPLANT
DEVICE TROCAR PUNCTURE CLOSURE (ENDOMECHANICALS) ×2 IMPLANT
DISSECT BALLN SPACEMKR + OVL (BALLOONS) ×2
DISSECTOR BALLN SPACEMKR + OVL (BALLOONS) ×1 IMPLANT
DISSECTOR BLUNT TIP ENDO 5MM (MISCELLANEOUS) IMPLANT
DRAIN CHANNEL RND F F (WOUND CARE) IMPLANT
DRAPE INCISE IOBAN 66X45 STRL (DRAPES) ×2 IMPLANT
DRAPE LAPAROSCOPIC ABDOMINAL (DRAPES) ×2 IMPLANT
DRAPE WARM FLUID 44X44 (DRAPE) ×2 IMPLANT
ELECT REM PT RETURN 9FT ADLT (ELECTROSURGICAL) ×2
ELECTRODE REM PT RTRN 9FT ADLT (ELECTROSURGICAL) ×1 IMPLANT
EVACUATOR SILICONE 100CC (DRAIN) IMPLANT
GLOVE BIOGEL PI IND STRL 7.0 (GLOVE) ×1 IMPLANT
GLOVE BIOGEL PI INDICATOR 7.0 (GLOVE) ×1
GLOVE SURG SIGNA 7.5 PF LTX (GLOVE) ×2 IMPLANT
GOWN STRL NON-REIN LRG LVL3 (GOWN DISPOSABLE) ×2 IMPLANT
GOWN STRL REIN XL XLG (GOWN DISPOSABLE) ×4 IMPLANT
KIT BASIN OR (CUSTOM PROCEDURE TRAY) ×2 IMPLANT
MESH C-QUR LITE 10.5X14.6CM (Mesh General) ×4 IMPLANT
MESH PARIETEX 4.7 (Mesh General) ×2 IMPLANT
NEEDLE INSUFFLATION 14GA 150MM (NEEDLE) IMPLANT
NEEDLE SPNL 22GX3.5 QUINCKE BK (NEEDLE) ×2 IMPLANT
NS IRRIG 1000ML POUR BTL (IV SOLUTION) ×2 IMPLANT
PEN SKIN MARKING BROAD (MISCELLANEOUS) ×2 IMPLANT
PENCIL BUTTON HOLSTER BLD 10FT (ELECTRODE) ×2 IMPLANT
SCALPEL HARMONIC ACE (MISCELLANEOUS) ×2 IMPLANT
SCISSORS LAP 5X35 DISP (ENDOMECHANICALS) ×2 IMPLANT
SET IRRIG TUBING LAPAROSCOPIC (IRRIGATION / IRRIGATOR) IMPLANT
SOLUTION ANTI FOG 6CC (MISCELLANEOUS) ×2 IMPLANT
STAPLER VISISTAT 35W (STAPLE) ×2 IMPLANT
STRIP CLOSURE SKIN 1/4X4 (GAUZE/BANDAGES/DRESSINGS) ×2 IMPLANT
SUT MON AB 5-0 PS2 18 (SUTURE) ×4 IMPLANT
SUT NOVA 0 T19/GS 22DT (SUTURE) ×4 IMPLANT
SUT VIC AB 3-0 SH 18 (SUTURE) ×2 IMPLANT
SUT VIC AB 5-0 PS2 18 (SUTURE) ×4 IMPLANT
TACKER 5MM HERNIA 3.5CML NAB (ENDOMECHANICALS) ×2 IMPLANT
TOWEL OR 17X26 10 PK STRL BLUE (TOWEL DISPOSABLE) ×2 IMPLANT
TRAY FOLEY CATH 14FRSI W/METER (CATHETERS) ×2 IMPLANT
TRAY LAP CHOLE (CUSTOM PROCEDURE TRAY) ×2 IMPLANT
TROCAR BLADELESS OPT 5 75 (ENDOMECHANICALS) ×2 IMPLANT
TROCAR XCEL BLUNT TIP 100MML (ENDOMECHANICALS) ×2 IMPLANT
TROCAR XCEL NON-BLD 11X100MML (ENDOMECHANICALS) ×2 IMPLANT
TROCAR Z-THREAD FIOS 11X100 BL (TROCAR) ×2 IMPLANT
TROCAR Z-THREAD FIOS 5X100MM (TROCAR) ×4 IMPLANT
TUBING INSUFFLATION 10FT LAP (TUBING) ×2 IMPLANT

## 2012-05-02 NOTE — Anesthesia Postprocedure Evaluation (Signed)
  Anesthesia Post-op Note  Patient: Charles Li  Procedure(s) Performed: Procedure(s) (LRB): LAPAROSCOPIC VENTRAL HERNIA (N/A) INSERTION OF MESH (N/A) HERNIA REPAIR INGUINAL ADULT BILATERAL (N/A)  Patient Location: PACU  Anesthesia Type: General  Level of Consciousness: awake and alert   Airway and Oxygen Therapy: Patient Spontanous Breathing  Post-op Pain: mild  Post-op Assessment: Post-op Vital signs reviewed, Patient's Cardiovascular Status Stable, Respiratory Function Stable, Patent Airway and No signs of Nausea or vomiting  Post-op Vital Signs: stable  Complications: No apparent anesthesia complications

## 2012-05-02 NOTE — Brief Op Note (Signed)
05/02/2012  10:21 AM  PATIENT:  Charles Li, 71 y.o., male, MRN: [id number]  PREOP DIAGNOSIS:  ventral hernia  POSTOP DIAGNOSIS:   Bilateral direct inguinal hernias (R > L), Right abdominal Spigelian hernia  PROCEDURE:   Procedure(s): LAPAROSCOPIC VENTRAL HERNIA repair with INSERTION OF MESH (12 cm round Parietex), Laparoscopic extraperitoneal HERNIA REPAIR INGUINAL ADULT, BILATERAL (with pre cut Atrium mesh)  SURGEON:   Ovidio Kin, M.D.  ASSISTANT:   None  ANESTHESIA:   general  Delphia Grates, CRNA - CRNA Azell Der, MD - Anesthesiologist Randon Goldsmith - CRNA  General  EBL:  Minimal  ml  BLOOD ADMINISTERED: none  DRAINS: none   LOCAL MEDICATIONS USED:   30 cc 1/4% marcaine  SPECIMEN:   None  COUNTS CORRECT:  YES  INDICATIONS FOR PROCEDURE:  [name] is a 71 y.o. (DOB: Oct 03, 1940) white male whose primary care physician is Kristian Covey, MD and comes for repair of right abdominal Spigelian hernia and bilateral inguinal hernias.   The indications and risks of the surgery were explained to the patient.  The risks include, but are not limited to, infection, bleeding, and nerve injury.  Note dictated to:   #[id number]   Type of repair - Mesh  (choices - primary suture, mesh, or component)  Name of mesh - Parietex mesh intra-abdominal, Atrium mesh for inguinal hernias (pre-peritoneal)  Size of mesh - Length 12 cm, Width 12 cm  Mesh overlap - 5 cm  Placement of mesh - beneath fascia and into peritoneal cavity - for the Spigelian hernia  (choices - beneath fascia and into peritoneal cavity, beneath fascia but external to peritoneal cavity, between the muscle and fascia, above or external to fascia)

## 2012-05-02 NOTE — Transfer of Care (Signed)
Immediate Anesthesia Transfer of Care Note  Patient: Charles Li  Procedure(s) Performed: Procedure(s) (LRB) with comments: LAPAROSCOPIC VENTRAL HERNIA (N/A) INSERTION OF MESH (N/A) HERNIA REPAIR INGUINAL ADULT BILATERAL (N/A)  Patient Location: PACU  Anesthesia Type: General  Level of Consciousness: awake, alert  and patient cooperative  Airway & Oxygen Therapy: Patient Spontanous Breathing and Patient connected to face mask oxygen  Post-op Assessment: Report given to PACU RN and Post -op Vital signs reviewed and stable  Post vital signs: Reviewed and stable  Complications: No apparent anesthesia complications

## 2012-05-02 NOTE — Op Note (Signed)
Charles [name]:  [id number]  MEDICAL RECORD NO.:  [id number]  LOCATION:  1534                         FACILITY:  [facility]  PHYSICIAN:  Sandria Bales. Ezzard Standing, M.D.  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  PROCEDURE:  Laparoscopic bilateral inguinal hernia repair through a preperitoneal approach  (Atrium C Qur Lite pre-cut mesh), and then intra-abdominal repair of spigelian hernia (12 cm round Parietex mesh).  SURGEON:  Sandria Bales. Ezzard Standing, MD  first assistant  ANESTHESIA:  General endotracheal.  Supervised by Dr. Sherrian Divers.  ESTIMATED BLOOD LOSS:  Minimal.  Local anesthetic is 30 mL of 0.25% Marcaine.  COMPLICATIONS:  None.  INDICATION FOR PROCEDURE:  Charles Li is a 71 year old white male who sees Dr. Evelena Peat as his primary care doctor.  He has for several years noticed a bulge in his right lower quadrant in the mid abdominal area which is consistent with a spigelian hernia.  Interestingly in July of 2010, he underwent a CT scan, which did not show this defect but did show bilateral inguinal hernias.  The patient has had increasing symptoms, particularly with his right inguinal hernia in addition to the spigelian hernia.  It was interesting having all 3 hernias repaired at the same time.  The indications and complications of both abdominal wall hernia and inguinal hernias were discussed with the patient.  I discussed both laparoscopic and open approach.  I discussed potential risk of hernia repair which include, but are not limited to, bleeding, infection, open surgery, nerve injury, and recurrence of the hernia.  I also talked about  having the hernia open and then may have to fix 1 inguinal side.  OPERATIVE NOTE:  The patient was taken to room #11 where he underwent a general anesthesia supervised by Dr. Sherrian Divers.  He was given 2 g of Ancef initially in procedure.  He had a Foley catheter in place.  Both his arms were tucked and his abdomen was prepped with ChloraPrep and sterilely draped.  A time-out was held and surgical checklist run.  I first started by accessing the abdominal cavity with a 5 mm trocar in the left upper quadrant.  I inserted this into the abdominal cavity and insufflated the abdomen and did abdominal exploration.  Right and left lobes of liver unremarkable.  Stomach was unremarkable.  He did have evidence of a spigelian hernia, in the right lower abdomen laterally. The defect in the fascia was about a cm and a half.  I did take photos of this and put in the chart.  He also had evidence of bilateral inguinal hernias which appeared to be direct inguinal hernias.  Now that I had access to intra-abdominal cavity, I then went to my preperitoneal approach.  I made an infraumbilical incision with sharp dissection, carried down to the rectus fascia.  I went through the anterior rectus fascia on the right, retracted  the rectus abdominis muscle.  I used the preperitoneal balloon to dilate up the preperitoneal space.  The dissection was not very good,  it mainly dissected the right lower preperitoneal space but I got 5 mm trocar in the right lower quadrant, was able to dissect down to the left side.  I exposed both inguinal hernias areas which were indirect inguinal hernias and actually they are almost like Swiss cheese defects, particularly on the left side.  He had really no component of indirect hernia that I could see but I did skeletonize both cords.  I then used the Atrium precut mesh and placed 1 hernia patch on the left side which I secured with 11 ProTacks.  I placed the Atrium mesh on  the right side and secured with 12 ProTacks.  I did encircle the cord structures.  The patches appeared to cover the hernia defects well, recreated internal ring and laid flat.  I desufflated the abdomen once to make sure there are no problems with the mesh throwing up.  I then removed the 3 trocars.  I then re-insufflated the abdominal cavity and reinspected inside the abdomen.  I placed an additional 5 mm in the left lateral abdomen and expanded the left upper quadrant trocar to an 11 mm trocar.  It looked like the 12 cm patch would cover this defect well.  Separate 6 holding sutures with 0 Novafil and 12 cm patch introduced into the abdominal cavity.  I was able to grab these holding tacks and then I tacked up with a secure strap to the anterior abdominal wall after tieing this with 24 tacks.  The mesh lay flat and covered the defect and photos were taken of this.  The patient tolerated the procedure well.  The trocars were then removed in turn.  The umbilical port, anterior fascia was closed with 0 Vicryl suture.  The port was closed with a 5-0 Monocryl suture and the wound was then painted with Dermabond.  The patient tolerated the procedure well, was transported to the recovery room in good condition.   Sandria Bales. Ezzard Standing, M.D., FACS   DHN/MEDQ  D:  05/02/2012  T:  05/02/2012  Job:  [id number]  cc:   Evelena Peat, M.D.  Venita Lick. Russella Dar, MD, FACG [address] [place] [address]

## 2012-05-02 NOTE — Anesthesia Preprocedure Evaluation (Addendum)
Anesthesia Evaluation  Patient identified by MRN, date of birth, ID band Patient awake    Reviewed: Allergy & Precautions, H&P , NPO status , Patient's Chart, lab work & pertinent test results  Airway Mallampati: II TM Distance: >3 FB Neck ROM: Full    Dental No notable dental hx.    Pulmonary asthma ,  breath sounds clear to auscultation  Pulmonary exam normal       Cardiovascular Exercise Tolerance: Good hypertension, Pt. on medications + dysrhythmias Rhythm:Regular Rate:Normal  Syncope 06/08/08. Negative workup and thought secondary to HTN medications. Chronic Leg swelling, venous insufficiency. H/O bradycardia, even into 40's frequently.  ECG: SB 50, with 1st degree AVB   Neuro/Psych Glaucoma.negative neurological ROS     GI/Hepatic negative GI ROS, Neg liver ROS,   Endo/Other  negative endocrine ROS  Renal/GU Renal diseaseRenal calculus.  negative genitourinary   Musculoskeletal negative musculoskeletal ROS (+)   Abdominal   Peds negative pediatric ROS (+)  Hematology negative hematology ROS (+)   Anesthesia Other Findings   Reproductive/Obstetrics negative OB ROS                         Anesthesia Physical Anesthesia Plan  ASA: II  Anesthesia Plan: General   Post-op Pain Management:    Induction: Intravenous  Airway Management Planned: Oral ETT  Additional Equipment:   Intra-op Plan:   Post-operative Plan: Extubation in OR  Informed Consent: I have reviewed the patients History and Physical, chart, labs and discussed the procedure including the risks, benefits and alternatives for the proposed anesthesia with the patient or authorized representative who has indicated his/her understanding and acceptance.   Dental advisory given  Plan Discussed with: CRNA  Anesthesia Plan Comments:         Anesthesia Quick Evaluation

## 2012-05-02 NOTE — Progress Notes (Signed)
NOS  Wife in room with patient. He is doing well except for right lower abdominal wall pain (the location of the Spigelian hernia repair). He has voided a little. Not nauseated.  BP 152/80  Pulse 66  Temp 98 F (36.7 C) (Oral)  Resp 16  Ht 5\' 10"  (1.778 m)  Wt 197 lb (89.359 kg)  BMI 28.27 kg/m2  SpO2 96%  Overall doing okay.  Ovidio Kin, MD, [facility] Pager: [phone] Office phone:  [phone]

## 2012-05-02 NOTE — Preoperative (Signed)
Beta Blockers   Reason not to administer Beta Blockers:Not Applicable 

## 2012-05-02 NOTE — H&P (Signed)
Re: Charles Li  DOB: [birth year]  MRN: [id number]   ASSESSMENT AND PLAN:  1. 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   He could not show it to me today.   I discussed the indications and complications of hernia surgery with the patient. I discussed both the laparoscopic and open approach to hernia repair.. The potential risks of hernia surgery include, but are not limited to, bleeding, infection, open surgery, nerve injury, and recurrence of the hernia. I provided the patient literature about hernia surgery.   He wants to go ahead and schedule surgery instead of doing other test.   I talked about doing primarily the laparoscopic approach.   The patient wants both the inguinal hernias repaired and the right "Spigelian" hernia repaired.  I discussed the limits and the plan.  The patient's wife is here with him.  2. Hypertension  3. Hypercholesterolemia  4. History of nephrolithiasis. Right kidney.  5. Glaucoma  6. Venous insuffciency  7. Bilateral inguinal hernias on CT scan 02/05/2009.    He does have a R>L inguinal hernia. Both are asymptomatic.  [Mr. Champoux had some right groin pain/bulge helping his daughter move boxes in [place]. He called and now wants the inguinal hernias repaired at the same time as the Spigellian hernia. I discussed with him the risks of the procedure. I think that these can be done at the same time, but will increase surgery time by one to 2 hours. DN 04/02/2012]  8. Diverticulosis by CT scan - 01/08/2009.   Chief Complaint   Patient presents with   .  Umbilical Hernia     Abdominal wall    REFERRING PHYSICIAN: Kristian Covey, MD   HISTORY OF PRESENT ILLNESS:  Charles Li is a 71 y.o. (DOB: 11/07/1940) white male whose primary care physician is Kristian Covey, MD and comes to me today for ventral hernia.   He has noticed a bulge in his right mid abdomen for several years. In fact, he saw Dr. Donell Beers in July 2010 for the same hernia. She also  thought that he had a Spigelian hernia and she felt it. Sometimes the hernia hurts and he has to bend over to get it to get it back in. His wife has not seen the hernia, but confirms his discomfort. He has no other GI disease.   I reviewed with him that his CT scan in 01/2009 showed bilateral inguinal hernias, but he is unaware of these. I discussed with him that the CT scan in 2010 did not show a Spigelian hernia, but there are limits to the CT scan.  I offered three options: 1) continue to observe, 2) repeat CT scan (but it was not seen on the last CT scan), and 3) go to surgery.   Past Medical History   Diagnosis  Date   .  HYPERLIPIDEMIA  11/28/2007   .  GLAUCOMA  12/12/2006   .  HEARING LOSS, HIGH FREQUENCY  12/12/2006   .  HYPERTENSION  12/12/2006   .  BRADYCARDIA  06/08/2008   .  VENOUS INSUFFICIENCY  01/18/2010   .  ALLERGIC RHINITIS  12/12/2006   .  ASTHMA  11/28/2007   .  HERNIA, VENTRAL  01/04/2009   .  RENAL CALCULUS  03/24/2010   .  SYNCOPE  06/08/2008   .  Abdominal pain, epigastric  07/14/2009   .  Leg swelling    .  Visual disturbance    .  Abdominal wall hernia  Past Surgical History   Procedure  Date   .  Cataract extraction  2009 - approximate     bilat & glaucoma surgery (7 ophth surgeries)    Current Outpatient Prescriptions   Medication  Sig  Dispense  Refill   .  aspirin 81 MG EC tablet  Take 81 mg by mouth daily.     .  B Complex Vitamins (B COMPLEX 1 PO)  Take by mouth daily.     .  Cholecalciferol (VITAMIN D PO)  Take by mouth daily.     Marland Kitchen  LOTENSIN 20 MG tablet  TAKE 1 TABLET TWICE DAILY.  180 each  3   .  Multiple Vitamin (MULTIVITAMIN) tablet  Take 1 tablet by mouth daily.     .  naproxen sodium (ANAPROX) 220 MG tablet  Take 220 mg by mouth 2 (two) times daily with a meal.     .  NORVASC 5 MG tablet  TAKE 1 TABLET TWICE DAILY.  90 each  3   .  Omega-3 Fatty Acids (FISH OIL PO)  Take by mouth daily.     Marland Kitchen  PRAVACHOL 40 MG tablet  TAKE ONE TABLET AT BEDTIME.  90 each   3   .  tadalafil (CIALIS) 20 MG tablet  Take 20 mg by mouth daily as needed.     Marland Kitchen  TIMOPTIC OCUDOSE 0.5 % SOLN  1 drop 2 (two) times daily.     .  TRAVATAN Z 0.004 % SOLN ophthalmic solution  Place 1 drop into the right eye at bedtime.      Allergies   Allergen  Reactions   .  Erythromycin  Rash   .  Penicillins  Rash   .  Sulfonamide Derivatives  Rash    REVIEW OF SYSTEMS:  Skin: No history of rash. No history of abnormal moles.  Infection: No history of hepatitis or HIV. No history of MRSA.  Neurologic: No history of stroke. No history of seizure. No history of headaches.  Cardiac: Hypertension for years. No cardiac eval.  Pulmonary: Does not smoke cigarettes. No asthma or bronchitis. No OSA/CPAP.  Endocrine: No diabetes. No thyroid disease. Hypercholesterolemia.  Gastrointestinal: No history of stomach disease. No history of liver disease. No history of gall bladder disease. No history of pancreas disease. No history of colon disease. Colonoscopy last week by Dr. Russella Dar. Dr. Russella Dar found a polyp and suggested 1 year follow up.  Urologic: History of kidney stones -2010.  Musculoskeletal: No history of joint or back disease.  Hematologic: No bleeding disorder. No history of anemia. Not anticoagulated.  Psycho-social: The patient is oriented. The patient has no obvious psychologic or social impairment to understanding our conversation and plan.   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  Married. Wife is with him.   PHYSICAL EXAM:  BP 151/79  Pulse 52  Temp 97.3 F (36.3 C)  Resp 18  SpO2 97% Wt 195 lb 8 oz (88.678 kg)  BMI 28.05 kg/m2   General: WN WM who is alert and generally healthy appearing. Some trouble with vision.  HEENT: Normal. Pupils equal.  Neck: Supple. No mass. No thyroid mass.  Lymph Nodes: No supraclavicular or cervical nodes.  Lungs: Clear to auscultation and symmetric breath sounds.  Heart: RRR. No murmur or  rub.  Abdomen: Soft. No mass. No tenderness. Normal bowel sounds. No abdominal scars. Small to medium right inguinal hernia and maybe a small left  inguinal hernia. He cannot reproduce the abdominal wall hernia, but he is pointing where I would expect a right Spigelian hernia.  Rectal: Not done.  Extremities: Good strength and ROM in upper and lower extremities. Venous stasis changes of lower legs.  Neurologic: Grossly intact to motor and sensory function.  Psychiatric: Has normal mood and affect. Behavior is normal.   DATA REVIEWED:  Notes in chart. CT scan report 02/05/2009 given to patient.  I reviewed the 2010 CT scan and I cannot see evidence of a Spigelian hernia.   Ovidio Kin, MD, [facility]  [address] [place]  [place], [place] [address]  Phone: [phone] FAX: [phone]

## 2012-05-03 MED ORDER — HYDROCODONE-ACETAMINOPHEN 5-325 MG PO TABS: 1.0000 | ORAL_TABLET | ORAL | Status: DC | PRN

## 2012-05-04 NOTE — Discharge Summary (Signed)
Physician Discharge Summary  Patient ID: Charles Li MRN: [id number] DOB/AGE: [birth year] 71 y.o.  Admit date: 05/02/2012 Discharge date: 05/04/2012  Admission Diagnoses:  Discharge Diagnoses:  Active Problems:  * No active hospital problems. *    Discharged Condition: good  Hospital Course: The patient was admitted for overnight observation after a hernia repair.  There were no issues, and he was discharge the following morning.  Consults: None  Significant Diagnostic Studies: labs: CBC, Chemistry  Treatments: surgery: hernia repair  Discharge Exam: Blood pressure 152/83, pulse 56, temperature 97.5 F (36.4 C), temperature source Oral, resp. rate 18, height 5\' 10"  (1.778 m), weight 197 lb (89.359 kg), SpO2 96.00%. General appearance: alert and no distress GI: soft, appropriately tender, inc: c/d/i  Disposition: 01-Home or Self Care  Discharge Orders    Future Appointments: Provider: Department: Dept Phone: Center:   07/14/2012 8:30 AM Kristian Covey, MD [facility] [phone] [facility]       Medication List     As of 05/04/2012  7:03 AM    STOP taking these medications         multivitamin tablet      TAKE these medications         amLODipine 5 MG tablet   Commonly known as: NORVASC   Take 5 mg by mouth 2 (two) times daily.      aspirin 81 MG tablet   Take 81 mg by mouth at bedtime.      B COMPLEX 1 PO   Take by mouth daily.      benazepril 20 MG tablet   Commonly known as: LOTENSIN   Take 20 mg by mouth 2 (two) times daily.      HYDROcodone-acetaminophen 5-325 MG per tablet   Commonly known as: NORCO/VICODIN   Take 1-2 tablets by mouth every 4 (four) hours as needed.      naproxen sodium 220 MG tablet   Commonly known as: ANAPROX   Take 220 mg by mouth 2 (two) times daily with a meal.      pravastatin 40 MG tablet   Commonly known as: PRAVACHOL   Take 40 mg by mouth at bedtime.      tadalafil 20 MG tablet   Commonly known as:  CIALIS   Take 20 mg by mouth daily as needed.      TIMOPTIC OCUDOSE 0.5 % Soln   Generic drug: Timolol Maleate PF   1 drop 2 (two) times daily.      TRAVATAN Z 0.004 % Soln ophthalmic solution   Generic drug: Travoprost (BAK Free)   Place 1 drop into the right eye at bedtime.         Signed: Nahara Dona C. 05/04/2012, 7:03 AM

## 2012-05-05 ENCOUNTER — Encounter (HOSPITAL_COMMUNITY): Payer: Self-pay | Admitting: Surgery

## 2012-05-06 ENCOUNTER — Telehealth (INDEPENDENT_AMBULATORY_CARE_PROVIDER_SITE_OTHER): Payer: Self-pay

## 2012-05-06 NOTE — Telephone Encounter (Signed)
msg for pt to call

## 2012-05-13 ENCOUNTER — Encounter (INDEPENDENT_AMBULATORY_CARE_PROVIDER_SITE_OTHER): Payer: Self-pay | Admitting: Surgery

## 2012-05-13 ENCOUNTER — Ambulatory Visit (INDEPENDENT_AMBULATORY_CARE_PROVIDER_SITE_OTHER): Payer: PRIVATE HEALTH INSURANCE | Admitting: Surgery

## 2012-05-13 VITALS — BP 140/82 | HR 62 | Temp 98.6°F | Resp 18 | Ht 70.0 in | Wt 193.4 lb

## 2012-05-13 DIAGNOSIS — K439 Ventral hernia without obstruction or gangrene: Secondary | ICD-10-CM

## 2012-05-13 NOTE — Progress Notes (Signed)
Post op Note:  Has done well with 3 hernias repaired. His wife is with him. He had some distention and moderate discomfort.  BP 140/82  Pulse 62  Temp 98.6 F (37 C) (Oral)  Resp 18  Ht 5\' 10"  (1.778 m)  Wt 193 lb 6.4 oz (87.726 kg)  BMI 27.75 kg/m2  Abdomen:  Incisions look good. He feels some pulling in the RLQ area.  He is wearing the abdominal binder.    A&P:  S/P repair of right Spigelian hernia and bilateral inguinal hernias.  Return appt PRN.  Ovidio Kin, MD, [facility] Pager: [phone] Office phone:  [phone]

## 2012-05-28 ENCOUNTER — Encounter (INDEPENDENT_AMBULATORY_CARE_PROVIDER_SITE_OTHER): Payer: PRIVATE HEALTH INSURANCE | Admitting: Surgery

## 2012-06-02 ENCOUNTER — Encounter (HOSPITAL_COMMUNITY): Payer: Self-pay

## 2012-07-14 ENCOUNTER — Encounter: Payer: Self-pay | Admitting: Family Medicine

## 2012-07-14 ENCOUNTER — Ambulatory Visit (INDEPENDENT_AMBULATORY_CARE_PROVIDER_SITE_OTHER): Payer: Medicare Other | Admitting: Family Medicine

## 2012-07-14 VITALS — BP 140/68 | Temp 98.0°F | Wt 194.0 lb

## 2012-07-14 DIAGNOSIS — I10 Essential (primary) hypertension: Secondary | ICD-10-CM

## 2012-07-14 DIAGNOSIS — E785 Hyperlipidemia, unspecified: Secondary | ICD-10-CM

## 2012-07-14 DIAGNOSIS — N529 Male erectile dysfunction, unspecified: Secondary | ICD-10-CM

## 2012-07-14 MED ORDER — TADALAFIL 5 MG PO TABS: 5.0000 mg | ORAL_TABLET | Freq: Every day | ORAL | Status: DC | PRN

## 2012-07-14 NOTE — Progress Notes (Signed)
  Subjective:    Patient ID: Charles Li, male    DOB: May 19, 1941, 72 y.o.   MRN: [id number]  HPI Medical followup. Hypertension treated with benazepril and amlodipine. Blood pressure is very well controlled by home readings. 120s systolic. Patient has no dizziness. No orthostasis. No chest pains.  History of erectile dysfunction. Requesting trial of Cialis 5 mg daily. He does have occasional slow stream and BPH symptoms. He is taking Cialis 20 mg in the past which was worked well. No contraindications  Hyperlipidemia treated with pravastatin. No myalgias. No cardiac history. Lipids were checked last summer in stable at goal.  Past Medical History  Diagnosis Date  . HYPERLIPIDEMIA 11/28/2007  . GLAUCOMA 12/12/2006  . 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 HYPERTENSION 12/12/2006  . 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 VENOUS INSUFFICIENCY 01/18/2010  . ALLERGIC RHINITIS 12/12/2006  . ASTHMA 11/28/2007  . HERNIA, VENTRAL 01/04/2009  . RENAL CALCULUS 03/24/2010  . SYNCOPE 06/08/2008  . Abdominal pain, epigastric 07/14/2009  . Leg swelling 04-24-12    occ., none at present  . Visual disturbance 04-24-12    legally blind  . Abdominal wall hernia    Past Surgical History  Procedure Date  . Cataract extraction 2009 - approximate    bilat & glaucoma surgery (7 ophth surgeries)  . Vasectomy 04-24-12  . Ventral hernia repair 05/02/2012    Procedure: LAPAROSCOPIC VENTRAL HERNIA;  Surgeon: Kandis Cocking, MD;  Location: [facility];  Service: General;  Laterality: N/A;  . Inguinal hernia repair 05/02/2012    Procedure: HERNIA REPAIR INGUINAL ADULT BILATERAL;  Surgeon: Kandis Cocking, MD;  Location: [facility];  Service: General;  Laterality: N/A;    reports that he quit smoking about 42 years ago. His smoking use included Cigarettes. He has a 21 pack-year smoking history. He has never used smokeless tobacco. He reports that he drinks about 8.4 ounces of alcohol per week. He reports that he does not use illicit  drugs. family history includes Birth defects in his paternal aunt; Cancer in his mother; Heart disease in his brother; and Pneumonia in his father.  There is no history of Colon cancer and Stomach cancer. Allergies  Allergen Reactions  . Erythromycin Rash  . Penicillins Rash  . Sulfonamide Derivatives Rash      Review of Systems  Constitutional: Negative for fatigue.  HENT: Positive for congestion.   Eyes: Negative for visual disturbance.  Respiratory: Negative for cough, chest tightness and shortness of breath.   Cardiovascular: Negative for chest pain and palpitations.  Genitourinary: Negative for difficulty urinating.  Neurological: Negative for dizziness, syncope, weakness, light-headedness and headaches.       Objective:   Physical Exam  Constitutional: He appears well-developed and well-nourished. No distress.  Neck: Neck supple. No thyromegaly present.  Cardiovascular: Normal rate and regular rhythm.   Pulmonary/Chest: Effort normal and breath sounds normal. No respiratory distress. He has no wheezes. He has no rales.  Musculoskeletal: He exhibits no edema.  Lymphadenopathy:    He has no cervical adenopathy.          Assessment & Plan:  #1 hypertension. Very well controlled by home readings. Continue current regimen #2 erectile dysfunction. He has also history of mild BPH. Trial of Cialis 5 mg daily with prescription given  #3 hyperlipidemia. Stable. Recheck lipids at followup in 6 months

## 2012-07-16 ENCOUNTER — Telehealth: Payer: Self-pay | Admitting: Family Medicine

## 2012-07-16 NOTE — Telephone Encounter (Signed)
Patient Information:  Caller Name: Virl Diamond  Phone: [phone]  Patient: Charles Li, Charles Li  Gender: Male  DOB: 28-Jun-1941  Age: 72 Years  PCP: Evelena Peat (Family Practice)  Office Follow Up:  Does the office need to follow up with this patient?: No  Instructions For The Office: N/A   Symptoms  Reason For Call & Symptoms: Caller has cold symptoms and wants to know if he can get the flu vaccine.  Per profile advised caller if pt has cough or cold w/no fever they typically do not give the flu vaccine.  Pt seen on 07/25/11 for a follow.  Pt only wants the question answered about the flu vaccine.  Reviewed Health History In EMR: N/A  Reviewed Medications In EMR: N/A  Reviewed Allergies In EMR: N/A  Reviewed Surgeries / Procedures: N/A  Date of Onset of Symptoms: 07/13/2012  Guideline(s) Used:  No Protocol Available - Information Only  Disposition Per Guideline:   Home Care  Reason For Disposition Reached:   Information only question and nurse able to answer  Advice Given:  N/A

## 2012-07-16 NOTE — Telephone Encounter (Signed)
Pt informed if he is not running a fever, it would be OK to give flu.  OK also to wait until he is feeling better, call and schedule appt with nurse for vaccine.  Pt voiced understanding

## 2012-07-20 ENCOUNTER — Other Ambulatory Visit: Payer: Self-pay | Admitting: Family Medicine

## 2012-07-21 ENCOUNTER — Telehealth: Payer: Self-pay | Admitting: Family Medicine

## 2012-07-21 ENCOUNTER — Telehealth: Payer: Self-pay | Admitting: *Deleted

## 2012-07-21 DIAGNOSIS — N400 Enlarged prostate without lower urinary tract symptoms: Secondary | ICD-10-CM | POA: Insufficient documentation

## 2012-07-21 MED ORDER — TADALAFIL 5 MG PO TABS: 5.0000 mg | ORAL_TABLET | Freq: Every day | ORAL | Status: DC | PRN

## 2012-07-21 NOTE — Telephone Encounter (Signed)
He does have symptoms compatible with BPH.  I have added this to his list. He should be able to take Cialis 5 mg daily for BPH.

## 2012-07-21 NOTE — Telephone Encounter (Signed)
Cialis not covered per insurance:  Pt says should be covered due because it is for his prostate.  Dr. Isidore Moos needs to call [phone].  Please advise, not on problem list.

## 2012-07-21 NOTE — Telephone Encounter (Signed)
Patient called stating that his cialis was called in for 20mg  and should have been 5mg . Please correct and send this to [facility].

## 2012-07-22 MED ORDER — TADALAFIL 5 MG PO TABS: 5.0000 mg | ORAL_TABLET | Freq: Every day | ORAL | Status: DC | PRN

## 2012-07-30 ENCOUNTER — Other Ambulatory Visit: Payer: Self-pay | Admitting: Family Medicine

## 2012-07-30 MED ORDER — TADALAFIL 5 MG PO TABS: 5.0000 mg | ORAL_TABLET | Freq: Every day | ORAL | Status: DC | PRN

## 2012-07-30 NOTE — Telephone Encounter (Signed)
Pt needs cialis 5 mg for daily use call into [facility] [phone].

## 2012-07-31 ENCOUNTER — Telehealth: Payer: Self-pay | Admitting: Family Medicine

## 2012-07-31 NOTE — Telephone Encounter (Signed)
Charles Li, please see note below and contact pt to discuss. Thank you!!

## 2012-07-31 NOTE — Telephone Encounter (Signed)
Please confirm if pt is willing to try one of these. Make sure he is aware these alternatives with do nothing for ED symptoms.

## 2012-07-31 NOTE — Telephone Encounter (Signed)
Patient's prior auth for Cialis 5mg  was denied. Any use of this med for ED is excluded from Medicare coverage. It is covered under BPH - which is what I submitted it for.  However, in order for Medicare to cover it, patient must try and fail TWO of the following: tamsulosin, finasteride, and/or alfuzosin.  Please advise.

## 2012-08-01 NOTE — Telephone Encounter (Signed)
OK to try

## 2012-08-01 NOTE — Telephone Encounter (Signed)
Pt informed.  Pt would like to try the free 30 day trial Cialis he read about on the internet, and is requesting Rx be mailed to his home.

## 2012-09-08 ENCOUNTER — Other Ambulatory Visit: Payer: Self-pay | Admitting: Dermatology

## 2012-10-07 ENCOUNTER — Other Ambulatory Visit: Payer: Self-pay | Admitting: Family Medicine

## 2012-12-31 ENCOUNTER — Other Ambulatory Visit: Payer: Self-pay | Admitting: Family Medicine

## 2012-12-31 NOTE — Telephone Encounter (Signed)
Rx request to pharmacy/SLS  

## 2013-01-13 ENCOUNTER — Ambulatory Visit (INDEPENDENT_AMBULATORY_CARE_PROVIDER_SITE_OTHER): Payer: Medicare Other | Admitting: Family Medicine

## 2013-01-13 ENCOUNTER — Encounter: Payer: Self-pay | Admitting: Family Medicine

## 2013-01-13 VITALS — BP 124/70 | HR 60 | Temp 97.8°F | Ht 70.0 in | Wt 191.0 lb

## 2013-01-13 DIAGNOSIS — E785 Hyperlipidemia, unspecified: Secondary | ICD-10-CM

## 2013-01-13 DIAGNOSIS — I10 Essential (primary) hypertension: Secondary | ICD-10-CM

## 2013-01-13 DIAGNOSIS — N400 Enlarged prostate without lower urinary tract symptoms: Secondary | ICD-10-CM

## 2013-01-13 LAB — HEPATIC FUNCTION PANEL
ALT: 12 U/L (ref 0–53)
AST: 16 U/L (ref 0–37)
Total Bilirubin: 0.9 mg/dL (ref 0.3–1.2)
Total Protein: 6.6 g/dL (ref 6.0–8.3)

## 2013-01-13 LAB — LIPID PANEL
Cholesterol: 102 mg/dL (ref 0–200)
HDL: 58.2 mg/dL (ref >39.00)
Triglycerides: 46 mg/dL (ref 0.0–149.0)
VLDL: 9.2 mg/dL (ref 0.0–40.0)

## 2013-01-13 NOTE — Progress Notes (Signed)
Subjective:    Patient ID: Charles Li, male    DOB: Aug 23, 1940, 72 y.o.   MRN: [id number]  HPI Medical followup Patient has history of hypertension, hyperlipidemia, glaucoma, BPH BPH symptoms of been relatively stable. He was previously on Cialis but did not see much benefit in terms of erectile dysfunction or BPH symptoms. Also, his insurance did not cover this medication so he discontinued.  He remains on pravastatin for hyperlipidemia. No myalgias. No recent chest pains. Hypertension treated with benazepril and amlodipine. Blood pressure stable. No dizziness.  Patient quit smoking several years ago. No consistent exercise.  Past Medical History  Diagnosis Date  . HYPERLIPIDEMIA 11/28/2007  . GLAUCOMA 12/12/2006  . 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 HYPERTENSION 12/12/2006  . 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 VENOUS INSUFFICIENCY 01/18/2010  . ALLERGIC RHINITIS 12/12/2006  . ASTHMA 11/28/2007  . HERNIA, VENTRAL 01/04/2009  . RENAL CALCULUS 03/24/2010  . SYNCOPE 06/08/2008  . Abdominal pain, epigastric 07/14/2009  . Leg swelling 04-24-12    occ., none at present  . Visual disturbance 04-24-12    legally blind  . Abdominal wall hernia    Past Surgical History  Procedure Laterality Date  . Cataract extraction  2009 - approximate    bilat & glaucoma surgery (7 ophth surgeries)  . Vasectomy  04-24-12  . Ventral hernia repair  05/02/2012    Procedure: LAPAROSCOPIC VENTRAL HERNIA;  Surgeon: Kandis Cocking, MD;  Location: [facility];  Service: General;  Laterality: N/A;  . Inguinal hernia repair  05/02/2012    Procedure: HERNIA REPAIR INGUINAL ADULT BILATERAL;  Surgeon: Kandis Cocking, MD;  Location: [facility];  Service: General;  Laterality: N/A;    reports that he quit smoking about 43 years ago. His smoking use included Cigarettes. He has a 21 pack-year smoking history. He has never used smokeless tobacco. He reports that he drinks about 8.4 ounces of alcohol per week. He reports that he does  not use illicit drugs. family history includes Birth defects in his paternal aunt; Cancer in his mother; Heart disease in his brother; and Pneumonia in his father.  There is no history of Colon cancer and Stomach cancer. Allergies  Allergen Reactions  . Erythromycin Rash  . Penicillins Rash  . Sulfonamide Derivatives Rash      Review of Systems  Constitutional: Negative for appetite change and fatigue.  HENT: Negative for ear pain, congestion and trouble swallowing.   Eyes: Positive for visual disturbance. Negative for pain.  Respiratory: Negative for cough and shortness of breath.   Cardiovascular: Negative for chest pain and palpitations.  Gastrointestinal: Negative for abdominal pain and abdominal distention.  Genitourinary: Negative for dysuria, hematuria and testicular pain.  Musculoskeletal: Negative for joint swelling and arthralgias.  Skin: Negative for rash.  Neurological: Negative for dizziness, syncope and headaches.  Hematological: Negative for adenopathy.  Psychiatric/Behavioral: Negative for dysphoric mood.       Objective:   Physical Exam  Constitutional: He appears well-developed and well-nourished.  HENT:  Right Ear: External ear normal.  Left Ear: External ear normal.  Mouth/Throat: Oropharynx is clear and moist.  Neck: Neck supple. No thyromegaly present.  Cardiovascular: Normal rate and regular rhythm.   Pulmonary/Chest: Effort normal and breath sounds normal. No respiratory distress. He has no wheezes. He has no rales.  Musculoskeletal: He exhibits no edema.          Assessment & Plan:  #1 hypertension. Well controlled. Continue current medications #2 hyperlipidemia. Recheck lipid and  hepatic panel. Continue pravastatin. Continue baby aspirin. #3 history of BPH. Symptomatically stable.

## 2013-01-15 ENCOUNTER — Encounter: Payer: Self-pay | Admitting: Gastroenterology

## 2013-02-18 ENCOUNTER — Other Ambulatory Visit: Payer: Self-pay | Admitting: Family Medicine

## 2013-04-01 ENCOUNTER — Other Ambulatory Visit: Payer: Self-pay | Admitting: Family Medicine

## 2013-04-14 ENCOUNTER — Other Ambulatory Visit: Payer: Self-pay | Admitting: Family Medicine

## 2013-05-22 ENCOUNTER — Encounter: Payer: Self-pay | Admitting: Gastroenterology

## 2013-05-29 ENCOUNTER — Telehealth: Payer: Self-pay | Admitting: Family Medicine

## 2013-05-29 NOTE — Telephone Encounter (Signed)
Pt would like referal for ongoing GI issues. Pt has occasional severe bouts of diarrehea , lots of heartburn and nausea, but not all at same time. pls advise

## 2013-05-31 NOTE — Telephone Encounter (Signed)
I recommend follow up to discuss/evaluate. 

## 2013-06-01 NOTE — Telephone Encounter (Signed)
Pt stated that he was taking some new medication and he read the side effects of the medication and its all the symptoms that he has been having. Patient stated that he will hold off on the referral.

## 2013-07-23 ENCOUNTER — Ambulatory Visit (AMBULATORY_SURGERY_CENTER): Payer: Self-pay

## 2013-07-23 VITALS — Ht 70.0 in | Wt 190.0 lb

## 2013-07-23 DIAGNOSIS — Z860100 Personal history of colon polyps, unspecified: Secondary | ICD-10-CM

## 2013-07-23 MED ORDER — MOVIPREP 100 G PO SOLR: 1.0000 | Freq: Once | ORAL | Status: DC

## 2013-07-31 ENCOUNTER — Encounter: Payer: Self-pay | Admitting: Gastroenterology

## 2013-08-06 ENCOUNTER — Ambulatory Visit (AMBULATORY_SURGERY_CENTER): Payer: Medicare HMO | Admitting: Gastroenterology

## 2013-08-06 ENCOUNTER — Encounter: Payer: Self-pay | Admitting: Gastroenterology

## 2013-08-06 VITALS — BP 147/74 | HR 51 | Temp 97.5°F | Resp 21 | Ht 70.0 in | Wt 190.0 lb

## 2013-08-06 DIAGNOSIS — Z860100 Personal history of colon polyps, unspecified: Secondary | ICD-10-CM

## 2013-08-06 MED ORDER — SODIUM CHLORIDE 0.9 % IV SOLN: 500.0000 mL | INTRAVENOUS | Status: DC

## 2013-08-06 NOTE — Op Note (Signed)
[place]  [address] [place], [id number]   COLONOSCOPY PROCEDURE REPORT  PATIENT: Charles Li, Charles Li  MR#: [id number] BIRTHDATE: 11-15-1940 , 72  yrs. old GENDER: Male ENDOSCOPIST: Ladene Artist, MD, [facility] PROCEDURE DATE:  08/06/2013 PROCEDURE:   Colonoscopy, surveillance First Screening Colonoscopy - Avg.  risk and is 50 yrs.  old or older - No.  Prior Negative Screening - Now for repeat screening. N/A  History of Adenoma - Now for follow-up colonoscopy & has been > or = to 3 yrs.  No.  It has been less than 3 yrs since last colonoscopy.  Medical reason.  Polyps Removed Today? No.  Recommend repeat exam, <10 yrs? Yes.  High risk (family or personal hx). ASA CLASS:   Class II INDICATIONS:Patient's personal history of adenomatous colon polyps. F/U piecemeal polypectomy. MEDICATIONS: MAC sedation, administered by CRNA and propofol (Diprivan) 200mg  IV DESCRIPTION OF PROCEDURE:   After the risks benefits and alternatives of the procedure were thoroughly explained, informed consent was obtained.  A digital rectal exam revealed no abnormalities of the rectum.   The LB [id number]  endoscope was introduced through the anus and advanced to the cecum, which was identified by both the appendix and ileocecal valve. No adverse events experienced.   The quality of the prep was good, using MoviPrep  The instrument was then slowly withdrawn as the colon was fully examined.  COLON FINDINGS: A normal appearing cecum, ileocecal valve, and appendiceal orifice were identified.  The ascending, hepatic flexure, transverse, splenic flexure, descending, sigmoid colon and rectum appeared unremarkable.  No polyps or cancers were seen. Retroflexed views revealed small internal hemorrhoids. The time to cecum=4 minutes 49 seconds.  Withdrawal time=9 minutes 03 seconds. The scope was withdrawn and the procedure completed.  COMPLICATIONS: There were no  complications.  ENDOSCOPIC IMPRESSION: 1.  Normal colon 2.  Small internal hemorrhoids  RECOMMENDATIONS: 1.  Repeat Colonoscopy in 5 years.  eSigned:  Ladene Artist, MD, [facility] 08/06/2013 11:05 AM

## 2013-08-06 NOTE — Patient Instructions (Signed)
YOU HAD AN ENDOSCOPIC PROCEDURE TODAY AT THE [facility]: Refer to the procedure report that was given to you for any specific questions about what was found during the examination.  If the procedure report does not answer your questions, please call your gastroenterologist to clarify.  If you requested that your care partner not be given the details of your procedure findings, then the procedure report has been included in a sealed envelope for you to review at your convenience later.  YOU SHOULD EXPECT: Some feelings of bloating in the abdomen. Passage of more gas than usual.  Walking can help get rid of the air that was put into your GI tract during the procedure and reduce the bloating. If you had a lower endoscopy (such as a colonoscopy or flexible sigmoidoscopy) you may notice spotting of blood in your stool or on the toilet paper. If you underwent a bowel prep for your procedure, then you may not have a normal bowel movement for a few days.  DIET: Your first meal following the procedure should be a light meal and then it is ok to progress to your normal diet.  A half-sandwich or bowl of soup is an example of a good first meal.  Heavy or fried foods are harder to digest and may make you feel nauseous or bloated.  Likewise meals heavy in dairy and vegetables can cause extra gas to form and this can also increase the bloating.  Drink plenty of fluids but you should avoid alcoholic beverages for 24 hours.  ACTIVITY: Your care partner should take you home directly after the procedure.  You should plan to take it easy, moving slowly for the rest of the day.  You can resume normal activity the day after the procedure however you should NOT DRIVE or use heavy machinery for 24 hours (because of the sedation medicines used during the test).    SYMPTOMS TO REPORT IMMEDIATELY: A gastroenterologist can be reached at any hour.  During normal business hours, 8:30 AM to 5:00 PM Monday through Friday,  call [phone].  After hours and on weekends, please call the GI answering service at [phone] who will take a message and have the physician on call contact you.   Following lower endoscopy (colonoscopy or flexible sigmoidoscopy):  Excessive amounts of blood in the stool  Significant tenderness or worsening of abdominal pains  Swelling of the abdomen that is new, acute  Fever of 100F or higher    FOLLOW UP: If any biopsies were taken you will be contacted by phone or by letter within the next 1-3 weeks.  Call your gastroenterologist if you have not heard about the biopsies in 3 weeks.  Our staff will call the home number listed on your records the next business day following your procedure to check on you and address any questions or concerns that you may have at that time regarding the information given to you following your procedure. This is a courtesy call and so if there is no answer at the home number and we have not heard from you through the emergency physician on call, we will assume that you have returned to your regular daily activities without incident.  SIGNATURES/CONFIDENTIALITY: You and/or your care partner have signed paperwork which will be entered into your electronic medical record.  These signatures attest to the fact that that the information above on your After Visit Summary has been reviewed and is understood.  Full responsibility of the confidentiality   of this discharge information lies with you and/or your care-partner.   INFORMATION ON HEMORRHOIDS GIVEN TO YOU TODAY 

## 2013-08-06 NOTE — Progress Notes (Signed)
A/ox3 pleased with MAC, report to Penny RN 

## 2013-08-07 ENCOUNTER — Telehealth: Payer: Self-pay | Admitting: *Deleted

## 2013-08-07 NOTE — Telephone Encounter (Signed)
  Follow up Call-  Call back number 08/06/2013 02/08/2012  Post procedure Call Back phone  # [phone]  Permission to leave phone message Yes Yes     Patient questions:  Do you have a fever, pain , or abdominal swelling? no Pain Score  0 *  Have you tolerated food without any problems? yes  Have you been able to return to your normal activities? yes  Do you have any questions about your discharge instructions: Diet   no Medications  no Follow up visit  no  Do you have questions or concerns about your Care? no  Actions: * If pain score is 4 or above: No action needed, pain <4.

## 2014-01-12 ENCOUNTER — Ambulatory Visit (INDEPENDENT_AMBULATORY_CARE_PROVIDER_SITE_OTHER): Payer: Medicare HMO | Admitting: Family Medicine

## 2014-01-12 ENCOUNTER — Encounter: Payer: Self-pay | Admitting: Family Medicine

## 2014-01-12 ENCOUNTER — Telehealth: Payer: Self-pay | Admitting: Family Medicine

## 2014-01-12 VITALS — BP 140/80 | HR 60 | Temp 97.8°F | Wt 192.0 lb

## 2014-01-12 DIAGNOSIS — I10 Essential (primary) hypertension: Secondary | ICD-10-CM

## 2014-01-12 DIAGNOSIS — R600 Localized edema: Secondary | ICD-10-CM

## 2014-01-12 DIAGNOSIS — R609 Edema, unspecified: Secondary | ICD-10-CM

## 2014-01-12 LAB — TSH: TSH: 0.28 u[IU]/mL — AB (ref 0.35–4.50)

## 2014-01-12 LAB — BASIC METABOLIC PANEL
BUN: 19 mg/dL (ref 6–23)
CO2: 32 mEq/L (ref 19–32)
CREATININE: 0.8 mg/dL (ref 0.4–1.5)
Calcium: 9.9 mg/dL (ref 8.4–10.5)
Chloride: 103 mEq/L (ref 96–112)
GFR: 103.71 mL/min (ref >60.00)
GLUCOSE: 121 mg/dL — AB (ref 70–99)
POTASSIUM: 5.4 meq/L — AB (ref 3.5–5.1)
Sodium: 141 mEq/L (ref 135–145)

## 2014-01-12 MED ORDER — BENAZEPRIL-HYDROCHLOROTHIAZIDE 20-12.5 MG PO TABS: 1.0000 | ORAL_TABLET | Freq: Every day | ORAL | Status: DC

## 2014-01-12 MED ORDER — AMLODIPINE BESYLATE 5 MG PO TABS: 5.0000 mg | ORAL_TABLET | Freq: Every day | ORAL | Status: DC

## 2014-01-12 NOTE — Patient Instructions (Signed)

## 2014-01-12 NOTE — Progress Notes (Signed)
Pre visit review using our clinic review tool, if applicable. No additional management support is needed unless otherwise documented below in the visit note. 

## 2014-01-12 NOTE — Telephone Encounter (Signed)
Relevant patient education assigned to patient using Emmi. ° °

## 2014-01-12 NOTE — Progress Notes (Signed)
   Subjective:    Patient ID: Charles Li, male    DOB: 1941/04/10, 73 y.o.   MRN: [id number]  HPI Patient seen with progressive bilateral leg edema. He's had some issues with this in the past but worsening over the past several weeks. No dietary changes. He has hypertension treated with benazepril 20 mg twice daily and amlodipine 5 mg twice daily. Compliant with therapy. No headaches. No dyspnea. Edema tends to be worse late in the day and worse with dependency. It improved slightly with elevation. No orthopnea. No exertional dyspnea. No chest pains. No major fatigue issues. Appetite and weight are stable. Echocardiogram 4132 normal systolic function  Reviewed with no changes: Past Medical History  Diagnosis Date  . HYPERLIPIDEMIA 11/28/2007  . GLAUCOMA 12/12/2006  . 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 HYPERTENSION 12/12/2006  . 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 VENOUS INSUFFICIENCY 01/18/2010  . ALLERGIC RHINITIS 12/12/2006  . ASTHMA 11/28/2007  . HERNIA, VENTRAL 01/04/2009  . RENAL CALCULUS 03/24/2010  . SYNCOPE 06/08/2008  . Abdominal pain, epigastric 07/14/2009  . Leg swelling 04-24-12    occ., none at present  . Visual disturbance 04-24-12    legally blind  . Abdominal wall hernia    Past Surgical History  Procedure Laterality Date  . Cataract extraction  2009 - approximate    bilat & glaucoma surgery (7 ophth surgeries)  . Vasectomy  04-24-12  . Ventral hernia repair  05/02/2012    Procedure: LAPAROSCOPIC VENTRAL HERNIA;  Surgeon: Shann Medal, MD;  Location: [facility];  Service: General;  Laterality: N/A;  . Inguinal hernia repair  05/02/2012    Procedure: HERNIA REPAIR INGUINAL ADULT BILATERAL;  Surgeon: Shann Medal, MD;  Location: [facility];  Service: General;  Laterality: N/A;    reports that he quit smoking about 44 years ago. His smoking use included Cigarettes. He has a 21 pack-year smoking history. He has never used smokeless tobacco. He reports that he drinks about 8.4 ounces  of alcohol per week. He reports that he does not use illicit drugs. family history includes Birth defects in his paternal aunt; Cancer in his mother; Heart disease in his brother; Pneumonia in his father. There is no history of Colon cancer or Stomach cancer. Allergies  Allergen Reactions  . Erythromycin Rash  . Penicillins Rash  . Sulfonamide Derivatives Rash      Review of Systems  Constitutional: Negative for appetite change and unexpected weight change.  Respiratory: Negative for cough and shortness of breath.   Cardiovascular: Positive for leg swelling. Negative for chest pain and palpitations.  Neurological: Negative for dizziness, seizures, syncope and weakness.       Objective:   Physical Exam  Constitutional: He appears well-developed and well-nourished.  Neck: Neck supple. No JVD present. No thyromegaly present.  Cardiovascular: Normal rate and regular rhythm.  Exam reveals no gallop.   No murmur heard. Pulmonary/Chest: Effort normal and breath sounds normal. No respiratory distress. He has no wheezes. He has no rales.  Musculoskeletal: He exhibits edema.  Trace to 1+ pitting edema feet ankles and lower legs bilaterally. Feet are warm to touch with 1+ dorsalis pedis pulses          Assessment & Plan:  Bilateral leg edema. Suspect venous stasis and amlodipine contributing. Check basic metabolic panel. Check TSH. Reduce amlodipine 5 mg once daily. Change benazepril to benazepril HCTZ 20/12.5 one daily. Elevate legs frequently. Reduce sodium intake. Reassess 6 weeks

## 2014-01-13 ENCOUNTER — Other Ambulatory Visit: Payer: Self-pay | Admitting: Family Medicine

## 2014-01-13 DIAGNOSIS — R7989 Other specified abnormal findings of blood chemistry: Secondary | ICD-10-CM

## 2014-01-19 ENCOUNTER — Other Ambulatory Visit (INDEPENDENT_AMBULATORY_CARE_PROVIDER_SITE_OTHER): Payer: Medicare HMO

## 2014-01-19 ENCOUNTER — Other Ambulatory Visit: Payer: Medicare HMO

## 2014-01-19 DIAGNOSIS — R7989 Other specified abnormal findings of blood chemistry: Secondary | ICD-10-CM

## 2014-01-19 DIAGNOSIS — R946 Abnormal results of thyroid function studies: Secondary | ICD-10-CM

## 2014-01-20 ENCOUNTER — Other Ambulatory Visit: Payer: Self-pay | Admitting: Family Medicine

## 2014-01-20 DIAGNOSIS — R7989 Other specified abnormal findings of blood chemistry: Secondary | ICD-10-CM

## 2014-01-20 LAB — TSH: TSH: 0.09 u[IU]/mL — ABNORMAL LOW (ref 0.35–4.50)

## 2014-01-20 LAB — T3, FREE: T3, Free: 3 pg/mL (ref 2.3–4.2)

## 2014-01-20 LAB — T4, FREE: Free T4: 0.91 ng/dL (ref 0.60–1.60)

## 2014-02-23 ENCOUNTER — Ambulatory Visit (INDEPENDENT_AMBULATORY_CARE_PROVIDER_SITE_OTHER): Payer: Medicare HMO | Admitting: Family Medicine

## 2014-02-23 ENCOUNTER — Encounter: Payer: Self-pay | Admitting: Family Medicine

## 2014-02-23 VITALS — BP 148/82 | HR 60 | Temp 98.1°F | Wt 188.0 lb

## 2014-02-23 DIAGNOSIS — R946 Abnormal results of thyroid function studies: Secondary | ICD-10-CM

## 2014-02-23 DIAGNOSIS — I10 Essential (primary) hypertension: Secondary | ICD-10-CM

## 2014-02-23 DIAGNOSIS — R7989 Other specified abnormal findings of blood chemistry: Secondary | ICD-10-CM

## 2014-02-23 LAB — BASIC METABOLIC PANEL
BUN: 18 mg/dL (ref 6–23)
CALCIUM: 9.5 mg/dL (ref 8.4–10.5)
CO2: 32 mEq/L (ref 19–32)
Chloride: 102 mEq/L (ref 96–112)
Creatinine, Ser: 0.8 mg/dL (ref 0.4–1.5)
GFR: 95.18 mL/min (ref >60.00)
GLUCOSE: 104 mg/dL — AB (ref 70–99)
POTASSIUM: 4.4 meq/L (ref 3.5–5.1)
Sodium: 142 mEq/L (ref 135–145)

## 2014-02-23 LAB — T4, FREE: Free T4: 0.8 ng/dL (ref 0.60–1.60)

## 2014-02-23 LAB — TSH: TSH: 1.01 u[IU]/mL (ref 0.35–4.50)

## 2014-02-23 NOTE — Progress Notes (Signed)
Subjective:    Patient ID: Charles Li, male    DOB: [birth year], 73 y.o.   MRN: [id number]  HPI  Followup regarding hypertension recent peripheral edema. Refer to notes. He had labs which revealed low TSH. He was not having other symptoms of hyperthyroidism such as weight loss, tremors, tachycardia, or diarrhea. We did order repeat TSH and free T4. We reduced his amlodipine from 10 mg to 5 mg and switch his benazepril to benazepril HCTZ. Edema has resolved. Blood pressure mostly around 211H systolic and 80 diastolic. No headaches. No dizziness. Overall feels well.  Past Medical History  Diagnosis Date  . HYPERLIPIDEMIA 11/28/2007  . GLAUCOMA 12/12/2006  . 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 HYPERTENSION 12/12/2006  . 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 VENOUS INSUFFICIENCY 01/18/2010  . ALLERGIC RHINITIS 12/12/2006  . ASTHMA 11/28/2007  . HERNIA, VENTRAL 01/04/2009  . RENAL CALCULUS 03/24/2010  . SYNCOPE 06/08/2008  . Abdominal pain, epigastric 07/14/2009  . Leg swelling 04-24-12    occ., none at present  . Visual disturbance 04-24-12    legally blind  . Abdominal wall hernia    Past Surgical History  Procedure Laterality Date  . Cataract extraction  2009 - approximate    bilat & glaucoma surgery (7 ophth surgeries)  . Vasectomy  04-24-12  . Ventral hernia repair  05/02/2012    Procedure: LAPAROSCOPIC VENTRAL HERNIA;  Surgeon: Shann Medal, MD;  Location: [facility];  Service: General;  Laterality: N/A;  . Inguinal hernia repair  05/02/2012    Procedure: HERNIA REPAIR INGUINAL ADULT BILATERAL;  Surgeon: Shann Medal, MD;  Location: [facility];  Service: General;  Laterality: N/A;    reports that he quit smoking about 44 years ago. His smoking use included Cigarettes. He has a 21 pack-year smoking history. He has never used smokeless tobacco. He reports that he drinks about 8.4 ounces of alcohol per week. He reports that he does not use illicit drugs. family history includes Birth defects in his  paternal aunt; Cancer in his mother; Heart disease in his brother; Pneumonia in his father. There is no history of Colon cancer or Stomach cancer. Allergies  Allergen Reactions  . Erythromycin Rash  . Penicillins Rash  . Sulfonamide Derivatives Rash      Review of Systems  Constitutional: Negative for fatigue.  Eyes: Negative for visual disturbance.  Respiratory: Negative for cough, chest tightness and shortness of breath.   Cardiovascular: Negative for chest pain, palpitations and leg swelling.  Neurological: Negative for dizziness, syncope, weakness, light-headedness and headaches.       Objective:   Physical Exam  Constitutional: He is oriented to person, place, and time. He appears well-developed and well-nourished.  HENT:  Right Ear: External ear normal.  Left Ear: External ear normal.  Mouth/Throat: Oropharynx is clear and moist.  Eyes: Pupils are equal, round, and reactive to light.  Neck: Neck supple. No thyromegaly present.  Cardiovascular: Normal rate and regular rhythm.   Pulmonary/Chest: Effort normal and breath sounds normal. No respiratory distress. He has no wheezes. He has no rales.  Musculoskeletal: He exhibits no edema.  Neurological: He is alert and oriented to person, place, and time.          Assessment & Plan:  #1 hypertension. Marginal control with recent medication changes as above. Continue home monitoring and reassess 4 months. Be in touch if consistently over 417 systolic #2 peripheral edema. Improved following medication changes with reduction in amlodipine and addition of  HCTZ. Continue to monitor

## 2014-02-23 NOTE — Progress Notes (Signed)
Pre visit review using our clinic review tool, if applicable. No additional management support is needed unless otherwise documented below in the visit note. 

## 2014-02-24 ENCOUNTER — Telehealth: Payer: Self-pay | Admitting: Family Medicine

## 2014-02-24 NOTE — Telephone Encounter (Signed)
Relevant patient education assigned to patient using Emmi. ° °

## 2014-04-01 ENCOUNTER — Other Ambulatory Visit: Payer: Self-pay | Admitting: Family Medicine

## 2014-04-14 ENCOUNTER — Telehealth: Payer: Self-pay | Admitting: Family Medicine

## 2014-04-14 NOTE — Telephone Encounter (Signed)
Pt states dr Elease Hashimoto changed his bp med, but pt does not feel its working as well as it should or was on the previous med.  Pt doesn't feel bad, but bp is running 160/85.  Pt wants to know if doc may want to change med again, or have pt to return before next scheduled visit 12/18. pls advise Pt has only 4 tabs of the benazepril-hydrochlorthiazide (LOTENSIN HCT) 20-12.5 MG per tablet

## 2014-04-14 NOTE — Telephone Encounter (Signed)
done

## 2014-04-14 NOTE — Telephone Encounter (Signed)
Can you please call patient to have him come in this week.

## 2014-04-14 NOTE — Telephone Encounter (Signed)
Let's get him back in for recheck and discuss options then

## 2014-04-15 ENCOUNTER — Ambulatory Visit (INDEPENDENT_AMBULATORY_CARE_PROVIDER_SITE_OTHER): Payer: Medicare HMO | Admitting: Family Medicine

## 2014-04-15 ENCOUNTER — Encounter: Payer: Self-pay | Admitting: Family Medicine

## 2014-04-15 VITALS — BP 160/88 | HR 53 | Wt 188.0 lb

## 2014-04-15 DIAGNOSIS — I10 Essential (primary) hypertension: Secondary | ICD-10-CM

## 2014-04-15 MED ORDER — BENAZEPRIL-HYDROCHLOROTHIAZIDE 20-12.5 MG PO TABS: 1.0000 | ORAL_TABLET | Freq: Every day | ORAL | Status: DC

## 2014-04-15 MED ORDER — AMLODIPINE BESYLATE 5 MG PO TABS: 5.0000 mg | ORAL_TABLET | Freq: Every day | ORAL | Status: DC

## 2014-04-15 NOTE — Progress Notes (Signed)
   Subjective:    Patient ID: Charles Li, male    DOB: 1941-02-21, 73 y.o.   MRN: [id number]  Hypertension Pertinent negatives include no chest pain, headaches, palpitations or shortness of breath.   Patient seen for followup hypertension. He is currently taking only benazepril HCTZ. Previously on amlodipine 10 mg daily but had edema issues. He does have occasional headaches but not consistently. No nonsteroidal use. No alcohol use. No excessive sodium use. Compliant with benazepril- HCTZ.  Past Medical History  Diagnosis Date  . HYPERLIPIDEMIA 11/28/2007  . GLAUCOMA 12/12/2006  . 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 HYPERTENSION 12/12/2006  . 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 VENOUS INSUFFICIENCY 01/18/2010  . ALLERGIC RHINITIS 12/12/2006  . ASTHMA 11/28/2007  . HERNIA, VENTRAL 01/04/2009  . RENAL CALCULUS 03/24/2010  . SYNCOPE 06/08/2008  . Abdominal pain, epigastric 07/14/2009  . Leg swelling 04-24-12    occ., none at present  . Visual disturbance 04-24-12    legally blind  . Abdominal wall hernia    Past Surgical History  Procedure Laterality Date  . Cataract extraction  2009 - approximate    bilat & glaucoma surgery (7 ophth surgeries)  . Vasectomy  04-24-12  . Ventral hernia repair  05/02/2012    Procedure: LAPAROSCOPIC VENTRAL HERNIA;  Surgeon: Shann Medal, MD;  Location: [facility];  Service: General;  Laterality: N/A;  . Inguinal hernia repair  05/02/2012    Procedure: HERNIA REPAIR INGUINAL ADULT BILATERAL;  Surgeon: Shann Medal, MD;  Location: [facility];  Service: General;  Laterality: N/A;    reports that he quit smoking about 44 years ago. His smoking use included Cigarettes. He has a 21 pack-year smoking history. He has never used smokeless tobacco. He reports that he drinks about 8.4 ounces of alcohol per week. He reports that he does not use illicit drugs. family history includes Birth defects in his paternal aunt; Cancer in his mother; Heart disease in his brother; Pneumonia  in his father. There is no history of Colon cancer or Stomach cancer. Allergies  Allergen Reactions  . Erythromycin Rash  . Penicillins Rash  . Sulfonamide Derivatives Rash      Review of Systems  Constitutional: Negative for fatigue.  Eyes: Negative for visual disturbance.  Respiratory: Negative for cough, chest tightness and shortness of breath.   Cardiovascular: Negative for chest pain, palpitations and leg swelling.  Neurological: Negative for dizziness, syncope, weakness, light-headedness and headaches.       Objective:   Physical Exam  Constitutional: He appears well-developed and well-nourished. No distress.  Cardiovascular: Normal rate and regular rhythm.   Pulmonary/Chest: Effort normal and breath sounds normal. No respiratory distress. He has no wheezes. He has no rales.  Musculoskeletal: He exhibits no edema.          Assessment & Plan:  Hypertension. Poorly controlled. Add back amlodipine 5 mg daily. Reassess 3 weeks. If still up then, consider doubling up benazepril HCTZ

## 2014-04-15 NOTE — Progress Notes (Signed)
Pre visit review using our clinic review tool, if applicable. No additional management support is needed unless otherwise documented below in the visit note. 

## 2014-05-07 ENCOUNTER — Encounter: Payer: Self-pay | Admitting: Family Medicine

## 2014-05-07 ENCOUNTER — Ambulatory Visit (INDEPENDENT_AMBULATORY_CARE_PROVIDER_SITE_OTHER): Payer: Medicare HMO | Admitting: Family Medicine

## 2014-05-07 VITALS — BP 138/80 | HR 52 | Temp 98.4°F | Wt 187.0 lb

## 2014-05-07 DIAGNOSIS — Z23 Encounter for immunization: Secondary | ICD-10-CM

## 2014-05-07 DIAGNOSIS — I10 Essential (primary) hypertension: Secondary | ICD-10-CM

## 2014-05-07 NOTE — Progress Notes (Signed)
   Subjective:    Patient ID: Charles Li, male    DOB: [birth year], 73 y.o.   MRN: [id number]  HPI Patient seen for follow-up hypertension. He takes benazepril HCTZ and we recently added back amlodipine 5 mg daily. His home blood pressures have not been monitored of the past week but they have improved though he still has some occasional readings over 628 systolic. No headaches. He has developed a cold and has had some cough and nasal congestion past week.  Past Medical History  Diagnosis Date  . HYPERLIPIDEMIA 11/28/2007  . GLAUCOMA 12/12/2006  . 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 HYPERTENSION 12/12/2006  . 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 VENOUS INSUFFICIENCY 01/18/2010  . ALLERGIC RHINITIS 12/12/2006  . ASTHMA 11/28/2007  . HERNIA, VENTRAL 01/04/2009  . RENAL CALCULUS 03/24/2010  . SYNCOPE 06/08/2008  . Abdominal pain, epigastric 07/14/2009  . Leg swelling 04-24-12    occ., none at present  . Visual disturbance 04-24-12    legally blind  . Abdominal wall hernia    Past Surgical History  Procedure Laterality Date  . Cataract extraction  2009 - approximate    bilat & glaucoma surgery (7 ophth surgeries)  . Vasectomy  04-24-12  . Ventral hernia repair  05/02/2012    Procedure: LAPAROSCOPIC VENTRAL HERNIA;  Surgeon: Shann Medal, MD;  Location: [facility];  Service: General;  Laterality: N/A;  . Inguinal hernia repair  05/02/2012    Procedure: HERNIA REPAIR INGUINAL ADULT BILATERAL;  Surgeon: Shann Medal, MD;  Location: [facility];  Service: General;  Laterality: N/A;    reports that he quit smoking about 44 years ago. His smoking use included Cigarettes. He has a 21 pack-year smoking history. He has never used smokeless tobacco. He reports that he drinks about 8.4 ounces of alcohol per week. He reports that he does not use illicit drugs. family history includes Birth defects in his paternal aunt; Cancer in his mother; Heart disease in his brother; Pneumonia in his father. There is no history  of Colon cancer or Stomach cancer. Allergies  Allergen Reactions  . Erythromycin Rash  . Penicillins Rash  . Sulfonamide Derivatives Rash      Review of Systems  HENT: Positive for congestion.   Eyes: Negative for visual disturbance.  Respiratory: Positive for cough. Negative for chest tightness and shortness of breath.   Cardiovascular: Negative for chest pain, palpitations and leg swelling.  Neurological: Negative for dizziness, syncope, weakness, light-headedness and headaches.       Objective:   Physical Exam  Constitutional: He appears well-developed and well-nourished.  HENT:  Mild posterior pharynx erythema  Cardiovascular: Normal rate and regular rhythm.   Pulmonary/Chest: Effort normal and breath sounds normal. No respiratory distress. He has no wheezes. He has no rales.          Assessment & Plan:  Hypertension. Improved by reading today. Continue close monitoring. He will be in touch if is consistently getting systolic readings over 638 over the next few weeks. If blood pressure still up at that point consider doubling up his benazepril HCTZ to twice daily

## 2014-05-07 NOTE — Progress Notes (Signed)
Pre visit review using our clinic review tool, if applicable. No additional management support is needed unless otherwise documented below in the visit note. 

## 2014-05-07 NOTE — Patient Instructions (Signed)
Monitor blood pressure over the next couple of weeks and let me know if consistently > 150/80.

## 2014-06-18 ENCOUNTER — Telehealth: Payer: Self-pay | Admitting: Family Medicine

## 2014-06-18 MED ORDER — ALBUTEROL SULFATE HFA 108 (90 BASE) MCG/ACT IN AERS: 2.0000 | INHALATION_SPRAY | RESPIRATORY_TRACT | Status: DC | PRN

## 2014-06-18 NOTE — Telephone Encounter (Addendum)
Pt would like to know if dr burchette will call an inhaler in for pt. He states he used to have one yrs ago, but has not needed. Now they are cat-sitting his daughters cats for the holiday, and he is allergic.  It is afftecting his breathing.  Pt thinks the inhaler was alupent No appts today. But pt has appt w/ dr Elease Hashimoto on tues, 12/15  [place]

## 2014-06-18 NOTE — Telephone Encounter (Signed)
I dont see the name of the inhaler in the patient chart.

## 2014-06-18 NOTE — Telephone Encounter (Signed)
Rx sent to pharmacy   

## 2014-06-18 NOTE — Telephone Encounter (Signed)
ProAir 2 puffs every 4 hours prn cough and wheeze

## 2014-06-22 ENCOUNTER — Encounter: Payer: Self-pay | Admitting: Family Medicine

## 2014-06-22 ENCOUNTER — Ambulatory Visit: Payer: Medicare HMO | Admitting: Family Medicine

## 2014-06-22 ENCOUNTER — Ambulatory Visit (INDEPENDENT_AMBULATORY_CARE_PROVIDER_SITE_OTHER): Payer: Medicare HMO | Admitting: Family Medicine

## 2014-06-22 VITALS — BP 144/90 | HR 59 | Temp 98.4°F | Wt 186.0 lb

## 2014-06-22 DIAGNOSIS — R062 Wheezing: Secondary | ICD-10-CM

## 2014-06-22 DIAGNOSIS — J3081 Allergic rhinitis due to animal (cat) (dog) hair and dander: Secondary | ICD-10-CM

## 2014-06-22 DIAGNOSIS — I10 Essential (primary) hypertension: Secondary | ICD-10-CM

## 2014-06-22 MED ORDER — PREDNISONE 10 MG PO TABS: ORAL_TABLET | ORAL | Status: DC

## 2014-06-22 NOTE — Patient Instructions (Signed)
asmanex-one puff once daily and may increase to one puff twice daily Rinse mouth after use.

## 2014-06-22 NOTE — Progress Notes (Signed)
Pre visit review using our clinic review tool, if applicable. No additional management support is needed unless otherwise documented below in the visit note. 

## 2014-06-22 NOTE — Progress Notes (Signed)
Subjective:    Patient ID: Charles Li, male    DOB: 1940-09-18, 73 y.o.   MRN: [id number]  HPI Patient seen for follow-up. Recent poorly controlled hypertension we started back medication initially with lisinopril and subsequently amlodipine. Blood pressure very well controlled by home readings. No dizziness. No headaches. No side effects from medication.  He and his wife are keeping his daughters kittens. He's had some severe allergy problems since then with frequent nasal congestion and watery itchy eyes. Also frequent cough and wheezing and some dyspnea. He has albuterol inhaler which helps only temporarily. He has been using this frequently. They plan to get rid of those cats in about 2 weeks. He denies any recent fever or chills. He did reportedly have asthma as a child  Past Medical History  Diagnosis Date  . HYPERLIPIDEMIA 11/28/2007  . GLAUCOMA 12/12/2006  . 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 HYPERTENSION 12/12/2006  . 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 VENOUS INSUFFICIENCY 01/18/2010  . ALLERGIC RHINITIS 12/12/2006  . ASTHMA 11/28/2007  . HERNIA, VENTRAL 01/04/2009  . RENAL CALCULUS 03/24/2010  . SYNCOPE 06/08/2008  . Abdominal pain, epigastric 07/14/2009  . Leg swelling 04-24-12    occ., none at present  . Visual disturbance 04-24-12    legally blind  . Abdominal wall hernia    Past Surgical History  Procedure Laterality Date  . Cataract extraction  2009 - approximate    bilat & glaucoma surgery (7 ophth surgeries)  . Vasectomy  04-24-12  . Ventral hernia repair  05/02/2012    Procedure: LAPAROSCOPIC VENTRAL HERNIA;  Surgeon: Shann Medal, MD;  Location: [facility];  Service: General;  Laterality: N/A;  . Inguinal hernia repair  05/02/2012    Procedure: HERNIA REPAIR INGUINAL ADULT BILATERAL;  Surgeon: Shann Medal, MD;  Location: [facility];  Service: General;  Laterality: N/A;    reports that he quit smoking about 44 years ago. His smoking use included Cigarettes. He has a 21  pack-year smoking history. He has never used smokeless tobacco. He reports that he drinks about 8.4 oz of alcohol per week. He reports that he does not use illicit drugs. family history includes Birth defects in his paternal aunt; Cancer in his mother; Heart disease in his brother; Pneumonia in his father. There is no history of Colon cancer or Stomach cancer. Allergies  Allergen Reactions  . Erythromycin Rash  . Penicillins Rash  . Sulfonamide Derivatives Rash      Review of Systems  Constitutional: Negative for fever, chills and fatigue.  Eyes: Negative for visual disturbance.  Respiratory: Positive for cough, shortness of breath and wheezing. Negative for chest tightness.   Cardiovascular: Negative for chest pain, palpitations and leg swelling.  Neurological: Negative for dizziness, syncope, weakness, light-headedness and headaches.       Objective:   Physical Exam  Constitutional: He is oriented to person, place, and time. He appears well-developed and well-nourished.  HENT:  Right Ear: External ear normal.  Left Ear: External ear normal.  Mouth/Throat: Oropharynx is clear and moist.  Eyes: Pupils are equal, round, and reactive to light.  Neck: Neck supple. No thyromegaly present.  Cardiovascular: Normal rate and regular rhythm.   Pulmonary/Chest: Effort normal. No respiratory distress. He has wheezes. He has no rales.  No retractions  Musculoskeletal: He exhibits no edema.  Neurological: He is alert and oriented to person, place, and time.          Assessment & Plan:  #1 hypertension.  Improved and at goal by home readings. Continue current medications #2 wheezing and multiple allergy symptoms as above triggered by cat dander. We explained the importance of avoidance. Start prednisone taper over one week. Samples of Asmanex inhaler 220 g 1 puff once daily and instructed to rinse mouth after use. Continue Proventil rescue inhaler as needed.

## 2014-06-25 ENCOUNTER — Ambulatory Visit: Payer: Medicare HMO | Admitting: Family Medicine

## 2014-08-06 ENCOUNTER — Encounter: Payer: Self-pay | Admitting: Family Medicine

## 2014-08-06 ENCOUNTER — Ambulatory Visit (INDEPENDENT_AMBULATORY_CARE_PROVIDER_SITE_OTHER): Payer: Medicare HMO | Admitting: Family Medicine

## 2014-08-06 VITALS — BP 128/70 | HR 60 | Temp 98.0°F | Wt 188.0 lb

## 2014-08-06 DIAGNOSIS — I10 Essential (primary) hypertension: Secondary | ICD-10-CM

## 2014-08-06 MED ORDER — PREDNISONE 10 MG PO TABS: ORAL_TABLET | ORAL | Status: DC

## 2014-08-06 NOTE — Progress Notes (Signed)
Pre visit review using our clinic review tool, if applicable. No additional management support is needed unless otherwise documented below in the visit note. 

## 2014-08-06 NOTE — Progress Notes (Signed)
   Subjective:    Patient ID: Charles Li, male    DOB: [birth year], 74 y.o.   MRN: [id number]  HPI  Patient seen for routine follow-up. Hypertension which recently was poorly controlled. He is currently taking benazepril -HCTZ and amlodipine. Blood pressures been stable. No headaches. No dizziness.  Recent had some cough and wheezing. He was exposed to some cats he had been watching for his daughter. He is now away from exposure from the cats and is doing better. He was briefly on prednisone as well as Asmanex inhaler which controlled his symptoms very well. He has been able to stop Asmanex at this time.  Past Medical History  Diagnosis Date  . HYPERLIPIDEMIA 11/28/2007  . GLAUCOMA 12/12/2006  . 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 HYPERTENSION 12/12/2006  . 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 VENOUS INSUFFICIENCY 01/18/2010  . ALLERGIC RHINITIS 12/12/2006  . ASTHMA 11/28/2007  . HERNIA, VENTRAL 01/04/2009  . RENAL CALCULUS 03/24/2010  . SYNCOPE 06/08/2008  . Abdominal pain, epigastric 07/14/2009  . Leg swelling 04-24-12    occ., none at present  . Visual disturbance 04-24-12    legally blind  . Abdominal wall hernia    Past Surgical History  Procedure Laterality Date  . Cataract extraction  2009 - approximate    bilat & glaucoma surgery (7 ophth surgeries)  . Vasectomy  04-24-12  . Ventral hernia repair  05/02/2012    Procedure: LAPAROSCOPIC VENTRAL HERNIA;  Surgeon: Shann Medal, MD;  Location: [facility];  Service: General;  Laterality: N/A;  . Inguinal hernia repair  05/02/2012    Procedure: HERNIA REPAIR INGUINAL ADULT BILATERAL;  Surgeon: Shann Medal, MD;  Location: [facility];  Service: General;  Laterality: N/A;    reports that he quit smoking about 44 years ago. His smoking use included Cigarettes. He has a 21 pack-year smoking history. He has never used smokeless tobacco. He reports that he drinks about 8.4 oz of alcohol Charles week. He reports that he does not use illicit drugs. family  history includes Birth defects in his paternal aunt; Cancer in his mother; Heart disease in his brother; Pneumonia in his father. There is no history of Colon cancer or Stomach cancer. Allergies  Allergen Reactions  . Erythromycin Rash  . Penicillins Rash  . Sulfonamide Derivatives Rash     Review of Systems  Constitutional: Negative for fatigue.  Eyes: Negative for visual disturbance.  Respiratory: Negative for cough, chest tightness and shortness of breath.   Cardiovascular: Negative for chest pain, palpitations and leg swelling.  Neurological: Negative for dizziness, syncope, weakness, light-headedness and headaches.       Objective:   Physical Exam  Constitutional: He is oriented to person, place, and time. He appears well-developed and well-nourished.  HENT:  Right Ear: External ear normal.  Left Ear: External ear normal.  Mouth/Throat: Oropharynx is clear and moist.  Eyes: Pupils are equal, round, and reactive to light.  Neck: Neck supple. No thyromegaly present.  Cardiovascular: Normal rate and regular rhythm.   Pulmonary/Chest: Effort normal and breath sounds normal. No respiratory distress. He has no wheezes. He has no rales.  Musculoskeletal: He exhibits no edema.  Neurological: He is alert and oriented to person, place, and time.          Assessment & Plan:  #1 hypertension. Improved. Continue medications as above. Routine follow-up in 6 months and sooner as needed.

## 2014-09-08 ENCOUNTER — Other Ambulatory Visit: Payer: Self-pay | Admitting: Dermatology

## 2014-12-28 ENCOUNTER — Encounter: Payer: Self-pay | Admitting: Family Medicine

## 2014-12-28 ENCOUNTER — Ambulatory Visit (INDEPENDENT_AMBULATORY_CARE_PROVIDER_SITE_OTHER): Payer: Medicare HMO | Admitting: Family Medicine

## 2014-12-28 VITALS — BP 128/74 | HR 63 | Temp 97.8°F | Wt 192.0 lb

## 2014-12-28 DIAGNOSIS — G5711 Meralgia paresthetica, right lower limb: Secondary | ICD-10-CM

## 2014-12-28 NOTE — Progress Notes (Signed)
Pre visit review using our clinic review tool, if applicable. No additional management support is needed unless otherwise documented below in the visit note. 

## 2014-12-28 NOTE — Progress Notes (Signed)
   Subjective:    Patient ID: Charles Li, male    DOB: 1940-09-01, 74 y.o.   MRN: [id number]  HPI Right upper lateral thigh pain and dysesthesias over the past 3-4 weeks. No injury. No loss of urine or stool control. Symptoms are somewhat intermittent. No rash. No groin or hip pain. Denies any low back pain. No lower extremity weakness. Symptoms are relatively mild. Not impairing gait or ambulation.  Past Medical History  Diagnosis Date  . HYPERLIPIDEMIA 11/28/2007  . GLAUCOMA 12/12/2006  . 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 HYPERTENSION 12/12/2006  . 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 VENOUS INSUFFICIENCY 01/18/2010  . ALLERGIC RHINITIS 12/12/2006  . ASTHMA 11/28/2007  . HERNIA, VENTRAL 01/04/2009  . RENAL CALCULUS 03/24/2010  . SYNCOPE 06/08/2008  . Abdominal pain, epigastric 07/14/2009  . Leg swelling 04-24-12    occ., none at present  . Visual disturbance 04-24-12    legally blind  . Abdominal wall hernia    Past Surgical History  Procedure Laterality Date  . Cataract extraction  2009 - approximate    bilat & glaucoma surgery (7 ophth surgeries)  . Vasectomy  04-24-12  . Ventral hernia repair  05/02/2012    Procedure: LAPAROSCOPIC VENTRAL HERNIA;  Surgeon: Shann Medal, MD;  Location: [facility];  Service: General;  Laterality: N/A;  . Inguinal hernia repair  05/02/2012    Procedure: HERNIA REPAIR INGUINAL ADULT BILATERAL;  Surgeon: Shann Medal, MD;  Location: [facility];  Service: General;  Laterality: N/A;    reports that he quit smoking about 45 years ago. His smoking use included Cigarettes. He has a 21 pack-year smoking history. He has never used smokeless tobacco. He reports that he drinks about 8.4 oz of alcohol per week. He reports that he does not use illicit drugs. family history includes Birth defects in his paternal aunt; Cancer in his mother; Heart disease in his brother; Pneumonia in his father. There is no history of Colon cancer or Stomach cancer. Allergies  Allergen  Reactions  . Erythromycin Rash  . Penicillins Rash  . Sulfonamide Derivatives Rash      Review of Systems  Constitutional: Negative for fever, appetite change and unexpected weight change.  Musculoskeletal: Negative for back pain.  Skin: Negative for rash.  Neurological: Positive for numbness. Negative for weakness.       Objective:   Physical Exam  Constitutional: He appears well-developed and well-nourished. No distress.  Cardiovascular: Normal rate and regular rhythm.   Pulmonary/Chest: Effort normal and breath sounds normal. No respiratory distress. He has no wheezes.  Musculoskeletal:  No edema right lower extremity other than resolving hematoma right lower leg. Full range of motion right hip. No localized tenderness palpation over the thigh  Neurological:  Full-strength lower extremities and symmetric reflexes. Mild sensory impairment touch left upper lateral anterior thigh  Skin: No rash noted.          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 He seems to have a strictly sensory phenomenon-no weakness. Reassurance. No evidence to suggest likely lumbar radiculitis. We explained this is usually self-limited. Observe for now. Consider referral to specialist if symptoms worsen or persist.

## 2015-01-24 ENCOUNTER — Other Ambulatory Visit: Payer: Self-pay | Admitting: Family Medicine

## 2015-02-09 ENCOUNTER — Telehealth: Payer: Self-pay | Admitting: Family Medicine

## 2015-02-09 NOTE — Telephone Encounter (Signed)
Can you please call patient to schedule CPE. Thank you

## 2015-02-09 NOTE — Telephone Encounter (Signed)
Pt wants to know would you like for him to come in for a physical before he leaves. Pt was last seen 12/2014

## 2015-02-09 NOTE — Telephone Encounter (Signed)
Pt call to say he is moving in a month and is asking if there is something Dr Elease Hashimoto would like to do before he leave.

## 2015-02-09 NOTE — Telephone Encounter (Signed)
Yes I think that would be a good idea.

## 2015-02-23 ENCOUNTER — Other Ambulatory Visit (INDEPENDENT_AMBULATORY_CARE_PROVIDER_SITE_OTHER): Payer: Medicare HMO

## 2015-02-23 DIAGNOSIS — Z0000 Encounter for general adult medical examination without abnormal findings: Secondary | ICD-10-CM

## 2015-02-23 DIAGNOSIS — E785 Hyperlipidemia, unspecified: Secondary | ICD-10-CM

## 2015-02-23 DIAGNOSIS — I10 Essential (primary) hypertension: Secondary | ICD-10-CM

## 2015-02-23 DIAGNOSIS — Z125 Encounter for screening for malignant neoplasm of prostate: Secondary | ICD-10-CM | POA: Diagnosis not present

## 2015-02-23 LAB — CBC WITH DIFFERENTIAL/PLATELET
BASOS ABS: 0 10*3/uL (ref 0.0–0.1)
Basophils Relative: 0.5 % (ref 0.0–3.0)
EOS ABS: 0.2 10*3/uL (ref 0.0–0.7)
Eosinophils Relative: 3 % (ref 0.0–5.0)
HEMATOCRIT: 43.1 % (ref 39.0–52.0)
Hemoglobin: 14.5 g/dL (ref 13.0–17.0)
LYMPHS PCT: 25.7 % (ref 12.0–46.0)
Lymphs Abs: 2.1 10*3/uL (ref 0.7–4.0)
MCHC: 33.5 g/dL (ref 30.0–36.0)
MCV: 96.8 fl (ref 78.0–100.0)
MONOS PCT: 8.8 % (ref 3.0–12.0)
Monocytes Absolute: 0.7 10*3/uL (ref 0.1–1.0)
Neutro Abs: 5.1 10*3/uL (ref 1.4–7.7)
Neutrophils Relative %: 62 % (ref 43.0–77.0)
Platelets: 241 10*3/uL (ref 150.0–400.0)
RBC: 4.45 Mil/uL (ref 4.22–5.81)
RDW: 12.9 % (ref 11.5–15.5)
WBC: 8.2 10*3/uL (ref 4.0–10.5)

## 2015-02-23 LAB — HEPATIC FUNCTION PANEL
ALK PHOS: 42 U/L (ref 39–117)
ALT: 13 U/L (ref 0–53)
AST: 15 U/L (ref 0–37)
Albumin: 4.1 g/dL (ref 3.5–5.2)
Bilirubin, Direct: 0.2 mg/dL (ref 0.0–0.3)
TOTAL PROTEIN: 6.1 g/dL (ref 6.0–8.3)
Total Bilirubin: 1 mg/dL (ref 0.2–1.2)

## 2015-02-23 LAB — LIPID PANEL
Cholesterol: 126 mg/dL (ref 0–200)
HDL: 55.7 mg/dL (ref >39.00)
LDL CALC: 59 mg/dL (ref 0–99)
NONHDL: 70.24
Total CHOL/HDL Ratio: 2
Triglycerides: 55 mg/dL (ref 0.0–149.0)
VLDL: 11 mg/dL (ref 0.0–40.0)

## 2015-02-23 LAB — BASIC METABOLIC PANEL
BUN: 18 mg/dL (ref 6–23)
CHLORIDE: 104 meq/L (ref 96–112)
CO2: 33 mEq/L — ABNORMAL HIGH (ref 19–32)
CREATININE: 0.76 mg/dL (ref 0.40–1.50)
Calcium: 9.5 mg/dL (ref 8.4–10.5)
GFR: 106.54 mL/min (ref >60.00)
Glucose, Bld: 112 mg/dL — ABNORMAL HIGH (ref 70–99)
Potassium: 4.4 mEq/L (ref 3.5–5.1)
SODIUM: 142 meq/L (ref 135–145)

## 2015-02-23 LAB — TSH: TSH: 0.59 u[IU]/mL (ref 0.35–4.50)

## 2015-02-23 LAB — PSA: PSA: 0.54 ng/mL (ref 0.10–4.00)

## 2015-03-02 ENCOUNTER — Ambulatory Visit (INDEPENDENT_AMBULATORY_CARE_PROVIDER_SITE_OTHER): Payer: Medicare HMO | Admitting: Family Medicine

## 2015-03-02 ENCOUNTER — Encounter: Payer: Self-pay | Admitting: Family Medicine

## 2015-03-02 VITALS — BP 128/70 | HR 60 | Temp 97.7°F | Ht 70.0 in | Wt 191.0 lb

## 2015-03-02 DIAGNOSIS — Z0000 Encounter for general adult medical examination without abnormal findings: Secondary | ICD-10-CM

## 2015-03-02 DIAGNOSIS — Z23 Encounter for immunization: Secondary | ICD-10-CM

## 2015-03-02 NOTE — Progress Notes (Signed)
Subjective:    Patient ID: Charles Li, male    DOB: [birth year], 74 y.o.   MRN: [id number]  HPI Patient seen for complete physical.  Chronic problems include history of hyperlipidemia, hypertension, glaucoma, legally blind, history of kidney stones, BPH. He's had prior shingles vaccine. He is due for repeat tetanus. Colonoscopy last year. Needs Prevnar 13. Plans to get flu vaccine later. He and his wife will be moving to [place] with the next couple weeks to be closer to his daughter and son-in-law. His blood pressure had been up this past year and with additional medication now well controlled  Past Medical History  Diagnosis Date  . HYPERLIPIDEMIA 11/28/2007  . GLAUCOMA 12/12/2006  . 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 HYPERTENSION 12/12/2006  . 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 VENOUS INSUFFICIENCY 01/18/2010  . ALLERGIC RHINITIS 12/12/2006  . ASTHMA 11/28/2007  . HERNIA, VENTRAL 01/04/2009  . RENAL CALCULUS 03/24/2010  . SYNCOPE 06/08/2008  . Abdominal pain, epigastric 07/14/2009  . Leg swelling 04-24-12    occ., none at present  . Visual disturbance 04-24-12    legally blind  . Abdominal wall hernia    Past Surgical History  Procedure Laterality Date  . Cataract extraction  2009 - approximate    bilat & glaucoma surgery (7 ophth surgeries)  . Vasectomy  04-24-12  . Ventral hernia repair  05/02/2012    Procedure: LAPAROSCOPIC VENTRAL HERNIA;  Surgeon: Shann Medal, MD;  Location: [facility];  Service: General;  Laterality: N/A;  . Inguinal hernia repair  05/02/2012    Procedure: HERNIA REPAIR INGUINAL ADULT BILATERAL;  Surgeon: Shann Medal, MD;  Location: [facility];  Service: General;  Laterality: N/A;    reports that he quit smoking about 45 years ago. His smoking use included Cigarettes. He has a 21 pack-year smoking history. He has never used smokeless tobacco. He reports that he drinks about 8.4 oz of alcohol per week. He reports that he does not use illicit drugs. family  history includes Birth defects in his paternal aunt; Cancer in his mother; Heart disease in his brother; Pneumonia in his father. There is no history of Colon cancer or Stomach cancer. Allergies  Allergen Reactions  . Erythromycin Rash  . Penicillins Rash  . Sulfonamide Derivatives Rash      Review of Systems  Constitutional: Negative for fever, activity change, appetite change and fatigue.  HENT: Negative for congestion, ear pain and trouble swallowing.   Eyes: Positive for visual disturbance. Negative for pain.  Respiratory: Negative for cough, shortness of breath and wheezing.   Cardiovascular: Negative for chest pain and palpitations.  Gastrointestinal: Negative for nausea, vomiting, abdominal pain, diarrhea, constipation, blood in stool, abdominal distention and rectal pain.  Endocrine: Negative for polydipsia and polyuria.  Genitourinary: Negative for dysuria, hematuria and testicular pain.  Musculoskeletal: Positive for arthralgias. Negative for joint swelling.  Skin: Negative for rash.  Neurological: Negative for dizziness, syncope and headaches.  Hematological: Negative for adenopathy.  Psychiatric/Behavioral: Negative for confusion and dysphoric mood.       Objective:   Physical Exam  Constitutional: He is oriented to person, place, and time. He appears well-developed and well-nourished. No distress.  HENT:  Head: Normocephalic and atraumatic.  Right Ear: External ear normal.  Left Ear: External ear normal.  Mouth/Throat: Oropharynx is clear and moist.  Eyes: Conjunctivae and EOM are normal. Pupils are equal, round, and reactive to light.  Neck: Normal range of motion. Neck supple. No thyromegaly  present.  Cardiovascular: Normal rate, regular rhythm and normal heart sounds.   No murmur heard. Pulmonary/Chest: No respiratory distress. He has no wheezes. He has no rales.  Abdominal: Soft. Bowel sounds are normal. He exhibits no distension and no mass. There is no  tenderness. There is no rebound and no guarding.  Musculoskeletal: He exhibits no edema.  Lymphadenopathy:    He has no cervical adenopathy.  Neurological: He is alert and oriented to person, place, and time. He displays normal reflexes. No cranial nerve deficit.  Skin: No rash noted.  Psychiatric: He has a normal mood and affect.          Assessment & Plan:  Complete physical. Labs reviewed with no major concerns. Prevnar 13 and tetanus booster given. Reminder for flu vaccine this winter. Other screenings up-to-date.

## 2015-03-02 NOTE — Progress Notes (Signed)
Pre visit review using our clinic review tool, if applicable. No additional management support is needed unless otherwise documented below in the visit note. 

## 2015-04-18 ENCOUNTER — Other Ambulatory Visit: Payer: Self-pay | Admitting: Family Medicine

## 2015-06-09 ENCOUNTER — Other Ambulatory Visit: Payer: Self-pay | Admitting: Family Medicine

## 2015-07-20 ENCOUNTER — Other Ambulatory Visit: Payer: Self-pay | Admitting: Family Medicine

## 2015-08-09 ENCOUNTER — Other Ambulatory Visit: Payer: Self-pay | Admitting: Family Medicine

## 2016-01-20 ENCOUNTER — Other Ambulatory Visit: Payer: Self-pay | Admitting: Family Medicine

## 2016-02-01 ENCOUNTER — Other Ambulatory Visit: Payer: Self-pay | Admitting: Family Medicine

## 2016-02-13 ENCOUNTER — Telehealth: Payer: Self-pay | Admitting: Family Medicine

## 2016-02-13 ENCOUNTER — Other Ambulatory Visit: Payer: Self-pay | Admitting: Family Medicine

## 2016-02-13 MED ORDER — BENAZEPRIL-HYDROCHLOROTHIAZIDE 20-12.5 MG PO TABS: 1.0000 | ORAL_TABLET | Freq: Every day | ORAL | 0 refills | Status: DC

## 2016-02-13 NOTE — Telephone Encounter (Signed)
Pt need new Rx benazepril hydrochlorthiazide (HCT) Pharm:  [facility]

## 2016-02-13 NOTE — Telephone Encounter (Signed)
Medication sent in x1 month. He needs an appt for any further refills.

## 2016-04-29 ENCOUNTER — Other Ambulatory Visit: Payer: Self-pay | Admitting: Family Medicine

## 2017-05-10 ENCOUNTER — Ambulatory Visit (INDEPENDENT_AMBULATORY_CARE_PROVIDER_SITE_OTHER): Payer: Medicare HMO

## 2017-05-10 DIAGNOSIS — Z23 Encounter for immunization: Secondary | ICD-10-CM | POA: Diagnosis not present

## 2017-05-21 ENCOUNTER — Ambulatory Visit (INDEPENDENT_AMBULATORY_CARE_PROVIDER_SITE_OTHER): Payer: Medicare HMO | Admitting: Family Medicine

## 2017-05-21 ENCOUNTER — Encounter: Payer: Self-pay | Admitting: Family Medicine

## 2017-05-21 VITALS — BP 138/64 | HR 50 | Temp 97.9°F | Ht 69.5 in | Wt 191.5 lb

## 2017-05-21 DIAGNOSIS — E785 Hyperlipidemia, unspecified: Secondary | ICD-10-CM | POA: Diagnosis not present

## 2017-05-21 DIAGNOSIS — I10 Essential (primary) hypertension: Secondary | ICD-10-CM | POA: Diagnosis not present

## 2017-05-21 DIAGNOSIS — H409 Unspecified glaucoma: Secondary | ICD-10-CM

## 2017-05-21 LAB — BASIC METABOLIC PANEL
BUN: 18 mg/dL (ref 6–23)
CHLORIDE: 101 meq/L (ref 96–112)
CO2: 33 mEq/L — ABNORMAL HIGH (ref 19–32)
Calcium: 9.4 mg/dL (ref 8.4–10.5)
Creatinine, Ser: 0.86 mg/dL (ref 0.40–1.50)
GFR: 91.81 mL/min (ref >60.00)
GLUCOSE: 110 mg/dL — AB (ref 70–99)
POTASSIUM: 3.6 meq/L (ref 3.5–5.1)
SODIUM: 140 meq/L (ref 135–145)

## 2017-05-21 LAB — HEPATIC FUNCTION PANEL
ALT: 15 U/L (ref 0–53)
AST: 15 U/L (ref 0–37)
Albumin: 4.1 g/dL (ref 3.5–5.2)
Alkaline Phosphatase: 45 U/L (ref 39–117)
BILIRUBIN TOTAL: 0.9 mg/dL (ref 0.2–1.2)
Bilirubin, Direct: 0.3 mg/dL (ref 0.0–0.3)
Total Protein: 6.5 g/dL (ref 6.0–8.3)

## 2017-05-21 LAB — LIPID PANEL
Cholesterol: 124 mg/dL (ref 0–200)
HDL: 60.6 mg/dL (ref >39.00)
LDL Cholesterol: 53 mg/dL (ref 0–99)
NONHDL: 62.97
Total CHOL/HDL Ratio: 2
Triglycerides: 52 mg/dL (ref 0.0–149.0)
VLDL: 10.4 mg/dL (ref 0.0–40.0)

## 2017-05-21 MED ORDER — AMLODIPINE BESYLATE 5 MG PO TABS: 5.0000 mg | ORAL_TABLET | Freq: Every day | ORAL | 3 refills | Status: DC

## 2017-05-21 MED ORDER — PRAVASTATIN SODIUM 40 MG PO TABS: 40.0000 mg | ORAL_TABLET | Freq: Every day | ORAL | 3 refills | Status: DC

## 2017-05-21 MED ORDER — BENAZEPRIL-HYDROCHLOROTHIAZIDE 20-12.5 MG PO TABS: 1.0000 | ORAL_TABLET | Freq: Every day | ORAL | 3 refills | Status: DC

## 2017-05-21 NOTE — Progress Notes (Signed)
Subjective:     Patient ID: Charles Li, male   DOB: [birth year], 76 y.o.   MRN: [id number]  HPI Patient seen for medical follow-up. He and his wife just moved back from [place] about a month ago. They were there to be around family. He is reestablishing with our practice. He states he had some lab workup there about a year ago but is not sure of exact date. In looking over prevention issues he needs Pneumovax. He had Prevnar 13 couple years ago. His regular medications include amlodipine 5 mg daily, pravastatin 40 mg daily, and benazepril HCTZ 20/12.5 mg 1 daily and baby aspirin 81 mg daily.  He has history of glaucoma and is legally blind. Follow closely by ophthalmology. Has not been able to drive for quite some time. Needs refills of all medications today. No recent chest pains. No recent fall.  Past Medical History:  Diagnosis Date  . Abdominal pain, epigastric 07/14/2009  . Abdominal wall hernia   . ALLERGIC RHINITIS 12/12/2006  . ASTHMA 11/28/2007  . 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 GLAUCOMA 12/12/2006  . 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 HERNIA, VENTRAL 01/04/2009  . HYPERLIPIDEMIA 11/28/2007  . HYPERTENSION 12/12/2006  . Leg swelling 04-24-12   occ., none at present  . RENAL CALCULUS 03/24/2010  . SYNCOPE 06/08/2008  . VENOUS INSUFFICIENCY 01/18/2010  . Visual disturbance 04-24-12   legally blind   Past Surgical History:  Procedure Laterality Date  . CATARACT EXTRACTION  2009 - approximate   bilat & glaucoma surgery (7 ophth surgeries)  . VASECTOMY  04-24-12    reports that he quit smoking about 47 years ago. His smoking use included cigarettes. He has a 21.00 pack-year smoking history. he has never used smokeless tobacco. He reports that he drinks about 8.4 oz of alcohol per week. He reports that he does not use drugs. family history includes Birth defects in his paternal aunt; Cancer in his mother; Heart disease in his brother; Pneumonia in his father. Allergies   Allergen Reactions  . Erythromycin Rash  . Penicillins Rash  . Sulfonamide Derivatives Rash     Review of Systems  Constitutional: Negative for appetite change, fatigue and unexpected weight change.  Eyes: Negative for visual disturbance.  Respiratory: Negative for cough, chest tightness and shortness of breath.   Cardiovascular: Negative for chest pain, palpitations and leg swelling.  Endocrine: Negative for polydipsia and polyuria.  Neurological: Negative for dizziness, syncope, weakness, light-headedness and headaches.  Psychiatric/Behavioral: Negative for confusion.       Objective:   Physical Exam  Constitutional: He is oriented to person, place, and time. He appears well-developed and well-nourished.  HENT:  Right Ear: External ear normal.  Left Ear: External ear normal.  Mouth/Throat: Oropharynx is clear and moist.  Neck: Neck supple. No thyromegaly present.  Cardiovascular: Normal rate and regular rhythm.  Pulmonary/Chest: Effort normal and breath sounds normal. No respiratory distress. He has no wheezes. He has no rales.  Musculoskeletal:  Only trace edema ankles bilaterally.  Neurological: He is alert and oriented to person, place, and time.       Assessment:     #1 hypertension. Initial reading here was 160/80 and repeat after rest right arm seated 138/64. This is in line with several home readings he has had which are generally 275T systolic.  #2 hyperlipidemia-on pravastatin  #3 health maintenance. Needs Pneumovax    Plan:     -Check labs with basic metabolic panel, hepatic panel, lipid panel -  Pneumovax given -Refilled regular medications for one year -Routine follow-up in 6 months and sooner as needed  Eulas Post MD [facility]

## 2017-06-11 DIAGNOSIS — L821 Other seborrheic keratosis: Secondary | ICD-10-CM | POA: Diagnosis not present

## 2017-06-11 DIAGNOSIS — D692 Other nonthrombocytopenic purpura: Secondary | ICD-10-CM | POA: Diagnosis not present

## 2017-06-11 DIAGNOSIS — L438 Other lichen planus: Secondary | ICD-10-CM | POA: Diagnosis not present

## 2017-06-11 DIAGNOSIS — L570 Actinic keratosis: Secondary | ICD-10-CM | POA: Diagnosis not present

## 2017-06-25 ENCOUNTER — Telehealth: Payer: Self-pay | Admitting: Family Medicine

## 2017-06-25 NOTE — Telephone Encounter (Signed)
Copied from [place]. Topic: Quick Communication - See Telephone Encounter >> Jun 25, 2017  4:30 PM [name], NT wrote: CRM for notification. See Telephone encounter for: Pt  called in to  see if the doctor can give him a form that says that the pt is legally blind so he can get a handi cap sticker.  06/25/17.

## 2017-06-26 NOTE — Telephone Encounter (Signed)
Form placed up front for pick up.

## 2017-06-26 NOTE — Telephone Encounter (Signed)
Copied from [place]. Topic: Quick Communication - See Telephone Encounter >> Jun 26, 2017  3:34 PM Charles Li wrote: Pt called to inform the his wife will come pick up form tomorrow DO NOT mail it

## 2017-06-26 NOTE — Telephone Encounter (Signed)
We can fill out form for handicapped sticker-

## 2017-07-22 DIAGNOSIS — H401133 Primary open-angle glaucoma, bilateral, severe stage: Secondary | ICD-10-CM | POA: Diagnosis not present

## 2017-07-22 DIAGNOSIS — H01111 Allergic dermatitis of right upper eyelid: Secondary | ICD-10-CM | POA: Diagnosis not present

## 2017-07-22 DIAGNOSIS — H01112 Allergic dermatitis of right lower eyelid: Secondary | ICD-10-CM | POA: Diagnosis not present

## 2017-07-22 DIAGNOSIS — H01115 Allergic dermatitis of left lower eyelid: Secondary | ICD-10-CM | POA: Diagnosis not present

## 2017-07-22 DIAGNOSIS — H01114 Allergic dermatitis of left upper eyelid: Secondary | ICD-10-CM | POA: Diagnosis not present

## 2017-08-26 ENCOUNTER — Telehealth: Payer: Self-pay | Admitting: *Deleted

## 2017-08-26 NOTE — Telephone Encounter (Signed)
Appointment made

## 2017-08-26 NOTE — Telephone Encounter (Signed)
Patient is complain of cough.  It started about 2 1/2 weeks ago with flu like symptoms with no fever.  His symptoms have improved, but he continues to have chest congestion with a cough.  He has tried Mucinex with some improvement.  He would like to know if a z-pak or something be sent to the pharmacy for the cough?  [facility].

## 2017-08-26 NOTE — Telephone Encounter (Signed)
Recommend office follow up .Marland Kitchen  This is likely viral and sometimes cough can last for several weeks but really need to check to be sure

## 2017-08-27 ENCOUNTER — Ambulatory Visit (INDEPENDENT_AMBULATORY_CARE_PROVIDER_SITE_OTHER): Payer: Medicare HMO | Admitting: Family Medicine

## 2017-08-27 ENCOUNTER — Encounter: Payer: Self-pay | Admitting: Family Medicine

## 2017-08-27 VITALS — BP 130/70 | HR 50 | Temp 98.0°F | Wt 186.0 lb

## 2017-08-27 DIAGNOSIS — J209 Acute bronchitis, unspecified: Secondary | ICD-10-CM | POA: Diagnosis not present

## 2017-08-27 NOTE — Progress Notes (Signed)
Subjective:     Patient ID: Charles Li, male   DOB: 1940-08-04, 77 y.o.   MRN: [id number]  HPI Patient seen with almost 2 week history of cough. Cough is occasionally productive of clear sputum. He started off with sore throat and headache symptoms. Sore throat resolved several days ago. No fever. No dyspnea. No wheezing. Taken some Mucinex with some relief. Cough is relatively mild at night. No chest pain. Patient quit smoking 1971 after about 21 pack year history. No recent hemoptysis. No recent appetite or weight changes.  Past Medical History:  Diagnosis Date  . Abdominal pain, epigastric 07/14/2009  . Abdominal wall hernia   . ALLERGIC RHINITIS 12/12/2006  . ASTHMA 11/28/2007  . 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 GLAUCOMA 12/12/2006  . 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 HERNIA, VENTRAL 01/04/2009  . HYPERLIPIDEMIA 11/28/2007  . HYPERTENSION 12/12/2006  . Leg swelling 04-24-12   occ., none at present  . RENAL CALCULUS 03/24/2010  . SYNCOPE 06/08/2008  . VENOUS INSUFFICIENCY 01/18/2010  . Visual disturbance 04-24-12   legally blind   Past Surgical History:  Procedure Laterality Date  . CATARACT EXTRACTION  2009 - approximate   bilat & glaucoma surgery (7 ophth surgeries)  . INGUINAL HERNIA REPAIR  05/02/2012   Procedure: HERNIA REPAIR INGUINAL ADULT BILATERAL;  Surgeon: Shann Medal, MD;  Location: [facility];  Service: General;  Laterality: N/A;  . VASECTOMY  04-24-12  . VENTRAL HERNIA REPAIR  05/02/2012   Procedure: LAPAROSCOPIC VENTRAL HERNIA;  Surgeon: Shann Medal, MD;  Location: [facility];  Service: General;  Laterality: N/A;    reports that he quit smoking about 47 years ago. His smoking use included cigarettes. He has a 21.00 pack-year smoking history. he has never used smokeless tobacco. He reports that he drinks about 8.4 oz of alcohol per week. He reports that he does not use drugs. family history includes Birth defects in his paternal aunt; Cancer in his mother; Heart disease in  his brother; Pneumonia in his father. Allergies  Allergen Reactions  . Erythromycin Rash  . Penicillins Rash  . Sulfonamide Derivatives Rash     Review of Systems  Constitutional: Negative for chills and fever.  HENT: Positive for congestion. Negative for sinus pressure, sinus pain and sore throat.   Respiratory: Positive for cough. Negative for shortness of breath and wheezing.        Objective:   Physical Exam  Constitutional: He appears well-developed and well-nourished. No distress.  HENT:  Right Ear: External ear normal.  Left Ear: External ear normal.  Mouth/Throat: Oropharynx is clear and moist.  Neck: Neck supple.  Cardiovascular: Normal rate and regular rhythm.  Pulmonary/Chest: Effort normal and breath sounds normal. No respiratory distress. He has no wheezes. He has no rales.  Lymphadenopathy:    He has no cervical adenopathy.       Assessment:     Acute bronchitis. Suspect viral origin    Plan:     -Continue Mucinex and plenty of fluids. Follow-up promptly for any fever, increased shortness breath, or other concerns. Also encouraged to touch base in 2 weeks if cough not resolved  Eulas Post MD [facility] at [facility]

## 2017-08-27 NOTE — Patient Instructions (Signed)

## 2017-12-09 DIAGNOSIS — H01112 Allergic dermatitis of right lower eyelid: Secondary | ICD-10-CM | POA: Diagnosis not present

## 2017-12-09 DIAGNOSIS — H401133 Primary open-angle glaucoma, bilateral, severe stage: Secondary | ICD-10-CM | POA: Diagnosis not present

## 2017-12-09 DIAGNOSIS — H01111 Allergic dermatitis of right upper eyelid: Secondary | ICD-10-CM | POA: Diagnosis not present

## 2017-12-09 DIAGNOSIS — H01115 Allergic dermatitis of left lower eyelid: Secondary | ICD-10-CM | POA: Diagnosis not present

## 2017-12-09 DIAGNOSIS — H01114 Allergic dermatitis of left upper eyelid: Secondary | ICD-10-CM | POA: Diagnosis not present

## 2018-03-24 DIAGNOSIS — H01111 Allergic dermatitis of right upper eyelid: Secondary | ICD-10-CM | POA: Diagnosis not present

## 2018-03-24 DIAGNOSIS — H01115 Allergic dermatitis of left lower eyelid: Secondary | ICD-10-CM | POA: Diagnosis not present

## 2018-03-24 DIAGNOSIS — H01114 Allergic dermatitis of left upper eyelid: Secondary | ICD-10-CM | POA: Diagnosis not present

## 2018-03-24 DIAGNOSIS — H01112 Allergic dermatitis of right lower eyelid: Secondary | ICD-10-CM | POA: Diagnosis not present

## 2018-03-24 DIAGNOSIS — H401133 Primary open-angle glaucoma, bilateral, severe stage: Secondary | ICD-10-CM | POA: Diagnosis not present

## 2018-05-16 ENCOUNTER — Other Ambulatory Visit: Payer: Self-pay | Admitting: Family Medicine

## 2018-06-09 ENCOUNTER — Ambulatory Visit (INDEPENDENT_AMBULATORY_CARE_PROVIDER_SITE_OTHER): Payer: Medicare HMO

## 2018-06-09 DIAGNOSIS — Z23 Encounter for immunization: Secondary | ICD-10-CM

## 2018-06-17 ENCOUNTER — Other Ambulatory Visit: Payer: Self-pay | Admitting: Family Medicine

## 2018-07-06 ENCOUNTER — Telehealth: Payer: Self-pay | Admitting: Family Medicine

## 2018-07-07 NOTE — Telephone Encounter (Signed)
Please advise if a 15 tablet refill is OK?

## 2018-07-07 NOTE — Telephone Encounter (Signed)
Pt is requesting a partial refill until his appt on 1/6. Please advise AMLODIPINE BESYLATE 5 MG TAB]

## 2018-07-07 NOTE — Telephone Encounter (Signed)
Refill OK

## 2018-07-08 ENCOUNTER — Other Ambulatory Visit: Payer: Self-pay

## 2018-07-08 MED ORDER — AMLODIPINE BESYLATE 5 MG PO TABS: 5.0000 mg | ORAL_TABLET | Freq: Every day | ORAL | 0 refills | Status: DC

## 2018-07-08 NOTE — Telephone Encounter (Signed)
[facility]

## 2018-07-14 ENCOUNTER — Ambulatory Visit (INDEPENDENT_AMBULATORY_CARE_PROVIDER_SITE_OTHER): Payer: Medicare HMO | Admitting: Family Medicine

## 2018-07-14 ENCOUNTER — Other Ambulatory Visit: Payer: Self-pay

## 2018-07-14 ENCOUNTER — Encounter: Payer: Self-pay | Admitting: Family Medicine

## 2018-07-14 VITALS — BP 116/64 | HR 61 | Temp 97.9°F | Wt 189.0 lb

## 2018-07-14 DIAGNOSIS — H3530 Unspecified macular degeneration: Secondary | ICD-10-CM | POA: Diagnosis not present

## 2018-07-14 DIAGNOSIS — I10 Essential (primary) hypertension: Secondary | ICD-10-CM

## 2018-07-14 DIAGNOSIS — H401133 Primary open-angle glaucoma, bilateral, severe stage: Secondary | ICD-10-CM | POA: Diagnosis not present

## 2018-07-14 DIAGNOSIS — E785 Hyperlipidemia, unspecified: Secondary | ICD-10-CM | POA: Diagnosis not present

## 2018-07-14 LAB — BASIC METABOLIC PANEL
BUN: 17 mg/dL (ref 6–23)
CALCIUM: 10.5 mg/dL (ref 8.4–10.5)
CO2: 33 mEq/L — ABNORMAL HIGH (ref 19–32)
Chloride: 103 mEq/L (ref 96–112)
Creatinine, Ser: 0.96 mg/dL (ref 0.40–1.50)
GFR: 80.62 mL/min (ref >60.00)
Glucose, Bld: 101 mg/dL — ABNORMAL HIGH (ref 70–99)
Potassium: 4.6 mEq/L (ref 3.5–5.1)
Sodium: 143 mEq/L (ref 135–145)

## 2018-07-14 LAB — HEPATIC FUNCTION PANEL
ALT: 17 U/L (ref 0–53)
AST: 14 U/L (ref 0–37)
Albumin: 4.5 g/dL (ref 3.5–5.2)
Alkaline Phosphatase: 54 U/L (ref 39–117)
Bilirubin, Direct: 0.2 mg/dL (ref 0.0–0.3)
Total Bilirubin: 0.7 mg/dL (ref 0.2–1.2)
Total Protein: 6.9 g/dL (ref 6.0–8.3)

## 2018-07-14 LAB — LIPID PANEL
Cholesterol: 143 mg/dL (ref 0–200)
HDL: 63.8 mg/dL (ref >39.00)
LDL Cholesterol: 64 mg/dL (ref 0–99)
NonHDL: 78.84
Total CHOL/HDL Ratio: 2
Triglycerides: 73 mg/dL (ref 0.0–149.0)
VLDL: 14.6 mg/dL (ref 0.0–40.0)

## 2018-07-14 MED ORDER — BENAZEPRIL-HYDROCHLOROTHIAZIDE 20-12.5 MG PO TABS: 1.0000 | ORAL_TABLET | Freq: Every day | ORAL | 3 refills | Status: DC

## 2018-07-14 MED ORDER — PRAVASTATIN SODIUM 40 MG PO TABS: 40.0000 mg | ORAL_TABLET | Freq: Every day | ORAL | 3 refills | Status: AC

## 2018-07-14 MED ORDER — AMLODIPINE BESYLATE 5 MG PO TABS: 5.0000 mg | ORAL_TABLET | Freq: Every day | ORAL | 3 refills | Status: DC

## 2018-07-14 NOTE — Progress Notes (Signed)
  Subjective:     Patient ID: Charles Li, male   DOB: Jun 19, 1941, 78 y.o.   MRN: [id number]  HPI Patient seen for medical follow-up.  He has hypertension and hyperlipidemia.  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  He depends on his wife for transportation.  Doing well overall.  Medications reviewed.  Compliant with all.  Overdue for lab work.  Past Medical History:  Diagnosis Date  . Abdominal pain, epigastric 07/14/2009  . Abdominal wall hernia   . ALLERGIC RHINITIS 12/12/2006  . ASTHMA 11/28/2007  . 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 GLAUCOMA 12/12/2006  . 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 HERNIA, VENTRAL 01/04/2009  . HYPERLIPIDEMIA 11/28/2007  . HYPERTENSION 12/12/2006  . Leg swelling 04-24-12   occ., none at present  . RENAL CALCULUS 03/24/2010  . SYNCOPE 06/08/2008  . VENOUS INSUFFICIENCY 01/18/2010  . Visual disturbance 04-24-12   legally blind   Past Surgical History:  Procedure Laterality Date  . CATARACT EXTRACTION  2009 - approximate   bilat & glaucoma surgery (7 ophth surgeries)  . INGUINAL HERNIA REPAIR  05/02/2012   Procedure: HERNIA REPAIR INGUINAL ADULT BILATERAL;  Surgeon: Shann Medal, MD;  Location: [facility];  Service: General;  Laterality: N/A;  . VASECTOMY  04-24-12  . VENTRAL HERNIA REPAIR  05/02/2012   Procedure: LAPAROSCOPIC VENTRAL HERNIA;  Surgeon: Shann Medal, MD;  Location: [facility];  Service: General;  Laterality: N/A;    reports that he quit smoking about 48 years ago. His smoking use included cigarettes. He has a 21.00 pack-year smoking history. He has never used smokeless tobacco. He reports current alcohol use of about 14.0 standard drinks of alcohol per week. He reports that he does not use drugs. family history includes Birth defects in his paternal aunt; Cancer in his mother; Heart disease in his brother; Pneumonia in his father. Allergies  Allergen Reactions  . Dorzolamide Anaphylaxis and Dermatitis    Irritation on upper lids    . Acetazolamide Diarrhea    Lost weight also Lost weight also   . Bimatoprost Dermatitis and Other (See Comments)    Unknown Unknown   . Brimonidine Other (See Comments)    Unknown Unknown   . Sulfa Antibiotics   . Sulfamethoxazole Hives  . Dorzolamide Hcl-Timolol Mal Rash    itching itching   . Erythromycin Rash  . Penicillins Rash  . Sulfonamide Derivatives Rash     Review of Systems  Respiratory: Negative for cough, chest tightness and shortness of breath.   Cardiovascular: Negative for chest pain, palpitations and leg swelling.  Neurological: Negative for dizziness, syncope, weakness, light-headedness and headaches.       Objective:   Physical Exam Constitutional:      Appearance: Normal appearance.  Cardiovascular:     Rate and Rhythm: Normal rate and regular rhythm.  Pulmonary:     Effort: Pulmonary effort is normal.     Breath sounds: Normal breath sounds.  Musculoskeletal:     Right lower leg: No edema.     Left lower leg: No edema.  Neurological:     Mental Status: He is alert.        Assessment:     #1 hypertension stable and at goal  #2 hyperlipidemia    Plan:     -Refilled medications for 1 year -Check labs with lipid, hepatic, basic metabolic panel  Eulas Post MD [facility] at [facility]

## 2018-09-02 DIAGNOSIS — D225 Melanocytic nevi of trunk: Secondary | ICD-10-CM | POA: Diagnosis not present

## 2018-09-02 DIAGNOSIS — L570 Actinic keratosis: Secondary | ICD-10-CM | POA: Diagnosis not present

## 2018-09-02 DIAGNOSIS — L821 Other seborrheic keratosis: Secondary | ICD-10-CM | POA: Diagnosis not present

## 2018-09-05 ENCOUNTER — Encounter: Payer: Self-pay | Admitting: Gastroenterology

## 2018-10-09 ENCOUNTER — Telehealth: Payer: Self-pay | Admitting: *Deleted

## 2018-10-09 NOTE — Telephone Encounter (Signed)
Copied from [place] [id number]. Topic: General - Other >> Oct 09, 2018  3:49 PM Gustavus Messing wrote: Reason for CRM: The patient called and stated that he would like  a call back from Dr. Elease Hashimoto because his wife has some concerns about his healtPh. A good call back number is [phone]

## 2018-10-10 NOTE — Telephone Encounter (Signed)
Patients wife called this morning and stated that she is not feeling well and feels queasy. I am going to put a message in her chart Claiborne Rigg.

## 2018-10-10 NOTE — Telephone Encounter (Signed)
Called patient and LMOVM to return call  [place] for [facility] to Discuss results / PCP / recommendations / Schedule patient  I will try patient back in 15-30 minutes. I wanted to go over what she needs to discuss about her husband.  CRM Created in case patient calls back.

## 2019-01-01 DIAGNOSIS — H401133 Primary open-angle glaucoma, bilateral, severe stage: Secondary | ICD-10-CM | POA: Diagnosis not present

## 2019-01-29 DIAGNOSIS — H401133 Primary open-angle glaucoma, bilateral, severe stage: Secondary | ICD-10-CM | POA: Diagnosis not present

## 2019-03-30 ENCOUNTER — Ambulatory Visit (INDEPENDENT_AMBULATORY_CARE_PROVIDER_SITE_OTHER): Payer: Medicare HMO

## 2019-03-30 ENCOUNTER — Other Ambulatory Visit: Payer: Self-pay

## 2019-03-30 DIAGNOSIS — Z23 Encounter for immunization: Secondary | ICD-10-CM

## 2019-03-30 NOTE — Progress Notes (Signed)
Per orders of Dr. Elease Hashimoto, injection of High dose Fluad Quadrivalent given by Wyvonne Lenz. Patient tolerated injection well.

## 2019-04-01 DIAGNOSIS — H401133 Primary open-angle glaucoma, bilateral, severe stage: Secondary | ICD-10-CM | POA: Diagnosis not present

## 2019-04-23 ENCOUNTER — Telehealth: Payer: Self-pay | Admitting: Family Medicine

## 2019-04-23 MED ORDER — FLUOXETINE HCL 10 MG PO CAPS: 10.0000 mg | ORAL_CAPSULE | Freq: Every day | ORAL | 3 refills | Status: DC

## 2019-04-23 NOTE — Telephone Encounter (Signed)
Spoke with patient   His wife has some mild cognitive changes and starting to repeat herself more often.  He sometimes gets agitated and has low frustration tolerance.   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  He specifically wonders if a trial of low dose prozac may help with his anger/agitation. No significant depression issues.   We agreed to trial of Prozac 10 mg po qd #90 and have recommend he set up 3 week follow up to reassess.

## 2019-05-20 ENCOUNTER — Telehealth (INDEPENDENT_AMBULATORY_CARE_PROVIDER_SITE_OTHER): Payer: Medicare HMO | Admitting: Family Medicine

## 2019-05-20 DIAGNOSIS — R69 Illness, unspecified: Secondary | ICD-10-CM | POA: Diagnosis not present

## 2019-05-20 DIAGNOSIS — R451 Restlessness and agitation: Secondary | ICD-10-CM

## 2019-05-20 NOTE — Patient Instructions (Signed)
Health Maintenance Due  Topic Date Due  . PNA vac Low Risk Adult (2 of 2 - PPSV23) 03/01/2016  . COLONOSCOPY  08/06/2018    Depression screen [facility] 2/9 05/21/2017 04/15/2014 01/13/2013  Decreased Interest 0 0 0  Down, Depressed, Hopeless 0 0 0  PHQ - 2 Score 0 0 0

## 2019-05-20 NOTE — Progress Notes (Signed)
This visit type was conducted due to national recommendations for restrictions regarding the COVID-19 pandemic in an effort to limit this patient's exposure and mitigate transmission in our community.   Virtual Visit via Telephone Note  I connected with Madilyn Fireman on 05/20/19 at  1:45 PM EST by telephone and verified that I am speaking with the correct person using two identifiers.   I discussed the limitations, risks, security and privacy concerns of performing an evaluation and management service by telephone and the availability of in person appointments. I also discussed with the patient that there may be a patient responsible charge related to this service. The patient expressed understanding and agreed to proceed.  Location patient: home Location provider: work or home office Participants present for the call: patient, provider Patient did not have a visit in the prior 7 days to address this/these issue(s).   History of Present Illness: Mr. Icaza was contacted for virtual follow-up regarding recent initiation of fluoxetine.  Refer to phone note.  His wife has some early cognitive changes.  He describes more frustration than true anger.  We had started fluoxetine 10 mg daily.  At that dosage he has not seen any changes in his frustration tolerance and agitation.  He denies any depression symptoms.  No side effect from medication   Observations/Objective: Patient sounds cheerful and well on the phone. I do not appreciate any SOB. Speech and thought processing are grossly intact. Patient reported vitals:  Assessment and Plan:  Low frustration tolerance. -We recommend he consider titrating the fluoxetine to 20 mg daily and give me some feedback in 2 to 3 weeks.  If not improving at that dose discontinue fluoxetine at that point  Follow Up Instructions:  - recommend feedback in 3-4 weeks after titration of Prozac.   [id number] 5-10 [id number] 11-20 99443 21-30 I did not refer this  patient for an OV in the next 24 hours for this/these issue(s).  I discussed the assessment and treatment plan with the patient. The patient was provided an opportunity to ask questions and all were answered. The patient agreed with the plan and demonstrated an understanding of the instructions.   The patient was advised to call back or seek an in-person evaluation if the symptoms worsen or if the condition fails to improve as anticipated.  I provided 12 minutes of non-face-to-face time during this encounter.   Carolann Littler, MD

## 2019-07-01 DIAGNOSIS — H401133 Primary open-angle glaucoma, bilateral, severe stage: Secondary | ICD-10-CM | POA: Diagnosis not present

## 2019-08-03 ENCOUNTER — Other Ambulatory Visit: Payer: Self-pay

## 2019-08-04 ENCOUNTER — Ambulatory Visit (INDEPENDENT_AMBULATORY_CARE_PROVIDER_SITE_OTHER): Payer: Medicare HMO | Admitting: Family Medicine

## 2019-08-04 ENCOUNTER — Encounter: Payer: Self-pay | Admitting: Family Medicine

## 2019-08-04 ENCOUNTER — Other Ambulatory Visit: Payer: Self-pay

## 2019-08-04 VITALS — BP 120/80 | HR 50 | Temp 97.8°F | Ht 70.0 in | Wt 192.8 lb

## 2019-08-04 DIAGNOSIS — J358 Other chronic diseases of tonsils and adenoids: Secondary | ICD-10-CM | POA: Diagnosis not present

## 2019-08-04 NOTE — Progress Notes (Addendum)
Acute Office Visit  Subjective:    Patient ID: Charles Li, male    DOB: 09/18/1940, 79 y.o.   MRN: [id number]  Chief Complaint  Patient presents with  . Follow-up    wife states patient has a chemical smell to breath    HPI Patient is in today for foul-smelling breath onset 3-4 days ago according to patient's wife. He notes a new lesion to left tonsillar region that corresponds to timing of halitosis. He denies throat or tonsil pain but states occasionally he notices discomfort in that area when he swallows. Denies drainage. Neck is supple without lymphadenopathy. No supraclavicular lymphadenopathy. Denies fever, unintentional weight loss, or loss of appetite. States he has woken up with a cough during the night on 2 occasions in the last 10 days, denies sputum. Past hx of tobacco use for 25 years, quit 40 years ago.   Past Medical History:  Diagnosis Date  . Abdominal pain, epigastric 07/14/2009  . Abdominal wall hernia   . ALLERGIC RHINITIS 12/12/2006  . ASTHMA 11/28/2007  . 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 GLAUCOMA 12/12/2006  . 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 HERNIA, VENTRAL 01/04/2009  . HYPERLIPIDEMIA 11/28/2007  . HYPERTENSION 12/12/2006  . Leg swelling 04-24-12   occ., none at present  . RENAL CALCULUS 03/24/2010  . SYNCOPE 06/08/2008  . VENOUS INSUFFICIENCY 01/18/2010  . Visual disturbance 04-24-12   legally blind    Past Surgical History:  Procedure Laterality Date  . CATARACT EXTRACTION  2009 - approximate   bilat & glaucoma surgery (7 ophth surgeries)  . INGUINAL HERNIA REPAIR  05/02/2012   Procedure: HERNIA REPAIR INGUINAL ADULT BILATERAL;  Surgeon: Shann Medal, MD;  Location: [facility];  Service: General;  Laterality: N/A;  . VASECTOMY  04-24-12  . VENTRAL HERNIA REPAIR  05/02/2012   Procedure: LAPAROSCOPIC VENTRAL HERNIA;  Surgeon: Shann Medal, MD;  Location: [facility];  Service: General;  Laterality: N/A;    Family History  Problem Relation Age of Onset  .  Cancer Mother   . Pneumonia Father   . Heart disease Brother        congen heart disease  . Birth defects Paternal Aunt        lung  . Colon cancer Neg Hx   . Stomach cancer Neg Hx     Social History   Socioeconomic History  . Marital status: Married    Spouse name: Not on file  . Number of children: Not on file  . Years of education: Not on file  . Highest education level: Not on file  Occupational History  . Not on file  Tobacco Use  . Smoking status: Former Smoker    Packs/day: 1.50    Years: 14.00    Pack years: 21.00    Types: Cigarettes    Quit date: 11/06/1969    Years since quitting: 49.7  . Smokeless tobacco: Never Used  Substance and Sexual Activity  . Alcohol use: Yes    Alcohol/week: 14.0 standard drinks    Types: 14 Glasses of wine per week    Comment: wine & beer - 3 glasses daily  . Drug use: No  . Sexual activity: Yes  Other Topics Concern  . Not on file  Social History Narrative  . Not on file   Social Determinants of Health   Financial Resource Strain:   . Difficulty of Paying Living Expenses: Not on file  Food Insecurity:   . Worried About Running  Out of Food in the Last Year: Not on file  . Ran Out of Food in the Last Year: Not on file  Transportation Needs:   . Lack of Transportation (Medical): Not on file  . Lack of Transportation (Non-Medical): Not on file  Physical Activity:   . Days of Exercise per Week: Not on file  . Minutes of Exercise per Session: Not on file  Stress:   . Feeling of Stress : Not on file  Social Connections:   . Frequency of Communication with Friends and Family: Not on file  . Frequency of Social Gatherings with Friends and Family: Not on file  . Attends Religious Services: Not on file  . Active Member of Clubs or Organizations: Not on file  . Attends Archivist Meetings: Not on file  . Marital Status: Not on file  Intimate Partner Violence:   . Fear of Current or Ex-Partner: Not on file  .  Emotionally Abused: Not on file  . Physically Abused: Not on file  . Sexually Abused: Not on file    Outpatient Medications Prior to Visit  Medication Sig Dispense Refill  . albuterol (PROVENTIL HFA;VENTOLIN HFA) 108 (90 BASE) MCG/ACT inhaler Inhale 2 puffs into the lungs every 4 (four) hours as needed for wheezing. 6.7 g 0  . amLODipine (NORVASC) 5 MG tablet Take 1 tablet (5 mg total) by mouth daily. 90 tablet 3  . aspirin 81 MG tablet Take 81 mg by mouth at bedtime.    . benazepril-hydrochlorthiazide (LOTENSIN HCT) 20-12.5 MG tablet Take 1 tablet by mouth daily. 90 tablet 3  . desonide (DESOWEN) 0.05 % ointment Apply sparingly to lids as needed for dermatitis.    Marland Kitchen dorzolamide-timolol (COSOPT) 22.3-6.8 MG/ML ophthalmic solution Place 1 drop into both eyes 2 times daily.    Marland Kitchen FLUoxetine (PROZAC) 10 MG capsule Take 1 capsule (10 mg total) by mouth daily. (Patient taking differently: Take 20 mg by mouth daily. ) 90 capsule 3  . LUMIGAN 0.01 % SOLN     . Multiple Vitamin (MULTIVITAMIN) capsule Take 1 capsule by mouth daily.    . Netarsudil-Latanoprost (ROCKLATAN) 0.02-0.005 % SOLN Place 1 drop into both eyes daily.    . pravastatin (PRAVACHOL) 40 MG tablet Take 1 tablet (40 mg total) by mouth daily. Please schedule an appointment for further refills. [phone] 90 tablet 3  . TIMOPTIC OCUDOSE 0.5 % SOLN 1 drop 2 (two) times daily.      No facility-administered medications prior to visit.    Allergies  Allergen Reactions  . Dorzolamide Anaphylaxis and Dermatitis    Irritation on upper lids   . Acetazolamide Diarrhea    Lost weight also Lost weight also   . Bimatoprost Dermatitis and Other (See Comments)    Unknown Unknown   . Brimonidine Other (See Comments)    Unknown Unknown   . Sulfa Antibiotics   . Sulfamethoxazole Hives  . Dorzolamide Hcl-Timolol Mal Rash    itching itching   . Erythromycin Rash  . Penicillins Rash  . Sulfonamide Derivatives Rash     Review of  Systems   GEN: Well nourished, well developed, in no acute distress HEENT: normocephalic, atraumatic Neck: no JVD or masses Cardiac: RRR; no murmurs, rubs, or gallops, no edema  Respiratory:  clear to auscultation bilaterally, normal work of breathing GI: soft, nontender, nondistended MS: no deformity or atrophy Skin: warm and dry, no rash Neuro:  Alert and Oriented x 3, Strength and sensation are intact, follows  commands Psych: euthymic mood, full affect      Objective:    Physical Exam  Oral examination reveals no lesions, sores, or exudate from buccal mucosa Tongue is normal in appearance Mild erythema noted to posterior portion of oral cavity with small dark spot of what appears to be necrotic tissue visible on left tonsil No peritonsillar abscess noted; uvula is midline Odor is evident when patient removes his mask and opens his mouth; smell is consistent with necrotic tissue identified on patient's left tonsil Good dentition appearing appropriate for age, no gum swelling noted   BP 120/80   Pulse (!) 50   Temp 97.8 F (36.6 C) (Temporal)   Ht 5\' 10"  (1.778 m)   Wt 192 lb 12.8 oz (87.5 kg)   SpO2 97%   BMI 27.66 kg/m  Wt Readings from Last 3 Encounters:  08/04/19 192 lb 12.8 oz (87.5 kg)  07/14/18 189 lb (85.7 kg)  08/27/17 186 lb (84.4 kg)    Health Maintenance Due  Topic Date Due  . PNA vac Low Risk Adult (2 of 2 - PPSV23) 03/01/2016  . COLONOSCOPY  08/06/2018    There are no preventive care reminders to display for this patient.   Lab Results  Component Value Date   TSH 0.59 02/23/2015   Lab Results  Component Value Date   WBC 8.2 02/23/2015   HGB 14.5 02/23/2015   HCT 43.1 02/23/2015   MCV 96.8 02/23/2015   PLT 241.0 02/23/2015   Lab Results  Component Value Date   NA 143 07/14/2018   K 4.6 07/14/2018   CO2 33 (H) 07/14/2018   GLUCOSE 101 (H) 07/14/2018   BUN 17 07/14/2018   CREATININE 0.96 07/14/2018   BILITOT 0.7 07/14/2018   ALKPHOS 54  07/14/2018   AST 14 07/14/2018   ALT 17 07/14/2018   PROT 6.9 07/14/2018   ALBUMIN 4.5 07/14/2018   CALCIUM 10.5 07/14/2018   GFR 80.62 07/14/2018   Lab Results  Component Value Date   CHOL 143 07/14/2018   Lab Results  Component Value Date   HDL 63.80 07/14/2018   Lab Results  Component Value Date   LDLCALC 64 07/14/2018   Lab Results  Component Value Date   TRIG 73.0 07/14/2018   Lab Results  Component Value Date   CHOLHDL 2 07/14/2018   Lab Results  Component Value Date   HGBA1C 5.6 11/26/2006       Assessment & Plan:   Problem List Items Addressed This Visit     None      Visit Diagnoses     Tonsillar mass    -  Primary   Relevant Orders   Ambulatory referral to ENT      Area of dark necrotic-looking tissue on left tonsil that appears to be fixed and is identified by patient as the source of some discomfort with swallowing Plan to refer patient to ENT for evaluation of this tissue  No orders of the defined types were placed in this encounter.    Emmaline Life, RN   Agree with the above Referral to ENT has been placed  Eulas Post MD [facility]

## 2019-08-10 ENCOUNTER — Other Ambulatory Visit (HOSPITAL_COMMUNITY)
Admission: RE | Admit: 2019-08-10 | Discharge: 2019-08-10 | Disposition: A | Discharge status: Home / Self Care | Payer: Medicare HMO | Source: Ambulatory Visit | Attending: Otolaryngology | Admitting: Otolaryngology

## 2019-08-10 ENCOUNTER — Other Ambulatory Visit (INDEPENDENT_AMBULATORY_CARE_PROVIDER_SITE_OTHER): Payer: Self-pay

## 2019-08-10 ENCOUNTER — Encounter (INDEPENDENT_AMBULATORY_CARE_PROVIDER_SITE_OTHER): Payer: Self-pay | Admitting: Otolaryngology

## 2019-08-10 ENCOUNTER — Other Ambulatory Visit: Payer: Self-pay

## 2019-08-10 ENCOUNTER — Ambulatory Visit (INDEPENDENT_AMBULATORY_CARE_PROVIDER_SITE_OTHER): Payer: Medicare HMO | Admitting: Otolaryngology

## 2019-08-10 VITALS — Temp 97.2°F

## 2019-08-10 DIAGNOSIS — C8519 Unspecified B-cell lymphoma, extranodal and solid organ sites: Secondary | ICD-10-CM | POA: Insufficient documentation

## 2019-08-10 DIAGNOSIS — J359 Chronic disease of tonsils and adenoids, unspecified: Secondary | ICD-10-CM | POA: Diagnosis not present

## 2019-08-10 DIAGNOSIS — C140 Malignant neoplasm of pharynx, unspecified: Secondary | ICD-10-CM

## 2019-08-10 DIAGNOSIS — C099 Malignant neoplasm of tonsil, unspecified: Secondary | ICD-10-CM

## 2019-08-10 DIAGNOSIS — J029 Acute pharyngitis, unspecified: Secondary | ICD-10-CM | POA: Diagnosis not present

## 2019-08-10 NOTE — Progress Notes (Signed)
HPI: Charles Li is a 79 y.o. male who presents is referred by Dr. Elease Hashimoto for evaluation of left tonsil mass.  Patient has had some soreness on the left side of his throat for the past few weeks.  His wife has also noticed malodorous breath and he was seen by Dr. Elease Hashimoto last week who noticed an ulcer on the left tonsil and referred him here.  He has had no airway problems and good voice. He stopped smoking over 30 years ago.  Takes occasional wine and beer. 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  Past Medical History:  Diagnosis Date  . Abdominal pain, epigastric 07/14/2009  . Abdominal wall hernia   . ALLERGIC RHINITIS 12/12/2006  . ASTHMA 11/28/2007  . 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 GLAUCOMA 12/12/2006  . 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 HERNIA, VENTRAL 01/04/2009  . HYPERLIPIDEMIA 11/28/2007  . HYPERTENSION 12/12/2006  . Leg swelling 04-24-12   occ., none at present  . RENAL CALCULUS 03/24/2010  . SYNCOPE 06/08/2008  . VENOUS INSUFFICIENCY 01/18/2010  . Visual disturbance 04-24-12   legally blind   Past Surgical History:  Procedure Laterality Date  . CATARACT EXTRACTION  2009 - approximate   bilat & glaucoma surgery (7 ophth surgeries)  . INGUINAL HERNIA REPAIR  05/02/2012   Procedure: HERNIA REPAIR INGUINAL ADULT BILATERAL;  Surgeon: Shann Medal, MD;  Location: [facility];  Service: General;  Laterality: N/A;  . VASECTOMY  04-24-12  . VENTRAL HERNIA REPAIR  05/02/2012   Procedure: LAPAROSCOPIC VENTRAL HERNIA;  Surgeon: Shann Medal, MD;  Location: [facility];  Service: General;  Laterality: N/A;   Social History   Socioeconomic History  . Marital status: Married    Spouse name: Not on file  . Number of children: Not on file  . Years of education: Not on file  . Highest education level: Not on file  Occupational History  . Not on file  Tobacco Use  . Smoking status: Former Smoker    Packs/day: 1.50    Years: 14.00    Pack years: 21.00    Types: Cigarettes    Quit  date: 11/06/1969    Years since quitting: 49.7  . Smokeless tobacco: Never Used  Substance and Sexual Activity  . Alcohol use: Yes    Alcohol/week: 14.0 standard drinks    Types: 14 Glasses of wine per week    Comment: wine & beer - 3 glasses daily  . Drug use: No  . Sexual activity: Yes  Other Topics Concern  . Not on file  Social History Narrative  . Not on file   Social Determinants of Health   Financial Resource Strain:   . Difficulty of Paying Living Expenses: Not on file  Food Insecurity:   . Worried About Charity fundraiser in the Last Year: Not on file  . Ran Out of Food in the Last Year: Not on file  Transportation Needs:   . Lack of Transportation (Medical): Not on file  . Lack of Transportation (Non-Medical): Not on file  Physical Activity:   . Days of Exercise per Week: Not on file  . Minutes of Exercise per Session: Not on file  Stress:   . Feeling of Stress : Not on file  Social Connections:   . Frequency of Communication with Friends and Family: Not on file  . Frequency of Social Gatherings with Friends and Family: Not on file  . Attends Religious Services: Not on file  .  Active Member of Clubs or Organizations: Not on file  . Attends Archivist Meetings: Not on file  . Marital Status: Not on file   Family History  Problem Relation Age of Onset  . Cancer Mother   . Pneumonia Father   . Heart disease Brother        congen heart disease  . Birth defects Paternal Aunt        lung  . Colon cancer Neg Hx   . Stomach cancer Neg Hx    Allergies  Allergen Reactions  . Dorzolamide Anaphylaxis and Dermatitis    Irritation on upper lids   . Acetazolamide Diarrhea    Lost weight also Lost weight also   . Bimatoprost Dermatitis and Other (See Comments)    Unknown Unknown   . Brimonidine Other (See Comments)    Unknown Unknown   . Sulfa Antibiotics   . Sulfamethoxazole Hives  . Dorzolamide Hcl-Timolol Mal Rash    itching itching   .  Erythromycin Rash  . Penicillins Rash  . Sulfonamide Derivatives Rash   Prior to Admission medications   Medication Sig Start Date End Date Taking? Authorizing Provider  albuterol (PROVENTIL HFA;VENTOLIN HFA) 108 (90 BASE) MCG/ACT inhaler Inhale 2 puffs into the lungs every 4 (four) hours as needed for wheezing. 06/18/14  Yes Burchette, Alinda Sierras, MD  amLODipine (NORVASC) 5 MG tablet Take 1 tablet (5 mg total) by mouth daily. 07/14/18  Yes Burchette, Alinda Sierras, MD  aspirin 81 MG tablet Take 81 mg by mouth at bedtime.   Yes [provider]  benazepril-hydrochlorthiazide (LOTENSIN HCT) 20-12.5 MG tablet Take 1 tablet by mouth daily. 07/14/18  Yes Burchette, Alinda Sierras, MD  desonide (DESOWEN) 0.05 % ointment Apply sparingly to lids as needed for dermatitis. 09/16/18  Yes [provider]  dorzolamide-timolol (COSOPT) 22.3-6.8 MG/ML ophthalmic solution Place 1 drop into both eyes 2 times daily. 04/01/19 03/31/20 Yes [provider]  FLUoxetine (PROZAC) 10 MG capsule Take 1 capsule (10 mg total) by mouth daily. 04/23/19  Yes Burchette, Alinda Sierras, MD  LUMIGAN 0.01 % SOLN  07/13/13  Yes [provider]  Multiple Vitamin (MULTIVITAMIN) capsule Take 1 capsule by mouth daily.   Yes [provider]  Netarsudil-Latanoprost (ROCKLATAN) 0.02-0.005 % SOLN Place 1 drop into both eyes daily. 04/01/19  Yes [provider]  pravastatin (PRAVACHOL) 40 MG tablet Take 1 tablet (40 mg total) by mouth daily. Please schedule an appointment for further refills. [phone] 07/14/18  Yes Burchette, Alinda Sierras, MD  TIMOPTIC OCUDOSE 0.5 % SOLN 1 drop 2 (two) times daily.  01/24/12  Yes [provider]     Positive ROS: Otherwise negative  All other systems have been reviewed and were otherwise negative with the exception of those mentioned in the HPI and as above.  Physical Exam: Constitutional: Alert, well-appearing, no acute distress. Ears: External ears without lesions or  tenderness. Ear canals are clear bilaterally with intact, clear TMs.  Nasal: External nose without lesions. Septum with minimal deformity.. Clear nasal passages Oral: Lips and gums without lesions. Tongue and palate mucosa without lesions.  Patient has a large ulcer involving the left tonsil that starts at the superior edge of the tonsil and extends down toward the base of tongue. Fiberoptic laryngoscopy was performed through the right nostril.  The nasopharynx was clear.  Palate area was uninvolved.  He has an ulcer of the left tonsil that extends down onto the base of tongue but does not  cross midline.  The vallecular area was clear the epiglottis was normal.  Vocal cords were clear bilaterally with normal vocal cord mobility.  Both piriform sinuses were clear. Neck: Patient has a slightly enlarged left upper jugular digastric node that is easily palpable just below the angle of the jaw on the left side.  No obvious lower adenopathy no posterior adenopathy and no supraclavicular adenopathy. Respiratory: Breathing comfortably  Skin: No facial/neck lesions or rash noted.  Laryngoscopy  Date/Time: 08/10/2019 4:36 PM Performed by: Rozetta Nunnery, MD Authorized by: Rozetta Nunnery, MD   Consent:    Consent obtained:  Verbal   Consent given by:  Patient   Risks discussed:  Bleeding and pain Procedure details:    Indications: direct visualization of the upper aerodigestive tract     Medication:  Afrin   Instrument: flexible fiberoptic laryngoscope     Scope location: right nare   Mouth:    Vallecula: normal     Epiglottis: normal   Throat:    Pyriform sinus: normal     True vocal cords: normal   Comments:     Patient with a left tonsil mass that extends down toward the base of tongue.  Vallecula and epiglottis were uninvolved.  Mass extends up toward the superior pole of the tonsil but does not involve the soft palate. 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  We will go ahead and schedule a CT scan of the neck and a PET scan. I think he would be a candidate for robotic surgery and have discussed the patient with Dr. Conley Canal at [facility] will be contacting him later this week. Patient will call us back here in 3 days concerning results of the biopsy.   Radene Journey, MD   CC:

## 2019-08-11 NOTE — Addendum Note (Signed)
Addended by: Blaine Hamper L on: 08/11/2019 12:01 PM   Modules accepted: Orders

## 2019-08-12 ENCOUNTER — Telehealth (INDEPENDENT_AMBULATORY_CARE_PROVIDER_SITE_OTHER): Payer: Self-pay

## 2019-08-14 ENCOUNTER — Telehealth: Payer: Self-pay | Admitting: Hematology and Oncology

## 2019-08-14 NOTE — Telephone Encounter (Signed)
I discussed with the patient concerning results of his biopsy pathology.  I initially thought this would be a squamous cell carcinoma.  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  Patient has subsequently been referred to [facility] oncology who he will see next week.

## 2019-08-14 NOTE — Telephone Encounter (Signed)
Receive a new pt referral from Dr. Lucia Gaskins for b-cell lymphoma. I cld and spoke to Charles Li and scheduled him to see Dr. Lorenso Courier on 2/8 at 10am. Pt aware to arrive 15 minutes early.

## 2019-08-16 NOTE — Progress Notes (Signed)
[facility] [phone]   Fax:([phone] [place] NOTE  Patient Care Team: Eulas Post, MD as PCP - General Ladene Artist, MD as Consulting Physician (Gastroenterology)  Hematological/Oncological History # B Cell Lymphoma, Newly Diagnosed 1) 08/10/2019: referred to Dr. Lucia Gaskins for tonsillar mass present for a few weeks. 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 FISH studies pending.  2) 08/17/2019: establish care with Dr. Lorenso Courier    CHIEF COMPLAINTS/PURPOSE OF CONSULTATION:  Newly diagnosed B cell lymphoma, staging and final diagnosis underway  HISTORY OF PRESENTING ILLNESS:  Charles Li 79 y.o. male with medical history significant for HTN, HLD, tobacco use who presents for evaluation of a newly diagnosed left tonsillar B cell lymphoma.   On review of the previous records Charles Li was referred to Dr. Lucia Gaskins in ENT for a large tonsillar mass that was present for several weeks.  During the office visit on 08/10/2019 the patient underwent a biopsy of the mass which confirmed a large B-cell lymphoma.  For further evaluation management the patient was referred to hematology oncology.  On exam today Charles Li notes that he feels well.  He reports that he first noticed the problem a couple weeks ago when his wife was complaining about his bad breath.  He is wife reports that there was a horrible smell coming from his mouth that had a chemical-like odor to it.  When the wife continued to complain and requested that he see a dentist the patient had her view in the back of his mouth and saw the lesion in the left tonsillar region.  The patient was subsequently seen by ENT who performed the biopsy.  On further discussion he notes that his weight has been stable and he has been otherwise asymptomatic from this lesion.  He denies having any fevers, chills, sweats, nausea, vomiting or diarrhea.  He does have some tenderness in the back of his  mouth and notes that it today is a 3 out of 10 in severity and its worst it is a 7 out of 10 in severity.  He has been taking ibuprofen to try to take the edge off the pain and is requesting stronger pain medication today.  He notes that this has not prevented him from being able to swallow or eat, though it is uncomfortable.  He reports that his bowel movements are regular and that he has had no other concerning GI symptoms and denies having any bleeding.  He reports that he quit smoking 45 years ago and smoked for approximately 20 years.  He notes that he is an active consumer of alcohol and drinks a couple drinks mostly beer and wine daily.  His prior occupation was working in a Civil engineer, contracting.  A full 10 point ROS is listed below.  MEDICAL HISTORY:  Past Medical History:  Diagnosis Date  . Abdominal pain, epigastric 07/14/2009  . Abdominal wall hernia   . ALLERGIC RHINITIS 12/12/2006  . ASTHMA 11/28/2007  . 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 GLAUCOMA 12/12/2006  . 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 HERNIA, VENTRAL 01/04/2009  . HYPERLIPIDEMIA 11/28/2007  . HYPERTENSION 12/12/2006  . Leg swelling 04-24-12   occ., none at present  . RENAL CALCULUS 03/24/2010  . SYNCOPE 06/08/2008  . VENOUS INSUFFICIENCY 01/18/2010  . 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 CATARACT EXTRACTION  2009 - approximate   bilat &  glaucoma surgery (7 ophth surgeries)  . INGUINAL HERNIA REPAIR  05/02/2012   Procedure: HERNIA REPAIR INGUINAL ADULT BILATERAL;  Surgeon: Shann Medal, MD;  Location: [facility];  Service: General;  Laterality: N/A;  . VASECTOMY  04-24-12  . VENTRAL HERNIA REPAIR  05/02/2012   Procedure: LAPAROSCOPIC VENTRAL HERNIA;  Surgeon: Shann Medal, MD;  Location: [facility];  Service: General;  Laterality: N/A;    SOCIAL HISTORY: Social History   Socioeconomic History  . Marital status: Married    Spouse name: Not on file  .  Number of children: Not on file  . Years of education: Not on file  . Highest education level: Not on file  Occupational History  . Not on file  Tobacco Use  . Smoking status: Former Smoker    Packs/day: 1.50    Years: 14.00    Pack years: 21.00    Types: Cigarettes    Quit date: 11/06/1969    Years since quitting: 49.8  . Smokeless tobacco: Never Used  Substance and Sexual Activity  . Alcohol use: Yes    Alcohol/week: 14.0 standard drinks    Types: 14 Glasses of wine per week    Comment: wine & beer - 3 glasses daily  . Drug use: No  . Sexual activity: Yes  Other Topics Concern  . Not on file  Social History Narrative  . Not on file   Social Determinants of Health   Financial Resource Strain:   . Difficulty of Paying Living Expenses: Not on file  Food Insecurity:   . Worried About Charity fundraiser in the Last Year: Not on file  . Ran Out of Food in the Last Year: Not on file  Transportation Needs:   . Lack of Transportation (Medical): Not on file  . Lack of Transportation (Non-Medical): Not on file  Physical Activity:   . Days of Exercise per Week: Not on file  . Minutes of Exercise per Session: Not on file  Stress:   . Feeling of Stress : Not on file  Social Connections:   . Frequency of Communication with Friends and Family: Not on file  . Frequency of Social Gatherings with Friends and Family: Not on file  . Attends Religious Services: Not on file  . Active Member of Clubs or Organizations: Not on file  . Attends Archivist Meetings: Not on file  . Marital Status: Not on file  Intimate Partner Violence:   . Fear of Current or Ex-Partner: Not on file  . Emotionally Abused: Not on file  . Physically Abused: Not on file  . Sexually Abused: Not on file    FAMILY HISTORY: Family History  Problem Relation Age of Onset  . Cancer Mother   . Pneumonia Father   . Heart disease Brother        congen heart disease  . Birth defects Paternal Aunt         lung  . Colon cancer Neg Hx   . Stomach cancer Neg Hx     ALLERGIES:  is allergic to dorzolamide; acetazolamide; bimatoprost; brimonidine; sulfa antibiotics; sulfamethoxazole; dorzolamide hcl-timolol mal; erythromycin; penicillins; and sulfonamide derivatives.  MEDICATIONS:  Current Outpatient Medications  Medication Sig Dispense Refill  . amLODipine (NORVASC) 5 MG tablet Take 1 tablet (5 mg total) by mouth daily. 90 tablet 3  . aspirin 81 MG tablet Take 81 mg by mouth at bedtime.    . benazepril-hydrochlorthiazide (LOTENSIN HCT) 20-12.5 MG tablet Take 1  tablet by mouth daily. 90 tablet 3  . desonide (DESOWEN) 0.05 % ointment Apply sparingly to lids as needed for dermatitis.    Marland Kitchen dorzolamide-timolol (COSOPT) 22.3-6.8 MG/ML ophthalmic solution Place 1 drop into both eyes 2 times daily.    Marland Kitchen FLUoxetine (PROZAC) 10 MG capsule Take 1 capsule (10 mg total) by mouth daily. 90 capsule 3  . ibuprofen (ADVIL) 200 MG tablet Take 200 mg by mouth every 6 (six) hours as needed.    Marland Kitchen LUMIGAN 0.01 % SOLN     . Multiple Vitamin (MULTIVITAMIN) capsule Take 1 capsule by mouth daily.    . Netarsudil-Latanoprost (ROCKLATAN) 0.02-0.005 % SOLN Place 1 drop into both eyes daily.    . pravastatin (PRAVACHOL) 40 MG tablet Take 1 tablet (40 mg total) by mouth daily. Please schedule an appointment for further refills. [phone] 90 tablet 3  . TIMOPTIC OCUDOSE 0.5 % SOLN 1 drop 2 (two) times daily.     Marland Kitchen lidocaine (XYLOCAINE) 2 % solution Use as directed 15 mLs in the mouth or throat every 6 (six) hours as needed for mouth pain (swish in back of mouth for pain relief. Do not swallow.). 200 mL 2  . LORazepam (ATIVAN) 1 MG tablet Take 1 tablet (1 mg total) by mouth as directed. Take 30 minutes to 1 hour prior to PET CT scan. 1 tablet 0  . oxyCODONE (OXY IR/ROXICODONE) 5 MG immediate release tablet Take 1 tablet (5 mg total) by mouth every 6 (six) hours as needed for severe pain or breakthrough pain. 30 tablet 0  .  senna-docusate (SENOKOT-S) 8.6-50 MG tablet Take 1 tablet by mouth at bedtime. 30 tablet 3   No current facility-administered medications for this visit.    REVIEW OF SYSTEMS:   Constitutional: ( - ) fevers, ( - )  chills , ( - ) night sweats Eyes: ( - ) blurriness of vision, ( - ) double vision, ( - ) watery eyes Ears, nose, mouth, throat, and face: ( - ) mucositis, ( + ) sore throat Respiratory: ( - ) cough, ( - ) dyspnea, ( - ) wheezes Cardiovascular: ( - ) palpitation, ( - ) chest discomfort, ( - ) lower extremity swelling Gastrointestinal:  ( - ) nausea, ( - ) heartburn, ( - ) change in bowel habits Skin: ( - ) abnormal skin rashes Lymphatics: ( + ) new lymphadenopathy (post auricular), ( - ) easy bruising Neurological: ( - ) numbness, ( - ) tingling, ( - ) new weaknesses Behavioral/Psych: ( - ) mood change, ( - ) new changes  All other systems were reviewed with the patient and are negative.  PHYSICAL EXAMINATION: ECOG PERFORMANCE STATUS: 1 - Symptomatic but completely ambulatory  Vitals:   08/17/19 1007  BP: 139/76  Pulse: (!) 48  Resp: 18  Temp: 98 F (36.7 C)  SpO2: 100%   Filed Weights   08/17/19 1007  Weight: 190 lb (86.2 kg)    GENERAL: well appearing elderly Caucasian male in NAD  SKIN: skin color, texture, turgor are normal, no rashes or significant lesions EYES: conjunctiva are pink and non-injected, sclera clear OROPHARYNX: lesion in the left posterior of the mouth with erythema and whitish exudate LYMPH:  Small post auricular lymph node, otherwise no palpable lymphadenopathy in the cervical, axillary or supraclavicular region.  LUNGS: clear to auscultation and percussion with normal breathing effort HEART: regular rate & rhythm and no murmurs and no lower extremity edema Musculoskeletal: no cyanosis of digits and no  clubbing  PSYCH: alert & oriented x 3, fluent speech NEURO: no focal motor/sensory deficits  LABORATORY DATA:  I have reviewed the data as  listed CBC Latest Ref Rng & Units 02/23/2015 04/24/2012 11/07/2010  WBC 4.0 - 10.5 K/uL 8.2 7.2 6.2  Hemoglobin 13.0 - 17.0 g/dL 14.5 14.9 14.1  Hematocrit 39.0 - 52.0 % 43.1 42.0 41.1  Platelets 150.0 - 400.0 K/uL 241.0 228 207.0    CMP Latest Ref Rng & Units 07/14/2018 05/21/2017 02/23/2015  Glucose 70 - 99 mg/dL 101(H) 110(H) 112(H)  BUN 6 - 23 mg/dL 17 18 18   Creatinine 0.40 - 1.50 mg/dL 0.96 0.86 0.76  Sodium 135 - 145 mEq/L 143 140 142  Potassium 3.5 - 5.1 mEq/L 4.6 3.6 4.4  Chloride 96 - 112 mEq/L 103 101 104  CO2 19 - 32 mEq/L 33(H) 33(H) 33(H)  Calcium 8.4 - 10.5 mg/dL 10.5 9.4 9.5  Total Protein 6.0 - 8.3 g/dL 6.9 6.5 6.1  Total Bilirubin 0.2 - 1.2 mg/dL 0.7 0.9 1.0  Alkaline Phos 39 - 117 U/L 54 45 42  AST 0 - 37 U/L 14 15 15   ALT 0 - 53 U/L 17 15 13      PATHOLOGY:  SURGICAL PATHOLOGY  CASE: [id number]  PATIENT: Tramain Domeier  Surgical Pathology Report   Clinical History: tonsil cancer (cm)   FINAL MICROSCOPIC DIAGNOSIS:   A. TONSIL, LEFT, BIOPSY:  - Large B-cell lymphoma  - See comment   COMMENT:  The tonsillar tissue has an ulcerated surface with underlying atypical  cellular proliferation. By immunohistochemistry, the neoplastic cells  are positive for CD20, CD10, BCL6 and BCL2 (weak) but negative for CD3,  CD56, CD5, Mum-1, cytokeratin AE1/3 and EBV by in situ hybridization.  The proliferative rate by Ki-67 is 70 to 80%. Overall, the features are  consistent with a large B-cell lymphoma. The differential diagnosis  includes diffuse large B-cell lymphoma and high-grade B-cell lymphoma  (double hit). FISH is pending and will be reported in an addendum.   Preliminary results of this case were given to Dr. Lucia Gaskins on August 14, 2019.   Dr. Tresa Moore reviewed the case and agrees with the above diagnosis.   GROSS DESCRIPTION:   The specimen is received in formalin and consists of a 0.9 x 0.4 x 0.4  cm piece of tan soft tissue. The specimen is  entirely submitted in 1  cassette. Craig Staggers 08/14/2019)   Final Diagnosis performed by Thressa Sheller, MD.  Electronically signed  08/14/2019  Technical and / or Professional components performed at [facility]. [facility], [place] [address] [place], [place] [address].  Immunohistochemistry Technical component (if applicable) was performed  at [facility]. [address], [place],  [place], [place] [address].  IMMUNOHISTOCHEMISTRY DISCLAIMER (if applicable):  Some of these immunohistochemical stains may have been developed and the  performance characteristics determine by [facility]. Some  may not have been cleared or approved by the U.S. Food and Drug  Administration. The FDA has determined that such clearance or approval  is not necessary. This test is used for clinical purposes. It should not  be regarded as investigational or for research. This laboratory is  certified under the [place]  (CLIA-88) as qualified to perform high complexity clinical laboratory  testing. The controls stained appropriately.  BLOOD FILM:  Review of the peripheral blood smear showed normal appearing white cells with neutrophils that were appropriately lobated and granulated. There  was no predominance of bi-lobed or hyper-segmented neutrophils appreciated. No Dohle bodies were noted. There was no left shifting, immature forms or blasts noted. Lymphocytes remain normal in size without any predominance of large granular lymphocytes. Increased atypical/reactive lymphocytes noted. Red cells show no anisopoikilocytosis, macrocytes , microcytes or polychromasia. There were no schistocytes, target cells, echinocytes, acanthocytes, dacrocytes, or stomatocytes.There was no rouleaux formation, nucleated red cells, or intra-cellular inclusions noted. The platelets are normal in size, shape, and color without any clumping evident.  RADIOGRAPHIC  STUDIES: None yet to review.  No results found.  ASSESSMENT & PLAN YAHSHUA THIBAULT 79 y.o. male with medical history significant for HTN, HLD, tobacco use who presents for evaluation of a newly diagnosed left tonsillar B cell lymphoma.  On review of the pathology and discussion with the patient his findings are most consistent with a B-cell lymphoma.  At this time we are uncertain of the extent of the disease or the genetic make-up.  The tumor tissue is currently undergoing evaluation to determine if it is a double hit lymphoma.  Additionally a PET CT scan was ordered by ENT, however unfortunately this was not performed and we will reorder this today.  On our visit today we had a generalized discussion about the plan moving forward including imaging, port placement, basic labs, and the likely need for chemotherapy in the near future.  The patient is wife voiced their understanding and had opportunities to ask questions about treatment moving forward.  We will have a more detailed plan once the PET scan is performed and the final pathology is back.  In the interim we will tentatively plan for chemotherapy given the patient's good functional status.  We will plan to have the patient return in 2 weeks time or sooner if our work-up below is completed in timely fashion.  #Newly Diagnosed B Cell Lymphoma, Staging/Diagnostic Evaluation Underway --today will order CBC, CMP, LDH, uric acid and peripheral blood film --additionally will order Hep B, Hep C, and HIV -- assure that PET scan is scheduled for staging of the cancer --recommend TTE in preparation for possible treatment with anthracycline therapy  --await results of the BCL2, BCL6, and MYC rearrangements on the tumor (double hit, triple hit determination) --RTC in 2 weeks or sooner if the above workup is completed.   #Oropharynx Pain --continue taking ibuprofen PRN --will prescribe oxycodone 5-31m q6H PRN for throat pain --additionally will  prescribe viscous lidocaine --provide patient with senna-docusate to assure these medications do not cause him constipation. --continue to monitor  Orders Placed This Encounter  Procedures  . NM PET Image Initial (PI) Skull Base To Thigh    Standing Status:   Future    Standing Expiration Date:   08/16/2020    Order Specific Question:   If indicated for the ordered procedure, I authorize the administration of a radiopharmaceutical per Radiology protocol    Answer:   Yes    Order Specific Question:   Preferred imaging location?    Answer:   [facility]   Order Specific Question:   Release to patient    Answer:   Immediate    Order Specific Question:   Radiology Contrast Protocol - do NOT remove file path    Answer:   [email or web address]  . CBC with Differential ([facility])    Standing Status:   Future    Number of Occurrences:   1    Standing Expiration Date:   08/16/2020  . Sedimentation rate  Standing Status:   Future    Number of Occurrences:   1    Standing Expiration Date:   08/16/2020  . CMP ([place] only)    Standing Status:   Future    Number of Occurrences:   1    Standing Expiration Date:   08/16/2020  . Lactate dehydrogenase (LDH)    Standing Status:   Future    Number of Occurrences:   1    Standing Expiration Date:   08/16/2020  . Save Smear (SSMR)    Standing Status:   Future    Number of Occurrences:   1    Standing Expiration Date:   08/16/2020  . Uric acid    Standing Status:   Future    Number of Occurrences:   1    Standing Expiration Date:   08/16/2020  . Hepatitis B surface antibody    Standing Status:   Future    Number of Occurrences:   1    Standing Expiration Date:   08/16/2020  . Hepatitis B surface antigen    Standing Status:   Future    Number of Occurrences:   1    Standing Expiration Date:   08/16/2020  . Hepatitis B core antibody, total    Standing Status:   Future    Number of Occurrences:   1    Standing  Expiration Date:   08/16/2020  . HIV antibody (with reflex)    Standing Status:   Future    Number of Occurrences:   1    Standing Expiration Date:   08/16/2020  . ECHOCARDIOGRAM COMPLETE BUBBLE STUDY    Standing Status:   Future    Standing Expiration Date:   11/13/2020    Order Specific Question:   Where should this test be performed    Answer:   [place]    Order Specific Question:   Perflutren DEFINITY (image enhancing agent) should be administered unless hypersensitivity or allergy exist    Answer:   Administer Perflutren    Order Specific Question:   Reason for exam    Answer:   Other-Full Diagnosis List    Order Specific Question:   Full ICD-10/Reason for Exam    Answer:   Adverse effect of chemotherapy [[id number]]    Order Specific Question:   Release to patient    Answer:   Immediate    All questions were answered. The patient knows to call the clinic with any problems, questions or concerns.  A total of more than 60 minutes were spent on this encounter and over half of that time was spent on counseling and coordination of care as outlined above.   Ledell Peoples, MD Department of Hematology/Oncology [place] at [facility] Phone: [phone] Pager: [phone] Email: [email or web address]  08/17/2019 11:50 AM

## 2019-08-17 ENCOUNTER — Encounter: Payer: Self-pay | Admitting: Hematology and Oncology

## 2019-08-17 ENCOUNTER — Inpatient Hospital Stay: Payer: Medicare HMO | Attending: Hematology and Oncology | Admitting: Hematology and Oncology

## 2019-08-17 ENCOUNTER — Other Ambulatory Visit: Payer: Self-pay

## 2019-08-17 ENCOUNTER — Inpatient Hospital Stay: Payer: Medicare HMO

## 2019-08-17 ENCOUNTER — Other Ambulatory Visit: Payer: Self-pay | Admitting: *Deleted

## 2019-08-17 VITALS — BP 139/76 | HR 48 | Temp 98.0°F | Resp 18 | Ht 70.0 in | Wt 190.0 lb

## 2019-08-17 DIAGNOSIS — Z7289 Other problems related to lifestyle: Secondary | ICD-10-CM | POA: Diagnosis not present

## 2019-08-17 DIAGNOSIS — E785 Hyperlipidemia, unspecified: Secondary | ICD-10-CM | POA: Diagnosis not present

## 2019-08-17 DIAGNOSIS — I10 Essential (primary) hypertension: Secondary | ICD-10-CM | POA: Diagnosis not present

## 2019-08-17 DIAGNOSIS — K1379 Other lesions of oral mucosa: Secondary | ICD-10-CM | POA: Diagnosis not present

## 2019-08-17 DIAGNOSIS — Z87891 Personal history of nicotine dependence: Secondary | ICD-10-CM | POA: Diagnosis not present

## 2019-08-17 DIAGNOSIS — Z79899 Other long term (current) drug therapy: Secondary | ICD-10-CM | POA: Insufficient documentation

## 2019-08-17 DIAGNOSIS — C8331 Diffuse large B-cell lymphoma, lymph nodes of head, face, and neck: Secondary | ICD-10-CM

## 2019-08-17 DIAGNOSIS — Z808 Family history of malignant neoplasm of other organs or systems: Secondary | ICD-10-CM | POA: Diagnosis not present

## 2019-08-17 LAB — CBC WITH DIFFERENTIAL (CANCER CENTER ONLY)
Abs Immature Granulocytes: 0.02 10*3/uL (ref 0.00–0.07)
Basophils Absolute: 0.1 10*3/uL (ref 0.0–0.1)
Basophils Relative: 1 %
Eosinophils Absolute: 0.3 10*3/uL (ref 0.0–0.5)
Eosinophils Relative: 3 %
HCT: 44.1 % (ref 39.0–52.0)
Hemoglobin: 15.1 g/dL (ref 13.0–17.0)
Immature Granulocytes: 0 %
Lymphocytes Relative: 19 %
Lymphs Abs: 1.9 10*3/uL (ref 0.7–4.0)
MCH: 32.9 pg (ref 26.0–34.0)
MCHC: 34.2 g/dL (ref 30.0–36.0)
MCV: 96.1 fL (ref 80.0–100.0)
Monocytes Absolute: 0.7 10*3/uL (ref 0.1–1.0)
Monocytes Relative: 7 %
Neutro Abs: 7.1 10*3/uL (ref 1.7–7.7)
Neutrophils Relative %: 70 %
Platelet Count: 251 10*3/uL (ref 150–400)
RBC: 4.59 MIL/uL (ref 4.22–5.81)
RDW: 12.4 % (ref 11.5–15.5)
WBC Count: 10 10*3/uL (ref 4.0–10.5)
nRBC: 0 % (ref 0.0–0.2)

## 2019-08-17 LAB — HEPATITIS B SURFACE ANTIGEN: Hepatitis B Surface Ag: NONREACTIVE

## 2019-08-17 LAB — URIC ACID: Uric Acid, Serum: 4.7 mg/dL (ref 3.7–8.6)

## 2019-08-17 LAB — CMP (CANCER CENTER ONLY)
ALT: 12 U/L (ref 0–44)
AST: 12 U/L — ABNORMAL LOW (ref 15–41)
Albumin: 4 g/dL (ref 3.5–5.0)
Alkaline Phosphatase: 59 U/L (ref 38–126)
Anion gap: 8 (ref 5–15)
BUN: 17 mg/dL (ref 8–23)
CO2: 30 mmol/L (ref 22–32)
Calcium: 9.4 mg/dL (ref 8.9–10.3)
Chloride: 104 mmol/L (ref 98–111)
Creatinine: 0.98 mg/dL (ref 0.61–1.24)
GFR, Est AFR Am: >60 mL/min (ref >60)
GFR, Estimated: >60 mL/min (ref >60)
Glucose, Bld: 100 mg/dL — ABNORMAL HIGH (ref 70–99)
Potassium: 3.6 mmol/L (ref 3.5–5.1)
Sodium: 142 mmol/L (ref 135–145)
Total Bilirubin: 0.7 mg/dL (ref 0.3–1.2)
Total Protein: 7 g/dL (ref 6.5–8.1)

## 2019-08-17 LAB — HEPATITIS B CORE ANTIBODY, TOTAL: Hep B Core Total Ab: NONREACTIVE

## 2019-08-17 LAB — SEDIMENTATION RATE: Sed Rate: 5 mm/hr (ref 0–16)

## 2019-08-17 LAB — LACTATE DEHYDROGENASE: LDH: 148 U/L (ref 98–192)

## 2019-08-17 LAB — HEPATITIS B SURFACE ANTIBODY,QUALITATIVE: Hep B S Ab: NONREACTIVE

## 2019-08-17 LAB — HIV ANTIBODY (ROUTINE TESTING W REFLEX): HIV Screen 4th Generation wRfx: NONREACTIVE

## 2019-08-17 LAB — SAVE SMEAR(SSMR), FOR PROVIDER SLIDE REVIEW

## 2019-08-17 MED ORDER — SENNOSIDES-DOCUSATE SODIUM 8.6-50 MG PO TABS: 1.0000 | ORAL_TABLET | Freq: Every day | ORAL | 3 refills | Status: DC

## 2019-08-17 MED ORDER — LORAZEPAM 1 MG PO TABS: 1.0000 mg | ORAL_TABLET | ORAL | 0 refills | Status: DC

## 2019-08-17 MED ORDER — OXYCODONE HCL 5 MG PO TABS: 5.0000 mg | ORAL_TABLET | Freq: Four times a day (QID) | ORAL | 0 refills | Status: DC | PRN

## 2019-08-17 MED ORDER — LIDOCAINE VISCOUS HCL 2 % MT SOLN: 15.0000 mL | Freq: Four times a day (QID) | OROMUCOSAL | 2 refills | Status: DC | PRN

## 2019-08-18 ENCOUNTER — Telehealth: Payer: Self-pay | Admitting: Hematology and Oncology

## 2019-08-18 NOTE — Telephone Encounter (Signed)
Scheduled per los. Called and left msg. Mailed printout  °

## 2019-08-21 ENCOUNTER — Encounter (HOSPITAL_COMMUNITY): Payer: Medicare HMO

## 2019-08-21 ENCOUNTER — Other Ambulatory Visit (HOSPITAL_COMMUNITY): Payer: Medicare HMO

## 2019-08-28 ENCOUNTER — Ambulatory Visit (HOSPITAL_COMMUNITY)
Admission: RE | Admit: 2019-08-28 | Discharge: 2019-08-28 | Disposition: A | Discharge status: Home / Self Care | Payer: Medicare HMO | Source: Ambulatory Visit | Attending: Hematology and Oncology | Admitting: Hematology and Oncology

## 2019-08-28 ENCOUNTER — Other Ambulatory Visit: Payer: Self-pay

## 2019-08-28 DIAGNOSIS — Z79899 Other long term (current) drug therapy: Secondary | ICD-10-CM | POA: Diagnosis not present

## 2019-08-28 DIAGNOSIS — C8330 Diffuse large B-cell lymphoma, unspecified site: Secondary | ICD-10-CM | POA: Diagnosis not present

## 2019-08-28 DIAGNOSIS — C8331 Diffuse large B-cell lymphoma, lymph nodes of head, face, and neck: Secondary | ICD-10-CM | POA: Diagnosis present

## 2019-08-28 DIAGNOSIS — N132 Hydronephrosis with renal and ureteral calculous obstruction: Secondary | ICD-10-CM | POA: Insufficient documentation

## 2019-08-28 LAB — GLUCOSE, CAPILLARY: Glucose-Capillary: 117 mg/dL — ABNORMAL HIGH (ref 70–99)

## 2019-08-28 MED ORDER — FLUDEOXYGLUCOSE F - 18 (FDG) INJECTION
10.0000 | Freq: Once | INTRAVENOUS | Status: AC
  Administered 2019-08-28: 10 via INTRAVENOUS

## 2019-09-01 ENCOUNTER — Encounter (HOSPITAL_COMMUNITY): Payer: Self-pay | Admitting: Otolaryngology

## 2019-09-02 ENCOUNTER — Other Ambulatory Visit: Payer: Self-pay | Admitting: *Deleted

## 2019-09-02 ENCOUNTER — Other Ambulatory Visit: Payer: Self-pay

## 2019-09-02 ENCOUNTER — Inpatient Hospital Stay (HOSPITAL_BASED_OUTPATIENT_CLINIC_OR_DEPARTMENT_OTHER): Payer: Medicare HMO | Admitting: Hematology and Oncology

## 2019-09-02 ENCOUNTER — Encounter: Payer: Self-pay | Admitting: Hematology and Oncology

## 2019-09-02 ENCOUNTER — Inpatient Hospital Stay: Payer: Medicare HMO

## 2019-09-02 VITALS — BP 126/61 | HR 59 | Temp 98.2°F | Resp 18 | Ht 70.0 in | Wt 190.1 lb

## 2019-09-02 DIAGNOSIS — C8331 Diffuse large B-cell lymphoma, lymph nodes of head, face, and neck: Secondary | ICD-10-CM

## 2019-09-02 NOTE — Progress Notes (Addendum)
START ON PATHWAY REGIMEN - Lymphoma and CLL     A cycle is every 21 days:     Prednisone      Rituximab-xxxx      Cyclophosphamide      Doxorubicin      Vincristine   **Always confirm dose/schedule in your pharmacy ordering system**  Patient Characteristics: Diffuse Large B-Cell Lymphoma or Follicular Lymphoma, Grade 3B, First Line, Stage I and II, No Bulk Disease Type: Not Applicable Disease Type: Diffuse Large B-Cell Lymphoma Disease Type: Not Applicable Line of therapy: First Line [place] Stage: I Disease Characteristics: No Bulk Intent of Therapy: Curative Intent, Discussed with Patient
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
## 2019-09-03 ENCOUNTER — Other Ambulatory Visit: Payer: Self-pay

## 2019-09-03 ENCOUNTER — Telehealth: Payer: Self-pay | Admitting: *Deleted

## 2019-09-03 ENCOUNTER — Telehealth: Payer: Self-pay | Admitting: Adult Health

## 2019-09-03 DIAGNOSIS — C8331 Diffuse large B-cell lymphoma, lymph nodes of head, face, and neck: Secondary | ICD-10-CM

## 2019-09-03 LAB — SURGICAL PATHOLOGY

## 2019-09-03 NOTE — Telephone Encounter (Signed)
error 

## 2019-09-03 NOTE — Telephone Encounter (Signed)
Scheduled apt per 2/25 sch message - pt aware of appt date and time

## 2019-09-03 NOTE — Telephone Encounter (Signed)
-----  Message from Gardenia Phlegm, NP sent at 09/03/2019  7:51 AM EST ----- Cathleen Fears,   Please see if patient wants to come tomorrow where the other patient was since they want to reschedule.    Thanks ----- Message ----- From: Orson Slick, MD Sent: 09/02/2019   4:55 PM EST To: Gardenia Phlegm, NP  Alfredo Bach,  I have a patient in my clinic who is currently undergoing workup for DLBCL and is need of a bone marrow biopsy. We are hoping to have the marrow early next week as we are trying to move quickily to get therapy started. Is there any way you can schedule him Monday/Tuesday of next week? If not we can try to have IR do it urgently.  Thanks!  Ulice Dash

## 2019-09-03 NOTE — Telephone Encounter (Signed)
Per Wilber Bihari, NP, called pt to schedule bone marrow biopsy for 2/26. Pt is aware of time and date. Message sent to scheduling for lab and [facility] was called.

## 2019-09-04 ENCOUNTER — Other Ambulatory Visit: Payer: Self-pay

## 2019-09-04 ENCOUNTER — Inpatient Hospital Stay: Payer: Medicare HMO

## 2019-09-04 ENCOUNTER — Other Ambulatory Visit: Payer: Self-pay | Admitting: Radiology

## 2019-09-04 ENCOUNTER — Inpatient Hospital Stay (HOSPITAL_BASED_OUTPATIENT_CLINIC_OR_DEPARTMENT_OTHER): Payer: Medicare HMO | Admitting: Adult Health

## 2019-09-04 ENCOUNTER — Ambulatory Visit (HOSPITAL_COMMUNITY)
Admission: RE | Admit: 2019-09-04 | Discharge: 2019-09-04 | Disposition: A | Discharge status: Home / Self Care | Payer: Medicare HMO | Source: Ambulatory Visit | Attending: Hematology and Oncology | Admitting: Hematology and Oncology

## 2019-09-04 VITALS — BP 121/85 | HR 59 | Temp 98.0°F | Resp 18

## 2019-09-04 DIAGNOSIS — Z01818 Encounter for other preprocedural examination: Secondary | ICD-10-CM | POA: Insufficient documentation

## 2019-09-04 DIAGNOSIS — C8590 Non-Hodgkin lymphoma, unspecified, unspecified site: Secondary | ICD-10-CM | POA: Diagnosis not present

## 2019-09-04 DIAGNOSIS — E785 Hyperlipidemia, unspecified: Secondary | ICD-10-CM | POA: Diagnosis not present

## 2019-09-04 DIAGNOSIS — R9431 Abnormal electrocardiogram [ECG] [EKG]: Secondary | ICD-10-CM | POA: Diagnosis not present

## 2019-09-04 DIAGNOSIS — C8330 Diffuse large B-cell lymphoma, unspecified site: Secondary | ICD-10-CM | POA: Diagnosis not present

## 2019-09-04 DIAGNOSIS — I358 Other nonrheumatic aortic valve disorders: Secondary | ICD-10-CM | POA: Diagnosis not present

## 2019-09-04 DIAGNOSIS — I4891 Unspecified atrial fibrillation: Secondary | ICD-10-CM | POA: Insufficient documentation

## 2019-09-04 DIAGNOSIS — I119 Hypertensive heart disease without heart failure: Secondary | ICD-10-CM | POA: Insufficient documentation

## 2019-09-04 DIAGNOSIS — C8331 Diffuse large B-cell lymphoma, lymph nodes of head, face, and neck: Secondary | ICD-10-CM | POA: Diagnosis not present

## 2019-09-04 LAB — ECHOCARDIOGRAM COMPLETE

## 2019-09-04 LAB — CBC WITH DIFFERENTIAL (CANCER CENTER ONLY)
Abs Immature Granulocytes: 0.04 10*3/uL (ref 0.00–0.07)
Basophils Absolute: 0 10*3/uL (ref 0.0–0.1)
Basophils Relative: 1 %
Eosinophils Absolute: 0.4 10*3/uL (ref 0.0–0.5)
Eosinophils Relative: 5 %
HCT: 41.2 % (ref 39.0–52.0)
Hemoglobin: 14.2 g/dL (ref 13.0–17.0)
Immature Granulocytes: 1 %
Lymphocytes Relative: 23 %
Lymphs Abs: 2 10*3/uL (ref 0.7–4.0)
MCH: 32.6 pg (ref 26.0–34.0)
MCHC: 34.5 g/dL (ref 30.0–36.0)
MCV: 94.7 fL (ref 80.0–100.0)
Monocytes Absolute: 0.8 10*3/uL (ref 0.1–1.0)
Monocytes Relative: 9 %
Neutro Abs: 5.3 10*3/uL (ref 1.7–7.7)
Neutrophils Relative %: 61 %
Platelet Count: 263 10*3/uL (ref 150–400)
RBC: 4.35 MIL/uL (ref 4.22–5.81)
RDW: 11.8 % (ref 11.5–15.5)
WBC Count: 8.6 10*3/uL (ref 4.0–10.5)
nRBC: 0 % (ref 0.0–0.2)

## 2019-09-04 NOTE — Patient Instructions (Signed)
Bone Marrow Aspiration and Bone Marrow Biopsy, Adult, Care After This sheet gives you information about how to care for yourself after your procedure. Your health care provider may also give you more specific instructions. If you have problems or questions, contact your health care provider. What can I expect after the procedure? After the procedure, it is common to have:  Mild pain and tenderness.  Swelling.  Bruising. Follow these instructions at home: Puncture site care   Follow instructions from your health care provider about how to take care of the puncture site. Make sure you: ? Wash your hands with soap and water before and after you change your bandage (dressing). If soap and water are not available, use hand sanitizer. ? Change your dressing as told by your health care provider.  Check your puncture site every day for signs of infection. Check for: ? More redness, swelling, or pain. ? Fluid or blood. ? Warmth. ? Pus or a bad smell. Activity  Return to your normal activities as told by your health care provider. Ask your health care provider what activities are safe for you.  Do not lift anything that is heavier than 10 lb (4.5 kg), or the limit that you are told, until your health care provider says that it is safe.  Do not drive for 24 hours if you were given a sedative during your procedure. General instructions   Take over-the-counter and prescription medicines only as told by your health care provider.  Do not take baths, swim, or use a hot tub until your health care provider approves. Ask your health care provider if you may take showers. You may only be allowed to take sponge baths.  If directed, put ice on the affected area. To do this: ? Put ice in a plastic bag. ? Place a towel between your skin and the bag. ? Leave the ice on for 20 minutes, 2-3 times a day.  Keep all follow-up visits as told by your health care provider. This is important. Contact a  health care provider if:  Your pain is not controlled with medicine.  You have a fever.  You have more redness, swelling, or pain around the puncture site.  You have fluid or blood coming from the puncture site.  Your puncture site feels warm to the touch.  You have pus or a bad smell coming from the puncture site. Summary  After the procedure, it is common to have mild pain, tenderness, swelling, and bruising.  Follow instructions from your health care provider about how to take care of the puncture site and what activities are safe for you.  Take over-the-counter and prescription medicines only as told by your health care provider.  Contact a health care provider if you have any signs of infection, such as fluid or blood coming from the puncture site. This information is not intended to replace advice given to you by your health care provider. Make sure you discuss any questions you have with your health care provider. Document Revised: 11/11/2018 Document Reviewed: 11/11/2018 Elsevier Patient Education  2020 Elsevier Inc.  

## 2019-09-04 NOTE — Progress Notes (Signed)
  Echocardiogram 2D Echocardiogram has been performed.  Charles Li 09/04/2019, 11:09 AM

## 2019-09-04 NOTE — Progress Notes (Signed)
INDICATION: DLBCL Procedure performed by Cassandra Heilingoetter PA-C as well. Brief examination was performed. ENT: adequate airway clearance Heart: regular rate and rhythm.No Murmurs Lungs: clear to auscultation, no wheezes, normal respiratory effort  Bone Marrow Biopsy and Aspiration Procedure Note   Informed consent was obtained and potential risks including bleeding, infection and pain were reviewed with the patient.  The patient's name, date of birth, identification, consent and allergies were verified prior to the start of procedure and time out was performed.  The left posterior iliac crest was chosen as the site of biopsy.  The skin was prepped with ChloraPrep.   8 cc of 2% lidocaine was used to provide local anaesthesia.   10 cc of bone marrow aspirate was obtained followed by 1cm biopsy.  Pressure was applied to the biopsy site and bandage was placed over the biopsy site. Patient was made to lie on the back for 30 mins prior to discharge.  The procedure was tolerated well. COMPLICATIONS: None BLOOD LOSS: none The patient was discharged home in stable condition with a 1 week follow up to review results.  Patient was provided with post bone marrow biopsy instructions and instructed to call if there was any bleeding or worsening pain.  Specimens sent for flow cytometry, cytogenetics and additional studies.  Signed Scot Dock, NP

## 2019-09-07 ENCOUNTER — Other Ambulatory Visit: Payer: Self-pay

## 2019-09-07 ENCOUNTER — Ambulatory Visit (HOSPITAL_COMMUNITY)
Admission: RE | Admit: 2019-09-07 | Discharge: 2019-09-07 | Disposition: A | Discharge status: Home / Self Care | Payer: Medicare HMO | Source: Ambulatory Visit | Attending: Hematology and Oncology | Admitting: Hematology and Oncology

## 2019-09-07 ENCOUNTER — Inpatient Hospital Stay: Payer: Medicare HMO | Attending: Hematology and Oncology

## 2019-09-07 ENCOUNTER — Other Ambulatory Visit: Payer: Self-pay | Admitting: Hematology and Oncology

## 2019-09-07 ENCOUNTER — Encounter (HOSPITAL_COMMUNITY): Payer: Self-pay

## 2019-09-07 DIAGNOSIS — J45909 Unspecified asthma, uncomplicated: Secondary | ICD-10-CM | POA: Diagnosis not present

## 2019-09-07 DIAGNOSIS — Z7982 Long term (current) use of aspirin: Secondary | ICD-10-CM | POA: Insufficient documentation

## 2019-09-07 DIAGNOSIS — C8331 Diffuse large B-cell lymphoma, lymph nodes of head, face, and neck: Secondary | ICD-10-CM

## 2019-09-07 DIAGNOSIS — Z5112 Encounter for antineoplastic immunotherapy: Secondary | ICD-10-CM | POA: Insufficient documentation

## 2019-09-07 DIAGNOSIS — Z87891 Personal history of nicotine dependence: Secondary | ICD-10-CM | POA: Insufficient documentation

## 2019-09-07 DIAGNOSIS — I10 Essential (primary) hypertension: Secondary | ICD-10-CM | POA: Insufficient documentation

## 2019-09-07 DIAGNOSIS — Z79899 Other long term (current) drug therapy: Secondary | ICD-10-CM | POA: Insufficient documentation

## 2019-09-07 DIAGNOSIS — Z5111 Encounter for antineoplastic chemotherapy: Secondary | ICD-10-CM | POA: Diagnosis not present

## 2019-09-07 DIAGNOSIS — Z452 Encounter for adjustment and management of vascular access device: Secondary | ICD-10-CM | POA: Diagnosis not present

## 2019-09-07 DIAGNOSIS — E785 Hyperlipidemia, unspecified: Secondary | ICD-10-CM | POA: Insufficient documentation

## 2019-09-07 DIAGNOSIS — C8590 Non-Hodgkin lymphoma, unspecified, unspecified site: Secondary | ICD-10-CM | POA: Insufficient documentation

## 2019-09-07 DIAGNOSIS — Z5189 Encounter for other specified aftercare: Secondary | ICD-10-CM | POA: Insufficient documentation

## 2019-09-07 HISTORY — PX: IR IMAGING GUIDED PORT INSERTION: IMG5740

## 2019-09-07 LAB — GLUCOSE, CAPILLARY: Glucose-Capillary: 105 mg/dL — ABNORMAL HIGH (ref 70–99)

## 2019-09-07 MED ORDER — LIDOCAINE-EPINEPHRINE 1 %-1:100000 IJ SOLN
INTRAMUSCULAR | Status: AC
  Filled 2019-09-07: qty 1

## 2019-09-07 MED ORDER — FENTANYL CITRATE (PF) 100 MCG/2ML IJ SOLN
INTRAMUSCULAR | Status: AC | PRN
  Administered 2019-09-07 (×2): 50 ug via INTRAVENOUS

## 2019-09-07 MED ORDER — MIDAZOLAM HCL 2 MG/2ML IJ SOLN
INTRAMUSCULAR | Status: AC
  Filled 2019-09-07: qty 4

## 2019-09-07 MED ORDER — HEPARIN SOD (PORK) LOCK FLUSH 100 UNIT/ML IV SOLN
INTRAVENOUS | Status: AC
  Filled 2019-09-07: qty 5

## 2019-09-07 MED ORDER — MIDAZOLAM HCL 2 MG/2ML IJ SOLN
INTRAMUSCULAR | Status: AC | PRN
  Administered 2019-09-07 (×2): 1 mg via INTRAVENOUS

## 2019-09-07 MED ORDER — LIDOCAINE HCL (PF) 1 % IJ SOLN
INTRAMUSCULAR | Status: AC | PRN
  Administered 2019-09-07: 5 mL

## 2019-09-07 MED ORDER — FENTANYL CITRATE (PF) 100 MCG/2ML IJ SOLN
INTRAMUSCULAR | Status: AC
  Filled 2019-09-07: qty 2

## 2019-09-07 MED ORDER — VANCOMYCIN HCL IN DEXTROSE 1-5 GM/200ML-% IV SOLN
INTRAVENOUS | Status: AC
  Administered 2019-09-07: 1000 mg via INTRAVENOUS
  Filled 2019-09-07: qty 200

## 2019-09-07 MED ORDER — SODIUM CHLORIDE 0.9 % IV SOLN: INTRAVENOUS | Status: DC

## 2019-09-07 MED ORDER — HEPARIN SOD (PORK) LOCK FLUSH 100 UNIT/ML IV SOLN
INTRAVENOUS | Status: AC | PRN
  Administered 2019-09-07: 500 [IU] via INTRAVENOUS

## 2019-09-07 MED ORDER — VANCOMYCIN HCL IN DEXTROSE 1-5 GM/200ML-% IV SOLN: 1000.0000 mg | Freq: Once | INTRAVENOUS | Status: AC

## 2019-09-07 NOTE — Discharge Instructions (Signed)
Implanted Port Insertion, Care After °This sheet gives you information about how to care for yourself after your procedure. Your health care provider may also give you more specific instructions. If you have problems or questions, contact your health care provider. °What can I expect after the procedure? °After the procedure, it is common to have: °· Discomfort at the port insertion site. °· Bruising on the skin over the port. This should improve over 3-4 days. °Follow these instructions at home: °Port care °· After your port is placed, you will get a manufacturer's information card. The card has information about your port. Keep this card with you at all times. °· Take care of the port as told by your health care provider. Ask your health care provider if you or a family member can get training for taking care of the port at home. A home health care nurse may also take care of the port. °· Make sure to remember what type of port you have. °Incision care ° °  ° °· Follow instructions from your health care provider about how to take care of your port insertion site. Make sure you: °? Wash your hands with soap and water before and after you change your bandage (dressing). If soap and water are not available, use hand sanitizer. °? Change your dressing as told by your health care provider. °? Leave stitches (sutures), skin glue, or adhesive strips in place. These skin closures may need to stay in place for 2 weeks or longer. If adhesive strip edges start to loosen and curl up, you may trim the loose edges. Do not remove adhesive strips completely unless your health care provider tells you to do that. °· Check your port insertion site every day for signs of infection. Check for: °? Redness, swelling, or pain. °? Fluid or blood. °? Warmth. °? Pus or a bad smell. °Activity °· Return to your normal activities as told by your health care provider. Ask your health care provider what activities are safe for you. °· Do not  lift anything that is heavier than 10 lb (4.5 kg), or the limit that you are told, until your health care provider says that it is safe. °General instructions °· Take over-the-counter and prescription medicines only as told by your health care provider. °· Do not take baths, swim, or use a hot tub until your health care provider approves. Ask your health care provider if you may take showers. You may only be allowed to take sponge baths. °· Do not drive for 24 hours if you were given a sedative during your procedure. °· Wear a medical alert bracelet in case of an emergency. This will tell any health care providers that you have a port. °· Keep all follow-up visits as told by your health care provider. This is important. °Contact a health care provider if: °· You cannot flush your port with saline as directed, or you cannot draw blood from the port. °· You have a fever or chills. °· You have redness, swelling, or pain around your port insertion site. °· You have fluid or blood coming from your port insertion site. °· Your port insertion site feels warm to the touch. °· You have pus or a bad smell coming from the port insertion site. °Get help right away if: °· You have chest pain or shortness of breath. °· You have bleeding from your port that you cannot control. °Summary °· Take care of the port as told by your health   care provider. Keep the manufacturer's information card with you at all times. °· Change your dressing as told by your health care provider. °· Contact a health care provider if you have a fever or chills or if you have redness, swelling, or pain around your port insertion site. °· Keep all follow-up visits as told by your health care provider. °This information is not intended to replace advice given to you by your health care provider. Make sure you discuss any questions you have with your health care provider. °Document Revised: 01/21/2018 Document Reviewed: 01/21/2018 °Elsevier Patient Education ©  2020 [employer] Inc[employer] °Moderate Conscious Sedation, Adult, Care After °These instructions provide you with information about caring for yourself after your procedure. Your health care provider may also give you more specific instructions. Your treatment has been planned according to current medical practices, but problems sometimes occur. Call your health care provider if you have any problems or questions after your procedure. °What can I expect after the procedure? °After your procedure, it is common: °· To feel sleepy for several hours. °· To feel clumsy and have poor balance for several hours. °· To have poor judgment for several hours. °· To vomit if you eat too soon. °Follow these instructions at home: °For at least 24 hours after the procedure: ° °· Do not: °? Participate in activities where you could fall or become injured. °? Drive. °? Use heavy machinery. °? Drink alcohol. °? Take sleeping pills or medicines that cause drowsiness. °? Make important decisions or sign legal documents. °? Take care of children on your own. °· Rest. °Eating and drinking °· Follow the diet recommended by your health care provider. °· If you vomit: °? Drink water, juice, or soup when you can drink without vomiting. °? Make sure you have little or no nausea before eating solid foods. °General instructions °· Have a responsible adult stay with you until you are awake and alert. °· Take over-the-counter and prescription medicines only as told by your health care provider. °· If you smoke, do not smoke without supervision. °· Keep all follow-up visits as told by your health care provider. This is important. °Contact a health care provider if: °· You keep feeling nauseous or you keep vomiting. °· You feel light-headed. °· You develop a rash. °· You have a fever. °Get help right away if: °· You have trouble breathing. °This information is not intended to replace advice given to you by your health care provider. Make sure you discuss any  questions you have with your health care provider. °Document Revised: 06/07/2017 Document Reviewed: 10/15/2015 °Elsevier Patient Education © 2020 [employer] Inc. ° °

## 2019-09-07 NOTE — Progress Notes (Addendum)
After return to room post IR procedure; report of patient in afib.  Rowe Robert PA in room; placed on monitor and verified afib rate 50.  Patient asymptomatic rate 50-60; with NO chest pain or SOB.   EKG ordered and completed to verify rhythm and have in chart as baseline.

## 2019-09-07 NOTE — Procedures (Signed)
Pre Procedure Dx: Lymphoma Post Procedural Dx: Same  Successful placement of right IJ approach port-a-cath with tip at the superior caval atrial junction. The catheter is ready for immediate use.  Estimated Blood Loss: Minimal  Complications: None immediate.  Jay Antavia Tandy, MD Pager #: [phone]   

## 2019-09-07 NOTE — Progress Notes (Signed)
Patient ID: Charles Li, male   DOB: 1941-01-02, 79 y.o.   MRN: [id number] Pt noted to be in afib once placed on monitor during port a cath placement. He is currently asymptomatic with rate in upper 50's to mid 60's. Pt can't recall ever being told he has afib but this was also noted during recent echo. Annabelle Harman for oncology made aware. Will also message Dr. Lorenso Courier. Pt does not have a cardiologist. Will obtain EKG.

## 2019-09-07 NOTE — Consult Note (Signed)
Chief Complaint: Patient was seen in consultation today for Port-A-Cath placement  Referring Physician(s): Dorsey,John T IV  Supervising Physician: Sandi Mariscal  Patient Status: [place]  History of Present Illness: Charles Li is a 79 y.o. male with history of newly diagnosed left tonsillar diffuse large B-cell lymphoma who presents today for Port-A-Cath placement for chemotherapy.  Past Medical History:  Diagnosis Date  . Abdominal pain, epigastric 07/14/2009  . Abdominal wall hernia   . ALLERGIC RHINITIS 12/12/2006  . ASTHMA 11/28/2007  . 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 GLAUCOMA 12/12/2006  . 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 HERNIA, VENTRAL 01/04/2009  . HYPERLIPIDEMIA 11/28/2007  . HYPERTENSION 12/12/2006  . Leg swelling 04-24-12   occ., none at present  . RENAL CALCULUS 03/24/2010  . SYNCOPE 06/08/2008  . VENOUS INSUFFICIENCY 01/18/2010  . Visual disturbance 04-24-12   legally blind    Past Surgical History:  Procedure Laterality Date  . CATARACT EXTRACTION  2009 - approximate   bilat & glaucoma surgery (7 ophth surgeries)  . INGUINAL HERNIA REPAIR  05/02/2012   Procedure: HERNIA REPAIR INGUINAL ADULT BILATERAL;  Surgeon: Shann Medal, MD;  Location: [facility];  Service: General;  Laterality: N/A;  . VASECTOMY  04-24-12  . VENTRAL HERNIA REPAIR  05/02/2012   Procedure: LAPAROSCOPIC VENTRAL HERNIA;  Surgeon: Shann Medal, MD;  Location: [facility];  Service: General;  Laterality: N/A;    Allergies: Dorzolamide, Acetazolamide, Bimatoprost, Brimonidine, Sulfa antibiotics, Sulfamethoxazole, Dorzolamide hcl-timolol mal, Erythromycin, Penicillins, and Sulfonamide derivatives  Medications: Prior to Admission medications   Medication Sig Start Date End Date Taking? Authorizing Provider  amLODipine (NORVASC) 5 MG tablet Take 1 tablet (5 mg total) by mouth daily. 07/14/18   Burchette, Alinda Sierras, MD  aspirin 81 MG tablet Take 81 mg by mouth at bedtime.    [provider]  benazepril-hydrochlorthiazide (LOTENSIN HCT) 20-12.5 MG tablet Take 1 tablet by mouth daily. 07/14/18   Burchette, Alinda Sierras, MD  desonide (DESOWEN) 0.05 % ointment Apply sparingly to lids as needed for dermatitis. 09/16/18   [provider]  dorzolamide-timolol (COSOPT) 22.3-6.8 MG/ML ophthalmic solution Place 1 drop into both eyes 2 times daily. 04/01/19 03/31/20  [provider]  ibuprofen (ADVIL) 200 MG tablet Take 200 mg by mouth every 6 (six) hours as needed.    [provider]  LORazepam (ATIVAN) 1 MG tablet Take 1 tablet (1 mg total) by mouth as directed. Take 30 minutes to 1 hour prior to PET CT scan. 08/17/19   Orson Slick, MD  Multiple Vitamin (MULTIVITAMIN) capsule Take 1 capsule by mouth daily.    [provider]  Netarsudil-Latanoprost (ROCKLATAN) 0.02-0.005 % SOLN Place 1 drop into both eyes daily. 04/01/19   [provider]  oxyCODONE (OXY IR/ROXICODONE) 5 MG immediate release tablet Take 1 tablet (5 mg total) by mouth every 6 (six) hours as needed for severe pain or breakthrough pain. 08/17/19   Orson Slick, MD  pravastatin (PRAVACHOL) 40 MG tablet Take 1 tablet (40 mg total) by mouth daily. Please schedule an appointment for further refills. [phone] 07/14/18   Burchette, Alinda Sierras, MD  senna-docusate (SENOKOT-S) 8.6-50 MG tablet Take 1 tablet by mouth at bedtime. 08/17/19   Orson Slick, MD  TIMOPTIC OCUDOSE 0.5 % SOLN 1 drop 2 (two) times daily.  01/24/12   [provider]     Family History  Problem Relation Age of Onset  . Cancer Mother   .  Pneumonia Father   . Heart disease Brother        congen heart disease  . Birth defects Paternal Aunt        lung  . Colon cancer Neg Hx   . Stomach cancer Neg Hx     Social History   Socioeconomic History  . Marital status: Married    Spouse name: Not on file  . Number of children: Not on file  . Years of education: Not on file  . Highest education  level: Not on file  Occupational History  . Not on file  Tobacco Use  . Smoking status: Former Smoker    Packs/day: 1.50    Years: 14.00    Pack years: 21.00    Types: Cigarettes    Quit date: 11/06/1969    Years since quitting: 49.8  . Smokeless tobacco: Never Used  Substance and Sexual Activity  . Alcohol use: Yes    Alcohol/week: 14.0 standard drinks    Types: 14 Glasses of wine per week    Comment: wine & beer - 3 glasses daily  . Drug use: No  . Sexual activity: Yes  Other Topics Concern  . Not on file  Social History Narrative  . Not on file   Social Determinants of Health   Financial Resource Strain:   . Difficulty of Paying Living Expenses: Not on file  Food Insecurity:   . Worried About Charity fundraiser in the Last Year: Not on file  . Ran Out of Food in the Last Year: Not on file  Transportation Needs:   . Lack of Transportation (Medical): Not on file  . Lack of Transportation (Non-Medical): Not on file  Physical Activity:   . Days of Exercise per Week: Not on file  . Minutes of Exercise per Session: Not on file  Stress:   . Feeling of Stress : Not on file  Social Connections:   . Frequency of Communication with Friends and Family: Not on file  . Frequency of Social Gatherings with Friends and Family: Not on file  . Attends Religious Services: Not on file  . Active Member of Clubs or Organizations: Not on file  . Attends Archivist Meetings: Not on file  . Marital Status: Not on file      Review of Systems currently denies fever, headache, chest pain, dyspnea, cough, abdominal/back pain, nausea, vomiting or bleeding.  He is legally blind  Vital Signs: Blood pressure 159/97, heart rate 59, temp 98.5, respirations 16, O2 sat 99% room air   Physical Exam awake, alert.  Chest clear to auscultation bilaterally.  Heart with slightly bradycardic but regular rhythm.  Abdomen soft, positive bowel sounds, nontender.  Trace pretibial edema  bilaterally.  Imaging: NM PET Image Initial (PI) Skull Base To Thigh  Result Date: 08/28/2019 CLINICAL DATA:  Initial treatment strategy for large B-cell lymphoma. EXAM: NUCLEAR MEDICINE PET SKULL BASE TO THIGH TECHNIQUE: Ten mCi F-18 FDG was injected intravenously. Full-ring PET imaging was performed from the skull base to thigh after the radiotracer. CT data was obtained and used for attenuation correction and anatomic localization. Fasting blood glucose: 117 mg/dl COMPARISON:  Neck CT 08/11/2019 FINDINGS: Mediastinal blood pool activity: SUV max 2.7 Liver activity: SUV max 3.3 NECK: Intense hypermetabolic activity localizing to the LEFT lingual tonsil with SUV max equal 23.7. Enlarged hypermetabolic LEFT level 2 lymph node measures 18 mm short axis with SUV max equal 44.6. Smaller hypermetabolic LEFT level III lymph node  measures 5 mm (image 38/4) with SUV max equal 3.6 Incidental CT findings: none CHEST: No hypermetabolic mediastinal lymph nodes. Hypermetabolic supraclavicular nodes. No hypermetabolic axillary nodes. No enlarged lymph nodes. Within the superior segment LEFT lower lobe, lobular nodule measuring 9 mm (image 80/4). This pulmonary nodule has mild to moderate metabolic activity SUV max equal 3.0 Incidental CT findings: none ABDOMEN/PELVIS: No abnormal hypermetabolic activity within the liver, pancreas, adrenal glands, or spleen. No hypermetabolic lymph nodes in the abdomen or pelvis. Spleen is normal volume and normal metabolic activity. Incidental CT findings: Severe RIGHT hydronephrosis related to concerning calculus in the mid RIGHT ureter. Calculus measures 7 mm on image 150/4. There is renal cortical thinning on the RIGHT consistent with chronic hydronephrosis. SKELETON: No focal hypermetabolic activity to suggest skeletal metastasis. Incidental CT findings: none IMPRESSION: 1. Intense hypermetabolic activity associated with the LEFT lingual tonsil consistent with high-grade lymphoma. 2.  Enlarged hypermetabolic LEFT level 2 lymph node consistent with high-grade lymphoma. 3. Smaller LEFT level 3 lymph node is hypermetabolic and concerning for lymphoma involvement 4. Small moderately metabolic LEFT lobe nodule is indeterminate. Recommend attention on post therapy scans. 5. Severe chronic RIGHT hydronephrosis secondary to obstructing calculus in the mid RIGHT ureter. Electronically Signed   By: Suzy Bouchard M.D.   On: 08/28/2019 16:41   ECHOCARDIOGRAM COMPLETE  Result Date: 09/04/2019    ECHOCARDIOGRAM REPORT   Patient Name:   RAJIV SUPPES Thornell Date of Exam: 09/04/2019 Medical Rec #:  [id number]        Height:       70.0 in Accession #:    [id number]       Weight:       190.1 lb Date of Birth:  03/20/1941        BSA:          2.043 m Patient Age:    75 years         BP:           166/94 mmHg Patient Gender: M                HR:           48 bpm. Exam Location:  Inpatient Procedure: 2D Echo, Cardiac Doppler, Color Doppler and Strain Analysis Indications:    Z51.11 Encounter for antineoplastic chemotheraphy  History:        Patient has prior history of Echocardiogram examinations, most                 recent 06/08/2008. Abnormal ECG, Signs/Symptoms:Syncope; Risk                 Factors:Hypertension and Dyslipidemia. Lymphoma. Edema.                 Bradycardia.  Sonographer:    Roseanna Rainbow Referring Phys: [id number] Ledell Peoples IV  Sonographer Comments: It appears the patient has an abnormal rhythm during this exam. Patient is legally blind. IMPRESSIONS  1. Pt in atrial fibrillation during the study; normal LV function; mild LVH; mild biatrial enlargement; trace AI.  2. Left ventricular ejection fraction, by estimation, is 60 to 65%. The left ventricle has normal function. The left ventricle has no regional wall motion abnormalities. There is mild left ventricular hypertrophy. Left ventricular diastolic function could not be evaluated. Left ventricular diastolic function could not be evaluated.  3.  Right ventricular systolic function is normal. The right ventricular size is normal. There is normal pulmonary artery systolic pressure.  4.  Left atrial size was mildly dilated.  5. Right atrial size was mildly dilated.  6. The mitral valve is normal in structure and function. Trivial mitral valve regurgitation. No evidence of mitral stenosis.  7. The aortic valve is tricuspid. Aortic valve regurgitation is trivial. Mild aortic valve sclerosis is present, with no evidence of aortic valve stenosis.  8. The inferior vena cava is normal in size with greater than 50% respiratory variability, suggesting right atrial pressure of 3 mmHg. FINDINGS  Left Ventricle: Left ventricular ejection fraction, by estimation, is 60 to 65%. The left ventricle has normal function. The left ventricle has no regional wall motion abnormalities. The left ventricular internal cavity size was normal in size. There is  mild left ventricular hypertrophy. Left ventricular diastolic function could not be evaluated due to atrial fibrillation. Left ventricular diastolic function could not be evaluated. Right Ventricle: The right ventricular size is normal.Right ventricular systolic function is normal. There is normal pulmonary artery systolic pressure. The tricuspid regurgitant velocity is 2.40 m/s, and with an assumed right atrial pressure of 3 mmHg, the estimated right ventricular systolic pressure is [id number] mmHg. Left Atrium: Left atrial size was mildly dilated. Right Atrium: Right atrial size was mildly dilated. Pericardium: There is no evidence of pericardial effusion. Mitral Valve: The mitral valve is normal in structure and function. Normal mobility of the mitral valve leaflets. Mild mitral annular calcification. Trivial mitral valve regurgitation. No evidence of mitral valve stenosis. Tricuspid Valve: The tricuspid valve is normal in structure. Tricuspid valve regurgitation is trivial. No evidence of tricuspid stenosis. Aortic Valve: The  aortic valve is tricuspid. Aortic valve regurgitation is trivial. Mild aortic valve sclerosis is present, with no evidence of aortic valve stenosis. Pulmonic Valve: The pulmonic valve was not well visualized. Pulmonic valve regurgitation is trivial. No evidence of pulmonic stenosis. Aorta: The aortic root is normal in size and structure. Venous: The inferior vena cava is normal in size with greater than 50% respiratory variability, suggesting right atrial pressure of 3 mmHg. IAS/Shunts: No atrial level shunt detected by color flow Doppler. Additional Comments: Pt in atrial fibrillation during the study; normal LV function; mild LVH; mild biatrial enlargement; trace AI.  LEFT VENTRICLE PLAX 2D LVIDd:         4.50 cm LVIDs:         2.90 cm LV PW:         1.50 cm LV IVS:        1.30 cm LVOT diam:     2.10 cm LV SV:         70 LV SV Index:   34 LVOT Area:     3.46 cm  LV Volumes (MOD) LV vol d, MOD A2C: 88.4 ml LV vol d, MOD A4C: 101.0 ml LV vol s, MOD A2C: 43.3 ml LV vol s, MOD A4C: 31.1 ml LV SV MOD A2C:     45.1 ml LV SV MOD A4C:     101.0 ml LV SV MOD BP:      62.4 ml RIGHT VENTRICLE         IVC TAPSE (M-mode): 2.7 cm  IVC diam: 2.10 cm LEFT ATRIUM             Index       RIGHT ATRIUM           Index LA diam:        4.40 cm 2.15 cm/m  RA Area:     23.70 cm LA Vol (A2C):  63.1 ml 30.89 ml/m RA Volume:   80.00 ml  39.16 ml/m LA Vol (A4C):   62.4 ml 30.55 ml/m LA Biplane Vol: 66.3 ml 32.46 ml/m  AORTIC VALVE LVOT Vmax:   93.80 cm/s LVOT Vmean:  64.900 cm/s LVOT VTI:    0.202 m  AORTA Ao Root diam: 3.00 cm Ao Asc diam:  3.70 cm MITRAL VALVE                TRICUSPID VALVE MV Area (PHT): 4.27 cm     TR Peak grad:   23.0 mmHg MV Decel Time: 178 msec     TR Vmax:        240.00 cm/s MV E velocity: 102.50 cm/s                             SHUNTS                             Systemic VTI:  0.20 m                             Systemic Diam: 2.10 cm Kirk Ruths MD Electronically signed by Kirk Ruths MD Signature  Date/Time: 09/04/2019/11:17:52 AM    Final     Labs:  CBC: Recent Labs    08/17/19 1140 09/04/19 0828  WBC 10.0 8.6  HGB 15.1 14.2  HCT 44.1 41.2  PLT 251 263    COAGS: No results for input(s): INR, APTT in the last 8760 hours.  BMP: Recent Labs    08/17/19 1140  NA 142  K 3.6  CL 104  CO2 30  GLUCOSE 100*  BUN 17  CALCIUM 9.4  CREATININE 0.98  GFRNONAA >60  GFRAA >60    LIVER FUNCTION TESTS: Recent Labs    08/17/19 1140  BILITOT 0.7  AST 12*  ALT 12  ALKPHOS 59  PROT 7.0  ALBUMIN 4.0    TUMOR MARKERS: No results for input(s): AFPTM, CEA, CA199, CHROMGRNA in the last 8760 hours.  Assessment and Plan: 79 y.o. male with history of newly diagnosed left tonsillar diffuse large B-cell lymphoma who presents today for Port-A-Cath placement for chemotherapy. Risks and benefits of image guided port-a-catheter placement was discussed with the patient including, but not limited to bleeding, infection, pneumothorax, or fibrin sheath development and need for additional procedures.  All of the patient's questions were answered, patient is agreeable to proceed. Consent signed and in chart.     Thank you for this interesting consult.  I greatly enjoyed meeting HEIKO STAUDT and look forward to participating in their care.  A copy of this report was sent to the requesting provider on this date.  Electronically Signed: D. Rowe Robert, PA-C 09/07/2019, 1:22 PM   I spent a total of  25 minutes   in face to face in clinical consultation, greater than 50% of which was counseling/coordinating care for Port-A-Cath placement

## 2019-09-08 ENCOUNTER — Other Ambulatory Visit: Payer: Self-pay | Admitting: Hematology and Oncology

## 2019-09-08 ENCOUNTER — Telehealth: Payer: Self-pay | Admitting: Adult Health

## 2019-09-08 LAB — SURGICAL PATHOLOGY

## 2019-09-08 NOTE — Telephone Encounter (Signed)
Called Sabin and let him know that I spoke with Rowe Robert yesterday with radiology about his paroxysmal atrial fibrillation.  I reviewed that he is supposed to start chemotherapy, and his echocardiogram had noted the a fib as well.  He has never been told that he has a fib previously.  I reviewed with Matei that being in this heart rhythm does slightly increase his risk of stroke and that Dr. Lorenso Courier and I spoke and he needs to see a cardiologist to review this along with the fact that he is starting a chemotherapy regimen that can be cardiotoxic.  Garritt understands this.  He is going to continue his baby ASA, and I sent a message to Dr. Haroldine Laws, Dr. Aundra Dubin, and Kevan Rosebush RN to get him in, hopefully prior to his chemotherapy start date which is 09/14/2019.  Kemontae verbalized understanding of the above, and voiced his appreciation for the call, and his painless bone marrow biopsy that we performed last week.    He knows to call for any questions or concerns.    Wilber Bihari, NP

## 2019-09-09 ENCOUNTER — Telehealth: Payer: Self-pay | Admitting: Hematology and Oncology

## 2019-09-09 ENCOUNTER — Telehealth: Payer: Self-pay

## 2019-09-09 DIAGNOSIS — N132 Hydronephrosis with renal and ureteral calculous obstruction: Secondary | ICD-10-CM

## 2019-09-09 MED ORDER — LIDOCAINE-PRILOCAINE 2.5-2.5 % EX CREA: 1.0000 "application " | TOPICAL_CREAM | CUTANEOUS | 3 refills | Status: DC | PRN

## 2019-09-09 MED ORDER — PROCHLORPERAZINE MALEATE 10 MG PO TABS: 10.0000 mg | ORAL_TABLET | Freq: Four times a day (QID) | ORAL | 1 refills | Status: DC | PRN

## 2019-09-09 MED ORDER — ETOPOSIDE 50 MG PO CAPS: 100.0000 mg/m2/d | ORAL_CAPSULE | Freq: Every day | ORAL | 2 refills | Status: DC

## 2019-09-09 MED ORDER — PREDNISONE 20 MG PO TABS: 100.0000 mg | ORAL_TABLET | Freq: Every day | ORAL | 2 refills | Status: AC

## 2019-09-09 MED ORDER — ONDANSETRON HCL 8 MG PO TABS: 8.0000 mg | ORAL_TABLET | Freq: Three times a day (TID) | ORAL | 1 refills | Status: DC | PRN

## 2019-09-09 MED ORDER — PANTOPRAZOLE SODIUM 40 MG PO TBEC: 40.0000 mg | DELAYED_RELEASE_TABLET | Freq: Every day | ORAL | 1 refills | Status: DC

## 2019-09-09 MED ORDER — PREDNISONE 20 MG PO TABS: 100.0000 mg | ORAL_TABLET | Freq: Every day | ORAL | 2 refills | Status: DC

## 2019-09-09 NOTE — Telephone Encounter (Signed)
Oral Oncology Patient Advocate Encounter  After completing a benefits investigation, prior authorization for Etoposide is not required at this time through [facility].  Patient's copay is $64.38.  [place] Patient [place] Phone [phone] Fax [phone] 09/09/2019 1:42 PM

## 2019-09-09 NOTE — Telephone Encounter (Signed)
Called Mr. Charles Li to discuss the results of the bone marrow biopsy, the echocardiogram, and to discuss any concerns he has moving forward prior to the start of treatment.  Unfortunately on his echocardiogram he was found to be in atrial fibrillation.  This was also noted when he had his port placed.  Due to concern for his atrial fibrillation we have recommended that anthracycline therapy be substituted for etoposide.  As such he will be receiving R-CEOP instead of the traditional R-CHOP.  This will entail having IV etoposide administered on day 1 followed by oral etoposide on days 2 and 3. Today we discussed in greater detail the risks and benefits of R-CEOP therapy. We discussed the possible side effects including nausea, vomiting, diarrhea, fatigue, infections, nerve pain, blood/iver abnormalities, kidney damage. He voiced his understanding of the risks/benefits.   Due to this development of a new atrial fibrillation I have requested that the patient be evaluated by cardiology for consideration of treatment.  Of note he does tend to have a baseline low heart rate and does not appear to need emergent rate control at this time.  Additionally after review the PET CT scan it does appear the patient has severe hydronephrosis of the right kidney likely secondary to obstructing calculi.  Fortunately his renal function appears normal at baseline and therefore there appears to be some compensation.  I do not feel that this would necessarily stall the start of chemotherapy, however we will have him seen by urology on a routine basis to determine if any intervention is required.  Overall at this time I favor the patient receiving 3 cycles of R-CEOP with reevaluation and consideration of ISRT to the affected lymph nodes.  [name] has voiced his understanding of the above findings and the plan moving forward.  We are currently clear for start of chemotherapy on 09/14/2019.  Ledell Peoples, MD Department of  Hematology/Oncology [place] [facility] Phone: [phone] Pager: [phone] Email: [email or web address]

## 2019-09-10 ENCOUNTER — Telehealth: Payer: Self-pay | Admitting: *Deleted

## 2019-09-10 ENCOUNTER — Other Ambulatory Visit: Payer: Self-pay | Admitting: Hematology and Oncology

## 2019-09-10 ENCOUNTER — Telehealth: Payer: Self-pay | Admitting: Pharmacist

## 2019-09-10 DIAGNOSIS — R69 Illness, unspecified: Secondary | ICD-10-CM | POA: Diagnosis not present

## 2019-09-10 MED ORDER — ETOPOSIDE 50 MG PO CAPS: 100.0000 mg/m2/d | ORAL_CAPSULE | Freq: Every day | ORAL | 0 refills | Status: DC

## 2019-09-10 MED FILL — ETOPOSIDE 50 MG CAPSULE: 50 | 4 days supply | Qty: 8 | Fill #0

## 2019-09-10 NOTE — Telephone Encounter (Signed)
Received call from patient to clarify his new medications and where to pick them up. 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 He has 5 new meds all of which will be filled at [facility]. These are : EMLA cream for his port; 2 medications for nausea-Zofran and compazine; Prednisone and oral Etoposide. The oral Etoposide is a speciality pharmacy  Medication and should be ready for pick up tomorrow afternoon, along with the other medications.  Pt voiced understanding. Pt has an appointment with Dr. Haroldine Laws @ 10 am tomorrow. Provided directions to the [facility] on [address]. Pt again voiced understanding.  He knows to call here with any questions or concerns.

## 2019-09-10 NOTE — Telephone Encounter (Signed)
Oral Chemotherapy Pharmacist Encounter  Mr. Damico plans on picking up his etoposide from [place] on 09/11/19. He know he will take his first dose the day after his Monday infusion.  Patient Education I spoke with patient for overview of new oral chemotherapy medication: etoposide for the treatment of DLBCL, planned duration until disease control or unacceptable drug toxicity.   Pt is doing well. Counseled patient on administration, dosing, side effects, monitoring, drug-food interactions, safe handling, storage, and disposal. Patient will take Take 4 capsules (200 mg total) by mouth daily for 2 days. Take 4 capsules on Day 2 and Day 3 after receiving IV chemotherapy on Day 1.  Side effects include but not limited to: decreased wbc, N/V, hair loss.    Reviewed with patient importance of keeping a medication schedule and plan for any missed doses.  Mr. Zavaleta voiced understanding and appreciation. All questions answered. Medication handout placed in the mail.  Provided patient with Oral [place] phone number. Patient knows to call the office with questions or concerns. Oral Chemotherapy Navigation Clinic will continue to follow.  Darl Pikes, PharmD, BCPS, [facility] Hematology/Oncology Clinical Pharmacist [facility] [place] [facility] [phone]  09/10/2019 4:31 PM

## 2019-09-10 NOTE — Telephone Encounter (Signed)
Oral Oncology Pharmacist Encounter  Received new prescription for etoposide for the treatment of DLBCL in conjunction with R-CEOP infusion regimen, planned duration until disease control or unacceptable drug toxicity.  CMP from 08/17/19 assessed, no relevant lab abnormalities. Prescription dose and frequency assessed.   Current medication list in Epic reviewed, no DDIs with etoposide identified.  Prescription has been e-scribed to the [facility] for benefits analysis and approval.  Oral Oncology Clinic will continue to follow for insurance authorization, copayment issues, initial counseling and start date.  Darl Pikes, PharmD, BCPS, [facility] Hematology/Oncology Clinical Pharmacist [facility] [place] [facility] [phone]  09/10/2019 4:31 PM

## 2019-09-11 ENCOUNTER — Other Ambulatory Visit: Payer: Self-pay | Admitting: *Deleted

## 2019-09-11 ENCOUNTER — Encounter (HOSPITAL_COMMUNITY): Payer: Self-pay | Admitting: Internal Medicine

## 2019-09-11 ENCOUNTER — Other Ambulatory Visit: Payer: Self-pay

## 2019-09-11 ENCOUNTER — Ambulatory Visit (HOSPITAL_COMMUNITY)
Admission: RE | Admit: 2019-09-11 | Discharge: 2019-09-11 | Disposition: A | Discharge status: Home / Self Care | Payer: Medicare HMO | Source: Ambulatory Visit | Attending: Internal Medicine | Admitting: Internal Medicine

## 2019-09-11 VITALS — BP 134/66 | HR 53 | Wt 190.8 lb

## 2019-09-11 DIAGNOSIS — E785 Hyperlipidemia, unspecified: Secondary | ICD-10-CM | POA: Diagnosis not present

## 2019-09-11 DIAGNOSIS — E781 Pure hyperglyceridemia: Secondary | ICD-10-CM | POA: Insufficient documentation

## 2019-09-11 DIAGNOSIS — C8510 Unspecified B-cell lymphoma, unspecified site: Secondary | ICD-10-CM | POA: Insufficient documentation

## 2019-09-11 DIAGNOSIS — I4891 Unspecified atrial fibrillation: Secondary | ICD-10-CM | POA: Diagnosis not present

## 2019-09-11 DIAGNOSIS — C8331 Diffuse large B-cell lymphoma, lymph nodes of head, face, and neck: Secondary | ICD-10-CM

## 2019-09-11 DIAGNOSIS — Z87891 Personal history of nicotine dependence: Secondary | ICD-10-CM | POA: Diagnosis not present

## 2019-09-11 DIAGNOSIS — Z7982 Long term (current) use of aspirin: Secondary | ICD-10-CM | POA: Diagnosis not present

## 2019-09-11 DIAGNOSIS — Z888 Allergy status to other drugs, medicaments and biological substances status: Secondary | ICD-10-CM | POA: Insufficient documentation

## 2019-09-11 DIAGNOSIS — Z881 Allergy status to other antibiotic agents status: Secondary | ICD-10-CM | POA: Diagnosis not present

## 2019-09-11 DIAGNOSIS — I872 Venous insufficiency (chronic) (peripheral): Secondary | ICD-10-CM | POA: Insufficient documentation

## 2019-09-11 DIAGNOSIS — Z8249 Family history of ischemic heart disease and other diseases of the circulatory system: Secondary | ICD-10-CM | POA: Insufficient documentation

## 2019-09-11 DIAGNOSIS — Z7952 Long term (current) use of systemic steroids: Secondary | ICD-10-CM | POA: Diagnosis not present

## 2019-09-11 DIAGNOSIS — Z882 Allergy status to sulfonamides status: Secondary | ICD-10-CM | POA: Insufficient documentation

## 2019-09-11 DIAGNOSIS — Z79899 Other long term (current) drug therapy: Secondary | ICD-10-CM | POA: Insufficient documentation

## 2019-09-11 DIAGNOSIS — Z880 Allergy status to penicillin: Secondary | ICD-10-CM | POA: Diagnosis not present

## 2019-09-11 DIAGNOSIS — I10 Essential (primary) hypertension: Secondary | ICD-10-CM | POA: Diagnosis not present

## 2019-09-11 DIAGNOSIS — Z87442 Personal history of urinary calculi: Secondary | ICD-10-CM | POA: Diagnosis not present

## 2019-09-11 DIAGNOSIS — R0683 Snoring: Secondary | ICD-10-CM | POA: Insufficient documentation

## 2019-09-11 DIAGNOSIS — H548 Legal blindness, as defined in USA: Secondary | ICD-10-CM | POA: Insufficient documentation

## 2019-09-11 MED FILL — PROCHLORPERAZINE 10 MG TAB: 10 | 8 days supply | Qty: 30 | Fill #0

## 2019-09-11 MED FILL — predniSONE 20 MG TABS: 20 | 5 days supply | Qty: 25 | Fill #0

## 2019-09-11 MED FILL — PANTOPRAZOLE SOD DR 40 MG T: 40 | 90 days supply | Qty: 90 | Fill #0

## 2019-09-11 MED FILL — ONDANSETRON HCL 8 MG TABLET: 8 | 7 days supply | Qty: 20 | Fill #0

## 2019-09-11 MED FILL — LIDOCAINE-PRILOCAINE CREAM: 2.5-2.5 | 5 days supply | Qty: 30 | Fill #0

## 2019-09-11 NOTE — Progress Notes (Addendum)
Patient Name: Charles Li        DOB: 01/20/1841      Height: 5'10    Weight: 190 lbs  Office Name: Heart and Vascular         Referring Provider: Glori Bickers  Today's Date: 09/11/2019  Date:   STOP BANG RISK ASSESSMENT S (snore) Have you been told that you snore?     NO   T (tired) Are you often tired, fatigued, or sleepy during the day?   YES  O (obstruction) Do you stop breathing, choke, or gasp during sleep? YES  P (pressure) Do you have or are you being treated for high blood pressure? YES   B (BMI) Is your body index greater than 35 kg/m? NO   A (age) Are you 40 years old or older? YES   N (neck) Do you have a neck circumference greater than 16 inches?      G (gender) Are you a male? YES   TOTAL STOP/BANG "YES" ANSWERS 5                                                                       For Office Use Only              Procedure Order Form    YES to 3+ Stop Bang questions OR two clinical symptoms - patient qualifies for WatchPAT (CPT 95800)     Submit: This Form + Patient Face Sheet + Clinical Note via CloudPAT or Fax: [phone]         Clinical Notes: Will consult Sleep Specialist and refer for management of therapy due to patient increased risk of Sleep Apnea. Ordering a sleep study due to the following two clinical symptoms: Excessive daytime sleepiness G47.10 / Gastroesophageal reflux K21.9 / Nocturia R35.1 / Morning Headaches G44.221 / Difficulty concentrating R41.840 / Memory problems or poor judgment G31.84 / Personality changes or irritability R45.4 / Loud snoring R06.83 / Depression F32.9 / Unrefreshed by sleep G47.8 / Impotence N52.9 / History of high blood pressure R03.0 / Insomnia G47.00    I understand that I am proceeding with a home sleep apnea test as ordered by my treating physician. I understand that untreated sleep apnea is a serious cardiovascular risk factor and it is my responsibility to perform the test and seek management for sleep  apnea. I will be contacted with the results and be managed for sleep apnea by a local sleep physician. I will be receiving equipment and further instructions from [facility]. I shall promptly ship back the equipment via the included mailing label. I understand my insurance will be billed for the test and as the patient I am responsible for any insurance related out-of-pocket costs incurred. I have been provided with written instructions and can call for additional video or telephonic instruction, with 24-hour availability of qualified personnel to answer any questions: Patient Help Desk [phone].  Patient Signature ______________________________________________________   Date______________________ Patient Telemedicine Verbal Consent

## 2019-09-11 NOTE — Patient Instructions (Addendum)
EKG today!   Your provider has recommended that you have a home sleep study.  [employer] is the company that does these test.  They will contact you by phone and must speak with you before they can ship the equipment.  Once they have spoken with you they will send the equipment right to your home with instructions on how to set it up.  Once you have completed the test simply box all the equipment back up and mail back to the company.  IF you have any questions or issues with the equipment please call the company directly at [phone].  If your test is positive for sleep apnea and you need a home CPAP machine you will be contacted by Dr Theodosia Blender office [facility]) to set this up.   You have been referred to Dr Rayann Heman for management of your heart condition. His office will call you to schedule an appointment.   Your physician recommends that you schedule a follow up as needed. Please call office at [phone] option [phone] if you have any questions or concerns.   At the [facility], you and your health needs are our priority. As part of our continuing mission to provide you with exceptional heart care, we have created designated Provider Care Teams. These Care Teams include your primary Cardiologist (physician) and Advanced Practice Providers (APPs- Physician Assistants and Nurse Practitioners) who all work together to provide you with the care you need, when you need it.   You may see any of the following providers on your designated Care Team at your next follow up: Marland Kitchen Dr Glori Bickers . Dr Loralie Champagne . Darrick Grinder, NP . Lyda Jester, PA . Audry Riles, PharmD   Please be sure to bring in all your medications bottles to every appointment.

## 2019-09-11 NOTE — Progress Notes (Addendum)
CARDIOLOGY  CLINIC CONSULT NOTE  Referring Physician: Dr. Armandina Gemma Primary Care: Dr. Elease Hashimoto   HPI:  79 y/o male with h/o HTN, hypertriglyceridemia, legal blindness and recently diagnosed B-cell lymphoma referred by Dr. Lorenso Courier for further evaluation of heart arrhythmia.   Denies any h/o heart problems. On 3/1 had ECG as part of his work-up and showed slow AF at 49 bpm. (not on AV nodal blocker)   Eco 09/04/19 EF 60-65% moderate biatrial enlargement. Personally reviewed  Has had high BP for long time. Denies snoring. No palpitations. Has never been told in past that his HR was irregular. Relatively active walking the dog, etc but doesn't exercise. No CP. Gets winded more quickly now than he used to. Says he has never seen evidence of irregular HR on BP monitor in past Drinks 1 glass of wine per day.   No Fhx of CAD.    Review of Systems: [y] = yes, [ ]  = no   General: Weight gain [ ] ; Weight loss [ ] ; Anorexia [ ] ; Fatigue [ ] ; Fever [ ] ; Chills [ ] ; Weakness [ ]   Cardiac: Chest pain/pressure [ ] ; Resting SOB [ ] ; Exertional SOB [ ] ; Orthopnea [ ] ; Pedal Edema [ ] ; Palpitations [ ] ; Syncope [ ] ; Presyncope [ ] ; Paroxysmal nocturnal dyspnea[ ]   Pulmonary: Cough [ ] ; Wheezing[ ] ; Hemoptysis[ ] ; Sputum [ ] ; Snoring [ ]   GI: Vomiting[ ] ; Dysphagia[ ] ; Melena[ ] ; Hematochezia [ ] ; Heartburn[ ] ; Abdominal pain [ ] ; Constipation [ ] ; Diarrhea [ ] ; BRBPR [ ]   GU: Hematuria[ ] ; Dysuria [ ] ; Nocturia[ ]   Vascular: Pain in legs with walking [ ] ; Pain in feet with lying flat [ ] ; Non-healing sores [ ] ; Stroke [ ] ; TIA [ ] ; Slurred speech [ ] ;  Neuro: Headaches[ ] ; Vertigo[ ] ; Seizures[ ] ; Paresthesias[ ] ;Blurred vision [ ] ; Diplopia [ ] ; Vision changes [ ]   Ortho/Skin: Arthritis [ ] ; Joint pain [ ] ; Muscle pain [ ] ; Joint swelling [ ] ; Back Pain [ ] ; Rash [ ]   Psych: Depression[ ] ; Anxiety[ ]   Heme: Bleeding problems [ ] ; Clotting disorders [ ] ; Anemia [ ]   Endocrine: Diabetes [ ] ; Thyroid  dysfunction[ ]    Past Medical History:  Diagnosis Date  . Abdominal pain, epigastric 07/14/2009  . Abdominal wall hernia   . ALLERGIC RHINITIS 12/12/2006  . ASTHMA 11/28/2007  . 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 GLAUCOMA 12/12/2006  . 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 HERNIA, VENTRAL 01/04/2009  . HYPERLIPIDEMIA 11/28/2007  . HYPERTENSION 12/12/2006  . Leg swelling 04-24-12   occ., none at present  . RENAL CALCULUS 03/24/2010  . SYNCOPE 06/08/2008  . VENOUS INSUFFICIENCY 01/18/2010  . Visual disturbance 04-24-12   legally blind    Current Outpatient Medications  Medication Sig Dispense Refill  . amLODipine (NORVASC) 5 MG tablet Take 1 tablet (5 mg total) by mouth daily. 90 tablet 3  . aspirin 81 MG tablet Take 81 mg by mouth at bedtime.    . benazepril-hydrochlorthiazide (LOTENSIN HCT) 20-12.5 MG tablet Take 1 tablet by mouth daily. 90 tablet 3  . desonide (DESOWEN) 0.05 % ointment Apply sparingly to lids as needed for dermatitis.    Marland Kitchen dorzolamide-timolol (COSOPT) 22.3-6.8 MG/ML ophthalmic solution Place 1 drop into both eyes 2 times daily.    Marland Kitchen etoposide (VEPESID) 50 MG capsule Take 4 capsules (200 mg total) by mouth daily for 2 days. Take 4 capsules on Day 2 and Day 3 after receiving IV chemotherapy  on Day 1. 8 capsule 0  . ibuprofen (ADVIL) 200 MG tablet Take 200 mg by mouth every 6 (six) hours as needed.    . Multiple Vitamin (MULTIVITAMIN) capsule Take 1 capsule by mouth daily.    . Netarsudil-Latanoprost (ROCKLATAN) 0.02-0.005 % SOLN Place 1 drop into both eyes daily.    . ondansetron (ZOFRAN) 8 MG tablet Take 1 tablet (8 mg total) by mouth every 8 (eight) hours as needed for nausea or vomiting. 20 tablet 1  . pantoprazole (PROTONIX) 40 MG tablet Take 1 tablet (40 mg total) by mouth daily. For stomach protection during chemotherapy 90 tablet 1  . pravastatin (PRAVACHOL) 40 MG tablet Take 1 tablet (40 mg total) by mouth daily. Please schedule an appointment for further refills. [phone]  90 tablet 3  . predniSONE (DELTASONE) 20 MG tablet Take 5 tablets (100 mg total) by mouth daily with breakfast for 5 days. Start taking on Day 1 of chemotherapy (same day as IV chemotherapy). 25 tablet 2  . prochlorperazine (COMPAZINE) 10 MG tablet Take 1 tablet (10 mg total) by mouth every 6 (six) hours as needed for nausea or vomiting. 30 tablet 1   No current facility-administered medications for this encounter.    Allergies  Allergen Reactions  . Dorzolamide Anaphylaxis and Dermatitis    Irritation on upper lids   . Acetazolamide Diarrhea    Lost weight also Lost weight also   . Bimatoprost Dermatitis and Other (See Comments)    Unknown Unknown   . Brimonidine Other (See Comments)    Unknown Unknown   . Sulfa Antibiotics   . Sulfamethoxazole Hives  . Dorzolamide Hcl-Timolol Mal Rash    itching itching   . Erythromycin Rash  . Penicillins Rash  . Sulfonamide Derivatives Rash      Social History   Socioeconomic History  . Marital status: Married    Spouse name: Not on file  . Number of children: Not on file  . Years of education: Not on file  . Highest education level: Not on file  Occupational History  . Not on file  Tobacco Use  . Smoking status: Former Smoker    Packs/day: 1.50    Years: 14.00    Pack years: 21.00    Types: Cigarettes    Quit date: 11/06/1969    Years since quitting: 49.8  . Smokeless tobacco: Never Used  Substance and Sexual Activity  . Alcohol use: Yes    Alcohol/week: 14.0 standard drinks    Types: 14 Glasses of wine per week    Comment: wine & beer - 3 glasses daily  . Drug use: No  . Sexual activity: Yes  Other Topics Concern  . Not on file  Social History Narrative  . Not on file   Social Determinants of Health   Financial Resource Strain:   . Difficulty of Paying Living Expenses: Not on file  Food Insecurity:   . Worried About Charity fundraiser in the Last Year: Not on file  . Ran Out of Food in the Last Year: Not  on file  Transportation Needs:   . Lack of Transportation (Medical): Not on file  . Lack of Transportation (Non-Medical): Not on file  Physical Activity:   . Days of Exercise per Week: Not on file  . Minutes of Exercise per Session: Not on file  Stress:   . Feeling of Stress : Not on file  Social Connections:   . Frequency of Communication with Friends and  Family: Not on file  . Frequency of Social Gatherings with Friends and Family: Not on file  . Attends Religious Services: Not on file  . Active Member of Clubs or Organizations: Not on file  . Attends Archivist Meetings: Not on file  . Marital Status: Not on file  Intimate Partner Violence:   . Fear of Current or Ex-Partner: Not on file  . Emotionally Abused: Not on file  . Physically Abused: Not on file  . Sexually Abused: Not on file      Family History  Problem Relation Age of Onset  . Cancer Mother   . Pneumonia Father   . Heart disease Brother        congen heart disease  . Birth defects Paternal Aunt        lung  . Colon cancer Neg Hx   . Stomach cancer Neg Hx     Vitals:   09/11/19 1030  BP: 134/66  Pulse: (!) 53  SpO2: 98%  Weight: 86.5 kg (190 lb 12.8 oz)    PHYSICAL EXAM: General:  Well appearing. No respiratory difficulty HEENT: normal  Neck: supple. no JVD. Carotids 2+ bilat; no bruits. No lymphadenopathy or thryomegaly appreciated. Cor: PMI nondisplaced. Irregular brady. No rubs, gallops or murmurs. Lungs: clear Abdomen: soft, nontender, nondistended. No hepatosplenomegaly. No bruits or masses. Good bowel sounds. Extremities: no cyanosis, clubbing, rash, edema Neuro: alert & oriented x 3, cranial nerves grossly intact x for blindnessmoves all 4 extremities w/o difficulty. Affect pleasant.  ECG: Slow AF 48 No ST-T wave abnormalities. QRS normal. Personally reviewed   ASSESSMENT & PLAN:  1. AF with slow VR - unknown duration - mildly symptomatic. NYHA II - Echo 09/04/19 EF 60% mod  bilateral atrial enlargement  (LA ~ 4.5cm) - CHADS-VASc 3 (HTN, age > 82) - suspect related to longstanding HTN and moderate bi-atrial enlargement. Denies ETOH. Will check sleep study. No symptoms or echo findings suggestive of ischemia - Ideally would start Eliquis 5 bid and plan DC-CV in 4 weeks to give him at least one shot at restoring NSR but given upcoming chemo may not be candidate for [facility]. I have reached out to Dr. Lorenso Courier to discuss - Will refer to Dr. Rayann Heman in [facility] for further thoughts and also to follow for possible PM in the future  2. Snoring - check sleep study  Glori Bickers, MD  1:25 PM

## 2019-09-14 ENCOUNTER — Encounter (HOSPITAL_COMMUNITY): Payer: Self-pay | Admitting: Hematology and Oncology

## 2019-09-14 ENCOUNTER — Inpatient Hospital Stay: Payer: Medicare HMO

## 2019-09-14 ENCOUNTER — Telehealth (HOSPITAL_COMMUNITY): Payer: Self-pay

## 2019-09-14 ENCOUNTER — Other Ambulatory Visit: Payer: Self-pay

## 2019-09-14 ENCOUNTER — Encounter: Payer: Self-pay | Admitting: Hematology and Oncology

## 2019-09-14 VITALS — BP 132/75 | HR 55 | Temp 97.6°F | Resp 18

## 2019-09-14 DIAGNOSIS — Z79899 Other long term (current) drug therapy: Secondary | ICD-10-CM | POA: Diagnosis not present

## 2019-09-14 DIAGNOSIS — Z95828 Presence of other vascular implants and grafts: Secondary | ICD-10-CM

## 2019-09-14 DIAGNOSIS — C8331 Diffuse large B-cell lymphoma, lymph nodes of head, face, and neck: Secondary | ICD-10-CM

## 2019-09-14 DIAGNOSIS — Z5111 Encounter for antineoplastic chemotherapy: Secondary | ICD-10-CM | POA: Diagnosis not present

## 2019-09-14 DIAGNOSIS — Z5112 Encounter for antineoplastic immunotherapy: Secondary | ICD-10-CM | POA: Diagnosis present

## 2019-09-14 DIAGNOSIS — Z5189 Encounter for other specified aftercare: Secondary | ICD-10-CM | POA: Diagnosis not present

## 2019-09-14 LAB — CBC WITH DIFFERENTIAL (CANCER CENTER ONLY)
Abs Immature Granulocytes: 0.04 10*3/uL (ref 0.00–0.07)
Basophils Absolute: 0.1 10*3/uL (ref 0.0–0.1)
Basophils Relative: 1 %
Eosinophils Absolute: 0.3 10*3/uL (ref 0.0–0.5)
Eosinophils Relative: 3 %
HCT: 41.3 % (ref 39.0–52.0)
Hemoglobin: 14.5 g/dL (ref 13.0–17.0)
Immature Granulocytes: 0 %
Lymphocytes Relative: 17 %
Lymphs Abs: 1.7 10*3/uL (ref 0.7–4.0)
MCH: 32.6 pg (ref 26.0–34.0)
MCHC: 35.1 g/dL (ref 30.0–36.0)
MCV: 92.8 fL (ref 80.0–100.0)
Monocytes Absolute: 0.9 10*3/uL (ref 0.1–1.0)
Monocytes Relative: 8 %
Neutro Abs: 7.3 10*3/uL (ref 1.7–7.7)
Neutrophils Relative %: 71 %
Platelet Count: 231 10*3/uL (ref 150–400)
RBC: 4.45 MIL/uL (ref 4.22–5.81)
RDW: 12.1 % (ref 11.5–15.5)
WBC Count: 10.3 10*3/uL (ref 4.0–10.5)
nRBC: 0 % (ref 0.0–0.2)

## 2019-09-14 LAB — CMP (CANCER CENTER ONLY)
ALT: 12 U/L (ref 0–44)
AST: 14 U/L — ABNORMAL LOW (ref 15–41)
Albumin: 3.7 g/dL (ref 3.5–5.0)
Alkaline Phosphatase: 67 U/L (ref 38–126)
Anion gap: 7 (ref 5–15)
BUN: 17 mg/dL (ref 8–23)
CO2: 28 mmol/L (ref 22–32)
Calcium: 9.2 mg/dL (ref 8.9–10.3)
Chloride: 104 mmol/L (ref 98–111)
Creatinine: 0.86 mg/dL (ref 0.61–1.24)
GFR, Est AFR Am: >60 mL/min (ref >60)
GFR, Estimated: >60 mL/min (ref >60)
Glucose, Bld: 124 mg/dL — ABNORMAL HIGH (ref 70–99)
Potassium: 3.8 mmol/L (ref 3.5–5.1)
Sodium: 139 mmol/L (ref 135–145)
Total Bilirubin: 0.6 mg/dL (ref 0.3–1.2)
Total Protein: 6.6 g/dL (ref 6.5–8.1)

## 2019-09-14 MED ORDER — ACETAMINOPHEN 325 MG PO TABS
650.0000 mg | ORAL_TABLET | Freq: Once | ORAL | Status: AC
  Administered 2019-09-14: 650 mg via ORAL

## 2019-09-14 MED ORDER — DIPHENHYDRAMINE HCL 25 MG PO CAPS
ORAL_CAPSULE | ORAL | Status: AC
  Filled 2019-09-14: qty 2

## 2019-09-14 MED ORDER — HEPARIN SOD (PORK) LOCK FLUSH 100 UNIT/ML IV SOLN
500.0000 [IU] | Freq: Once | INTRAVENOUS | Status: AC | PRN
  Administered 2019-09-14: 500 [IU]
  Filled 2019-09-14: qty 5

## 2019-09-14 MED ORDER — DEXAMETHASONE SODIUM PHOSPHATE 10 MG/ML IJ SOLN
INTRAMUSCULAR | Status: AC
  Filled 2019-09-14: qty 1

## 2019-09-14 MED ORDER — DIPHENHYDRAMINE HCL 25 MG PO CAPS
50.0000 mg | ORAL_CAPSULE | Freq: Once | ORAL | Status: AC
  Administered 2019-09-14: 50 mg via ORAL

## 2019-09-14 MED ORDER — DEXAMETHASONE SODIUM PHOSPHATE 10 MG/ML IJ SOLN
10.0000 mg | Freq: Once | INTRAMUSCULAR | Status: AC
  Administered 2019-09-14: 10 mg via INTRAVENOUS

## 2019-09-14 MED ORDER — SODIUM CHLORIDE 0.9 % IV SOLN
375.0000 mg/m2 | Freq: Once | INTRAVENOUS | Status: AC
  Administered 2019-09-14: 800 mg via INTRAVENOUS
  Filled 2019-09-14: qty 50

## 2019-09-14 MED ORDER — PALONOSETRON HCL INJECTION 0.25 MG/5ML
INTRAVENOUS | Status: AC
  Filled 2019-09-14: qty 5

## 2019-09-14 MED ORDER — SODIUM CHLORIDE 0.9 % IV SOLN
750.0000 mg/m2 | Freq: Once | INTRAVENOUS | Status: AC
  Administered 2019-09-14: 1540 mg via INTRAVENOUS
  Filled 2019-09-14: qty 77

## 2019-09-14 MED ORDER — SODIUM CHLORIDE 0.9 % IV SOLN
200.0000 mg | Freq: Once | INTRAVENOUS | Status: AC
  Administered 2019-09-14: 200 mg via INTRAVENOUS
  Filled 2019-09-14: qty 10

## 2019-09-14 MED ORDER — SODIUM CHLORIDE 0.9% FLUSH
10.0000 mL | INTRAVENOUS | Status: DC | PRN
  Administered 2019-09-14: 10 mL
  Filled 2019-09-14: qty 10

## 2019-09-14 MED ORDER — VINCRISTINE SULFATE CHEMO INJECTION 1 MG/ML
2.0000 mg | Freq: Once | INTRAVENOUS | Status: AC
  Administered 2019-09-14: 2 mg via INTRAVENOUS
  Filled 2019-09-14: qty 2

## 2019-09-14 MED ORDER — ACETAMINOPHEN 325 MG PO TABS
ORAL_TABLET | ORAL | Status: AC
  Filled 2019-09-14: qty 2

## 2019-09-14 MED ORDER — PALONOSETRON HCL INJECTION 0.25 MG/5ML
0.2500 mg | Freq: Once | INTRAVENOUS | Status: AC
  Administered 2019-09-14: 0.25 mg via INTRAVENOUS

## 2019-09-14 MED ORDER — SODIUM CHLORIDE 0.9 % IV SOLN
150.0000 mg | Freq: Once | INTRAVENOUS | Status: AC
  Administered 2019-09-14: 150 mg via INTRAVENOUS
  Filled 2019-09-14: qty 150

## 2019-09-14 MED ORDER — SODIUM CHLORIDE 0.9 % IV SOLN
Freq: Once | INTRAVENOUS | Status: AC
  Filled 2019-09-14: qty 250

## 2019-09-14 NOTE — Patient Instructions (Addendum)
[place] Discharge Instructions for Patients Receiving Chemotherapy  Today you received the following chemotherapy agent Doxorubicin; cyclophosphamide; Vincristine; Rituximab  To help prevent nausea and vomiting after your treatment, we encourage you to take your nausea medication as directed   If you develop nausea and vomiting that is not controlled by your nausea medication, call the clinic.   BELOW ARE SYMPTOMS THAT SHOULD BE REPORTED IMMEDIATELY:  *FEVER GREATER THAN 100.5 F  *CHILLS WITH OR WITHOUT FEVER  NAUSEA AND VOMITING THAT IS NOT CONTROLLED WITH YOUR NAUSEA MEDICATION  *UNUSUAL SHORTNESS OF BREATH  *UNUSUAL BRUISING OR BLEEDING  TENDERNESS IN MOUTH AND THROAT WITH OR WITHOUT PRESENCE OF ULCERS  *URINARY PROBLEMS  *BOWEL PROBLEMS  UNUSUAL RASH Items with * indicate a potential emergency and should be followed up as soon as possible.  Feel free to call the clinic should you have any questions or concerns. The clinic phone number is [phone].  Please show the [facility] at check-in to the Emergency Department and triage nurse.  Vincristine injection What is this medicine? VINCRISTINE (vin KRIS teen) is a chemotherapy drug. It slows the growth of cancer cells. This medicine is used to treat many types of cancer like Hodgkin's disease, leukemia, non-Hodgkin's lymphoma, neuroblastoma (brain cancer), rhabdomyosarcoma, and Wilms' tumor. This medicine may be used for other purposes; ask your health care provider or pharmacist if you have questions. COMMON BRAND NAME(S): Oncovin, Vincasar PFS What should I tell my health care provider before I take this medicine? They need to know if you have any of these conditions:  blood disorders  gout  infection (especially chickenpox, cold sores, or herpes)  kidney disease  liver disease  lung disease  nervous system disease like Charcot-Marie-Tooth (CMT)  recent or ongoing radiation  therapy  an unusual or allergic reaction to vincristine, other chemotherapy agents, other medicines, foods, dyes, or preservatives  pregnant or trying to get pregnant  breast-feeding How should I use this medicine? This drug is given as an infusion into a vein. It is administered in a hospital or clinic by a specially trained health care professional. If you have pain, swelling, burning, or any unusual feeling around the site of your injection, tell your health care professional right away. Talk to your pediatrician regarding the use of this medicine in children. While this drug may be prescribed for selected conditions, precautions do apply. Overdosage: If you think you have taken too much of this medicine contact a poison control center or emergency room at once. NOTE: This medicine is only for you. Do not share this medicine with others. What if I miss a dose? It is important not to miss your dose. Call your doctor or health care professional if you are unable to keep an appointment. What may interact with this medicine? Do not take this medicine with any of the following medications:  itraconazole  mibefradil  voriconazole This medicine may also interact with the following medications:  cyclosporine  erythromycin  fluconazole  ketoconazole  medicines for HIV like delavirdine, efavirenz, nevirapine  medicines for seizures like ethotoin, fosphenotoin, phenytoin  medicines to increase blood counts like filgrastim, pegfilgrastim, sargramostim  other chemotherapy drugs like cisplatin, L-asparaginase, methotrexate, mitomycin, paclitaxel  pegaspargase  vaccines  zalcitabine, ddC Talk to your doctor or health care professional before taking any of these medicines:  acetaminophen  aspirin  ibuprofen  ketoprofen  naproxen This list may not describe all possible interactions. Give your health care provider a list  of all the medicines, herbs, non-prescription drugs, or  dietary supplements you use. Also tell them if you smoke, drink alcohol, or use illegal drugs. Some items may interact with your medicine. What should I watch for while using this medicine? This drug may make you feel generally unwell. This is not uncommon, as chemotherapy can affect healthy cells as well as cancer cells. Report any side effects. Continue your course of treatment even though you feel ill unless your doctor tells you to stop. You may need blood work done while you are taking this medicine. This medicine will cause constipation. Try to have a bowel movement at least every 2 to 3 days. If you do not have a bowel movement for 3 days, call your doctor or health care professional. In some cases, you may be given additional medicines to help with side effects. Follow all directions for their use. Do not become pregnant while taking this medicine. Women should inform their doctor if they wish to become pregnant or think they might be pregnant. There is a potential for serious side effects to an unborn child. Talk to your health care professional or pharmacist for more information. Do not breast-feed an infant while taking this medicine. This medicine may make it more difficult to get pregnant or to father a child. Talk to your healthcare professional if you are concerned about your fertility. What side effects may I notice from receiving this medicine? Side effects that you should report to your doctor or health care professional as soon as possible:  allergic reactions like skin rash, itching or hives, swelling of the face, lips, or tongue  breathing problems  confusion or changes in emotions or moods  constipation  cough  mouth sores  muscle weakness  nausea and vomiting  pain, swelling, redness or irritation at the injection site  pain, tingling, numbness in the hands or feet  problems with balance, talking, walking  seizures  stomach pain  trouble passing urine or  change in the amount of urine Side effects that usually do not require medical attention (report to your doctor or health care professional if they continue or are bothersome):  diarrhea  hair loss  jaw pain  loss of appetite This list may not describe all possible side effects. Call your doctor for medical advice about side effects. You may report side effects to FDA at 1-800-FDA-1088. Where should I keep my medicine? This drug is given in a hospital or clinic and will not be stored at home. NOTE: This sheet is a summary. It may not cover all possible information. If you have questions about this medicine, talk to your doctor, pharmacist, or health care provider.  2020 Elsevier/Gold Standard (2018-03-28 08:55:02)   Cyclophosphamide Injection What is this medicine? CYCLOPHOSPHAMIDE (sye kloe FOSS fa mide) is a chemotherapy drug. It slows the growth of cancer cells. This medicine is used to treat many types of cancer like lymphoma, myeloma, leukemia, breast cancer, and ovarian cancer, to name a few. This medicine may be used for other purposes; ask your health care provider or pharmacist if you have questions. COMMON BRAND NAME(S): Cytoxan, Neosar What should I tell my health care provider before I take this medicine? They need to know if you have any of these conditions:  heart disease  history of irregular heartbeat  infection  kidney disease  liver disease  low blood counts, like white cells, platelets, or red blood cells  on hemodialysis  recent or ongoing radiation therapy  scarring or thickening of the lungs  trouble passing urine  an unusual or allergic reaction to cyclophosphamide, other medicines, foods, dyes, or preservatives  pregnant or trying to get pregnant  breast-feeding How should I use this medicine? This drug is usually given as an injection into a vein or muscle or by infusion into a vein. It is administered in a hospital or clinic by a specially  trained health care professional. Talk to your pediatrician regarding the use of this medicine in children. Special care may be needed. Overdosage: If you think you have taken too much of this medicine contact a poison control center or emergency room at once. NOTE: This medicine is only for you. Do not share this medicine with others. What if I miss a dose? It is important not to miss your dose. Call your doctor or health care professional if you are unable to keep an appointment. What may interact with this medicine?  amphotericin B  azathioprine  certain antivirals for HIV or hepatitis  certain medicines for blood pressure, heart disease, irregular heart beat  certain medicines that treat or prevent blood clots like warfarin  certain other medicines for cancer  cyclosporine  etanercept  indomethacin  medicines that relax muscles for surgery  medicines to increase blood counts  metronidazole This list may not describe all possible interactions. Give your health care provider a list of all the medicines, herbs, non-prescription drugs, or dietary supplements you use. Also tell them if you smoke, drink alcohol, or use illegal drugs. Some items may interact with your medicine. What should I watch for while using this medicine? Your condition will be monitored carefully while you are receiving this medicine. You may need blood work done while you are taking this medicine. Drink water or other fluids as directed. Urinate often, even at night. Some products may contain alcohol. Ask your health care professional if this medicine contains alcohol. Be sure to tell all health care professionals you are taking this medicine. Certain medicines, like metronidazole and disulfiram, can cause an unpleasant reaction when taken with alcohol. The reaction includes flushing, headache, nausea, vomiting, sweating, and increased thirst. The reaction can last from 30 minutes to several hours. Do not  become pregnant while taking this medicine or for 1 year after stopping it. Women should inform their health care professional if they wish to become pregnant or think they might be pregnant. Men should not father a child while taking this medicine and for 4 months after stopping it. There is potential for serious side effects to an unborn child. Talk to your health care professional for more information. Do not breast-feed an infant while taking this medicine or for 1 week after stopping it. This medicine has caused ovarian failure in some women. This medicine may make it more difficult to get pregnant. Talk to your health care professional if you are concerned about your fertility. This medicine has caused decreased sperm counts in some men. This may make it more difficult to father a child. Talk to your health care professional if you are concerned about your fertility. Call your health care professional for advice if you get a fever, chills, or sore throat, or other symptoms of a cold or flu. Do not treat yourself. This medicine decreases your body's ability to fight infections. Try to avoid being around people who are sick. Avoid taking medicines that contain aspirin, acetaminophen, ibuprofen, naproxen, or ketoprofen unless instructed by your health care professional. These medicines may hide a  fever. Talk to your health care professional about your risk of cancer. You may be more at risk for certain types of cancer if you take this medicine. If you are going to need surgery or other procedure, tell your health care professional that you are using this medicine. Be careful brushing or flossing your teeth or using a toothpick because you may get an infection or bleed more easily. If you have any dental work done, tell your dentist you are receiving this medicine. What side effects may I notice from receiving this medicine? Side effects that you should report to your doctor or health care professional  as soon as possible:  allergic reactions like skin rash, itching or hives, swelling of the face, lips, or tongue  breathing problems  nausea, vomiting  signs and symptoms of bleeding such as bloody or black, tarry stools; red or dark brown urine; spitting up blood or brown material that looks like coffee grounds; red spots on the skin; unusual bruising or bleeding from the eyes, gums, or nose  signs and symptoms of heart failure like fast, irregular heartbeat, sudden weight gain; swelling of the ankles, feet, hands  signs and symptoms of infection like fever; chills; cough; sore throat; pain or trouble passing urine  signs and symptoms of kidney injury like trouble passing urine or change in the amount of urine  signs and symptoms of liver injury like dark yellow or brown urine; general ill feeling or flu-like symptoms; light-colored stools; loss of appetite; nausea; right upper belly pain; unusually weak or tired; yellowing of the eyes or skin Side effects that usually do not require medical attention (report to your doctor or health care professional if they continue or are bothersome):  confusion  decreased hearing  diarrhea  facial flushing  hair loss  headache  loss of appetite  missed menstrual periods  signs and symptoms of low red blood cells or anemia such as unusually weak or tired; feeling faint or lightheaded; falls  skin discoloration This list may not describe all possible side effects. Call your doctor for medical advice about side effects. You may report side effects to FDA at 1-800-FDA-1088. Where should I keep my medicine? This drug is given in a hospital or clinic and will not be stored at home. NOTE: This sheet is a summary. It may not cover all possible information. If you have questions about this medicine, talk to your doctor, pharmacist, or health care provider.  2020 Elsevier/Gold Standard (2019-03-30 09:53:29)   Etoposide, VP-16 injection What  is this medicine? ETOPOSIDE, VP-16 (e toe POE side) is a chemotherapy drug. It is used to treat testicular cancer, lung cancer, and other cancers. This medicine may be used for other purposes; ask your health care provider or pharmacist if you have questions. COMMON BRAND NAME(S): Etopophos, Toposar, VePesid What should I tell my health care provider before I take this medicine? They need to know if you have any of these conditions:  infection  kidney disease  liver disease  low blood counts, like low white cell, platelet, or red cell counts  an unusual or allergic reaction to etoposide, other medicines, foods, dyes, or preservatives  pregnant or trying to get pregnant  breast-feeding How should I use this medicine? This medicine is for infusion into a vein. It is administered in a hospital or clinic by a specially trained health care professional. Talk to your pediatrician regarding the use of this medicine in children. Special care may be needed. Overdosage:  If you think you have taken too much of this medicine contact a poison control center or emergency room at once. NOTE: This medicine is only for you. Do not share this medicine with others. What if I miss a dose? It is important not to miss your dose. Call your doctor or health care professional if you are unable to keep an appointment. What may interact with this medicine? This medicine may interact with the following medications:  warfarin This list may not describe all possible interactions. Give your health care provider a list of all the medicines, herbs, non-prescription drugs, or dietary supplements you use. Also tell them if you smoke, drink alcohol, or use illegal drugs. Some items may interact with your medicine. What should I watch for while using this medicine? Visit your doctor for checks on your progress. This drug may make you feel generally unwell. This is not uncommon, as chemotherapy can affect healthy cells  as well as cancer cells. Report any side effects. Continue your course of treatment even though you feel ill unless your doctor tells you to stop. In some cases, you may be given additional medicines to help with side effects. Follow all directions for their use. Call your doctor or health care professional for advice if you get a fever, chills or sore throat, or other symptoms of a cold or flu. Do not treat yourself. This drug decreases your body's ability to fight infections. Try to avoid being around people who are sick. This medicine may increase your risk to bruise or bleed. Call your doctor or health care professional if you notice any unusual bleeding. Talk to your doctor about your risk of cancer. You may be more at risk for certain types of cancers if you take this medicine. Do not become pregnant while taking this medicine or for at least 6 months after stopping it. Women should inform their doctor if they wish to become pregnant or think they might be pregnant. Women of child-bearing potential will need to have a negative pregnancy test before starting this medicine. There is a potential for serious side effects to an unborn child. Talk to your health care professional or pharmacist for more information. Do not breast-feed an infant while taking this medicine. Men must use a latex condom during sexual contact with a woman while taking this medicine and for at least 4 months after stopping it. A latex condom is needed even if you have had a vasectomy. Contact your doctor right away if your partner becomes pregnant. Do not donate sperm while taking this medicine and for at least 4 months after you stop taking this medicine. Men should inform their doctors if they wish to father a child. This medicine may lower sperm counts. What side effects may I notice from receiving this medicine? Side effects that you should report to your doctor or health care professional as soon as possible:  allergic  reactions like skin rash, itching or hives, swelling of the face, lips, or tongue  low blood counts - this medicine may decrease the number of white blood cells, red blood cells, and platelets. You may be at increased risk for infections and bleeding  nausea, vomiting  redness, blistering, peeling or loosening of the skin, including inside the mouth  signs and symptoms of infection like fever; chills; cough; sore throat; pain or trouble passing urine  signs and symptoms of low red blood cells or anemia such as unusually weak or tired; feeling faint or lightheaded; falls;  breathing problems  unusual bruising or bleeding Side effects that usually do not require medical attention (report to your doctor or health care professional if they continue or are bothersome):  changes in taste  diarrhea  hair loss  loss of appetite  mouth sores This list may not describe all possible side effects. Call your doctor for medical advice about side effects. You may report side effects to FDA at 1-800-FDA-1088. Where should I keep my medicine? This drug is given in a hospital or clinic and will not be stored at home. NOTE: This sheet is a summary. It may not cover all possible information. If you have questions about this medicine, talk to your doctor, pharmacist, or health care provider.  2020 Elsevier/Gold Standard (2018-08-20 16:57:15)  Rituximab injection What is this medicine? RITUXIMAB (ri TUX i mab) is a monoclonal antibody. It is used to treat certain types of cancer like non-Hodgkin lymphoma and chronic lymphocytic leukemia. It is also used to treat rheumatoid arthritis, granulomatosis with polyangiitis (or Wegener's granulomatosis), microscopic polyangiitis, and pemphigus vulgaris. This medicine may be used for other purposes; ask your health care provider or pharmacist if you have questions. COMMON BRAND NAME(S): Rituxan, RUXIENCE What should I tell my health care provider before I take  this medicine? They need to know if you have any of these conditions:  heart disease  infection (especially a virus infection such as hepatitis B, chickenpox, cold sores, or herpes)  immune system problems  irregular heartbeat  kidney disease  low blood counts, like low white cell, platelet, or red cell counts  lung or breathing disease, like asthma  recently received or scheduled to receive a vaccine  an unusual or allergic reaction to rituximab, other medicines, foods, dyes, or preservatives  pregnant or trying to get pregnant  breast-feeding How should I use this medicine? This medicine is for infusion into a vein. It is administered in a hospital or clinic by a specially trained health care professional. A special MedGuide will be given to you by the pharmacist with each prescription and refill. Be sure to read this information carefully each time. Talk to your pediatrician regarding the use of this medicine in children. This medicine is not approved for use in children. Overdosage: If you think you have taken too much of this medicine contact a poison control center or emergency room at once. NOTE: This medicine is only for you. Do not share this medicine with others. What if I miss a dose? It is important not to miss a dose. Call your doctor or health care professional if you are unable to keep an appointment. What may interact with this medicine?  cisplatin  live virus vaccines This list may not describe all possible interactions. Give your health care provider a list of all the medicines, herbs, non-prescription drugs, or dietary supplements you use. Also tell them if you smoke, drink alcohol, or use illegal drugs. Some items may interact with your medicine. What should I watch for while using this medicine? Your condition will be monitored carefully while you are receiving this medicine. You may need blood work done while you are taking this medicine. This medicine  can cause serious allergic reactions. To reduce your risk you may need to take medicine before treatment with this medicine. Take your medicine as directed. In some patients, this medicine may cause a serious brain infection that may cause death. If you have any problems seeing, thinking, speaking, walking, or standing, tell your healthcare professional right  away. If you cannot reach your healthcare professional, urgently seek other source of medical care. Call your doctor or health care professional for advice if you get a fever, chills or sore throat, or other symptoms of a cold or flu. Do not treat yourself. This drug decreases your body's ability to fight infections. Try to avoid being around people who are sick. Do not become pregnant while taking this medicine or for at least 12 months after stopping it. Women should inform their doctor if they wish to become pregnant or think they might be pregnant. There is a potential for serious side effects to an unborn child. Talk to your health care professional or pharmacist for more information. Do not breast-feed an infant while taking this medicine or for at least 6 months after stopping it. What side effects may I notice from receiving this medicine? Side effects that you should report to your doctor or health care professional as soon as possible:  allergic reactions like skin rash, itching or hives; swelling of the face, lips, or tongue  breathing problems  chest pain  changes in vision  diarrhea  headache with fever, neck stiffness, sensitivity to light, nausea, or confusion  fast, irregular heartbeat  loss of memory  low blood counts - this medicine may decrease the number of white blood cells, red blood cells and platelets. You may be at increased risk for infections and bleeding.  mouth sores  problems with balance, talking, or walking  redness, blistering, peeling or loosening of the skin, including inside the mouth  signs of  infection - fever or chills, cough, sore throat, pain or difficulty passing urine  signs and symptoms of kidney injury like trouble passing urine or change in the amount of urine  signs and symptoms of liver injury like dark yellow or brown urine; general ill feeling or flu-like symptoms; light-colored stools; loss of appetite; nausea; right upper belly pain; unusually weak or tired; yellowing of the eyes or skin  signs and symptoms of low blood pressure like dizziness; feeling faint or lightheaded, falls; unusually weak or tired  stomach pain  swelling of the ankles, feet, hands  unusual bleeding or bruising  vomiting Side effects that usually do not require medical attention (report to your doctor or health care professional if they continue or are bothersome):  headache  joint pain  muscle cramps or muscle pain  nausea  tiredness This list may not describe all possible side effects. Call your doctor for medical advice about side effects. You may report side effects to FDA at 1-800-FDA-1088. Where should I keep my medicine? This drug is given in a hospital or clinic and will not be stored at home. NOTE: This sheet is a summary. It may not cover all possible information. If you have questions about this medicine, talk to your doctor, pharmacist, or health care provider.  2020 [employer] (2018-08-06 22:01:36)

## 2019-09-14 NOTE — Progress Notes (Signed)
Went to patient treatment area to introduce myself as Arboriculturist and to offer available resources.  Patient was asleep. Advised RN I left my card on the bedside table and information on the back if he is interested.

## 2019-09-14 NOTE — Telephone Encounter (Signed)
Order, OV note, stop bang and demographics all faxed to [facility] at [phone]  

## 2019-09-14 NOTE — Progress Notes (Addendum)
Reviewed Etoposide dosing with Dr. Irene Limbo with Dr. Lorenso Courier on PAL. Pt ordered Etoposide 100mg /m2 PO on Day 2 and 3. The equivalent IV dose on Day 1 would be 50mg /m2. Day 1 IV Etoposide ordered dose is 100mg /m2. Continue IV Day 1 Etoposide dose at 100mg /m2 per Dr. Irene Limbo and then clarify with Dr. Lorenso Courier upon return if he wants the PO etoposide dose on days 2 and 3 increased to 200mg /m2 (or day 1 Etoposide IV dose should be decreased to 50mg /m2 with Cycle 2 if PO dose is kept the same). Goal of treatment is curative intent.   Hardie Pulley, PharmD, BCPS, BCOP

## 2019-09-14 NOTE — Addendum Note (Signed)
Encounter addended by: Valeda Malm, RN on: 09/14/2019 11:00 AM  Actions taken: Clinical Note Signed

## 2019-09-15 ENCOUNTER — Telehealth: Payer: Self-pay | Admitting: *Deleted

## 2019-09-15 NOTE — Telephone Encounter (Addendum)
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  He states he is doing well but has some confusion with his medications & states he is not complaining but feels that he needed further explanations on 5 medications that were sent to him.  Discussed prednisone, protonix, & etoposide.  Prednisone & Etoposide do not show up on his med list & will therefore clarify with Dr Lorenso Courier.  Plan to call back later in day to discuss with wife who is napping now.  Found med instructions in medication history & called back & discussed instructions with Mr Lorenso Courier.  He states that his wife reread them & he has everything correct now.  He thought he was to take 1 prednisone daily but now knows to take 5 tabs daily x 5 days.  He took his other 4 pills with his lunch.  He took 4 capsules of his Etoposide this am & knows to take 4 capsules tomorrow. Informed to cont. prototonix daily to protect stomach.

## 2019-09-15 NOTE — Telephone Encounter (Signed)
-----   Message from Priscille Loveless, RN sent at 09/14/2019 10:00 AM EST ----- Regarding: First Chemo [facility] First Etoposide; Rituxin; Cytoxin; Vincristine follow-up

## 2019-09-16 LAB — SURGICAL PATHOLOGY

## 2019-09-17 ENCOUNTER — Telehealth: Payer: Self-pay | Admitting: *Deleted

## 2019-09-17 NOTE — Telephone Encounter (Signed)
TCT patient to follow up with him after his 1st treatment of chemo and to confirm understanding of medications. Spoke with Charles Li. He states he feels fairly well. C/o some gas pains but it is intermittent. He is feeling ok at this time. Voices understanding regarding  Oral medications and is taking them as directed-specifically the steroids and the oral etoposide.  Reviewed upcoming appts with him. Reminded him that he has labs on Monday with a port flush. Advised to put his EMLA cream on his port sight covered with Saran wrap before he leaves the house.  He voiced understanding. No further questions or concerns at this time.

## 2019-09-18 ENCOUNTER — Inpatient Hospital Stay: Payer: Medicare HMO

## 2019-09-18 ENCOUNTER — Other Ambulatory Visit: Payer: Self-pay

## 2019-09-18 VITALS — BP 132/70 | HR 58 | Temp 98.2°F | Resp 18

## 2019-09-18 DIAGNOSIS — Z5112 Encounter for antineoplastic immunotherapy: Secondary | ICD-10-CM | POA: Diagnosis not present

## 2019-09-18 DIAGNOSIS — C8331 Diffuse large B-cell lymphoma, lymph nodes of head, face, and neck: Secondary | ICD-10-CM

## 2019-09-18 MED ORDER — PEGFILGRASTIM-JMDB 6 MG/0.6ML ~~LOC~~ SOSY
6.0000 mg | PREFILLED_SYRINGE | Freq: Once | SUBCUTANEOUS | Status: AC
  Administered 2019-09-18: 6 mg via SUBCUTANEOUS

## 2019-09-18 MED ORDER — PEGFILGRASTIM-JMDB 6 MG/0.6ML ~~LOC~~ SOSY
PREFILLED_SYRINGE | SUBCUTANEOUS | Status: AC
  Filled 2019-09-18: qty 0.6

## 2019-09-18 NOTE — Patient Instructions (Signed)

## 2019-09-21 ENCOUNTER — Inpatient Hospital Stay (HOSPITAL_BASED_OUTPATIENT_CLINIC_OR_DEPARTMENT_OTHER): Payer: Medicare HMO | Admitting: Hematology and Oncology

## 2019-09-21 ENCOUNTER — Other Ambulatory Visit: Payer: Self-pay

## 2019-09-21 ENCOUNTER — Other Ambulatory Visit: Payer: Self-pay | Admitting: Hematology and Oncology

## 2019-09-21 ENCOUNTER — Inpatient Hospital Stay: Payer: Medicare HMO

## 2019-09-21 VITALS — BP 128/73 | HR 42 | Temp 97.8°F | Resp 18 | Ht 70.0 in | Wt 188.8 lb

## 2019-09-21 DIAGNOSIS — C8331 Diffuse large B-cell lymphoma, lymph nodes of head, face, and neck: Secondary | ICD-10-CM

## 2019-09-21 DIAGNOSIS — Z5112 Encounter for antineoplastic immunotherapy: Secondary | ICD-10-CM

## 2019-09-21 DIAGNOSIS — I482 Chronic atrial fibrillation, unspecified: Secondary | ICD-10-CM

## 2019-09-21 DIAGNOSIS — Z95828 Presence of other vascular implants and grafts: Secondary | ICD-10-CM

## 2019-09-21 LAB — CBC WITH DIFFERENTIAL (CANCER CENTER ONLY)
Abs Immature Granulocytes: 0.22 10*3/uL — ABNORMAL HIGH (ref 0.00–0.07)
Basophils Absolute: 0 10*3/uL (ref 0.0–0.1)
Basophils Relative: 1 %
Eosinophils Absolute: 0.1 10*3/uL (ref 0.0–0.5)
Eosinophils Relative: 3 %
HCT: 38 % — ABNORMAL LOW (ref 39.0–52.0)
Hemoglobin: 13.6 g/dL (ref 13.0–17.0)
Immature Granulocytes: 7 %
Lymphocytes Relative: 18 %
Lymphs Abs: 0.6 10*3/uL — ABNORMAL LOW (ref 0.7–4.0)
MCH: 32.5 pg (ref 26.0–34.0)
MCHC: 35.8 g/dL (ref 30.0–36.0)
MCV: 90.9 fL (ref 80.0–100.0)
Monocytes Absolute: 0.1 10*3/uL (ref 0.1–1.0)
Monocytes Relative: 2 %
Neutro Abs: 2.2 10*3/uL (ref 1.7–7.7)
Neutrophils Relative %: 69 %
Platelet Count: 72 10*3/uL — ABNORMAL LOW (ref 150–400)
RBC: 4.18 MIL/uL — ABNORMAL LOW (ref 4.22–5.81)
RDW: 11.9 % (ref 11.5–15.5)
WBC Count: 3.2 10*3/uL — ABNORMAL LOW (ref 4.0–10.5)
nRBC: 0 % (ref 0.0–0.2)

## 2019-09-21 LAB — CMP (CANCER CENTER ONLY)
ALT: 14 U/L (ref 0–44)
AST: 9 U/L — ABNORMAL LOW (ref 15–41)
Albumin: 3.3 g/dL — ABNORMAL LOW (ref 3.5–5.0)
Alkaline Phosphatase: 64 U/L (ref 38–126)
Anion gap: 9 (ref 5–15)
BUN: 25 mg/dL — ABNORMAL HIGH (ref 8–23)
CO2: 29 mmol/L (ref 22–32)
Calcium: 8.7 mg/dL — ABNORMAL LOW (ref 8.9–10.3)
Chloride: 101 mmol/L (ref 98–111)
Creatinine: 0.9 mg/dL (ref 0.61–1.24)
GFR, Est AFR Am: >60 mL/min (ref >60)
GFR, Estimated: >60 mL/min (ref >60)
Glucose, Bld: 116 mg/dL — ABNORMAL HIGH (ref 70–99)
Potassium: 3.7 mmol/L (ref 3.5–5.1)
Sodium: 139 mmol/L (ref 135–145)
Total Bilirubin: 0.9 mg/dL (ref 0.3–1.2)
Total Protein: 5.7 g/dL — ABNORMAL LOW (ref 6.5–8.1)

## 2019-09-21 LAB — MAGNESIUM: Magnesium: 1.7 mg/dL (ref 1.7–2.4)

## 2019-09-21 LAB — URIC ACID: Uric Acid, Serum: 4.4 mg/dL (ref 3.7–8.6)

## 2019-09-21 LAB — LACTATE DEHYDROGENASE: LDH: 159 U/L (ref 98–192)

## 2019-09-21 MED ORDER — SODIUM CHLORIDE 0.9% FLUSH
10.0000 mL | INTRAVENOUS | Status: DC | PRN
  Administered 2019-09-21: 10 mL
  Filled 2019-09-21: qty 10

## 2019-09-21 MED ORDER — HEPARIN SOD (PORK) LOCK FLUSH 100 UNIT/ML IV SOLN
500.0000 [IU] | Freq: Once | INTRAVENOUS | Status: AC | PRN
  Administered 2019-09-21: 500 [IU]
  Filled 2019-09-21: qty 5

## 2019-09-21 MED ORDER — PREDNISONE 20 MG PO TABS: 100.0000 mg | ORAL_TABLET | ORAL | 4 refills | Status: DC

## 2019-09-24 ENCOUNTER — Encounter: Payer: Self-pay | Admitting: Hematology and Oncology

## 2019-09-24 ENCOUNTER — Other Ambulatory Visit: Payer: Self-pay | Admitting: Hematology and Oncology

## 2019-09-24 NOTE — Progress Notes (Signed)
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 Lucia Gaskins for tonsillar mass present for a few weeks. 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 FISH studies pending.  2) 08/17/2019: establish care with Dr. Lorenso Courier   3) 08/28/2019: PET CT scan reveals activity in left lingual tonsil, level 2 lymph node, and level 3 lymph node. Consistent with Stage I 4) 09/04/2019: Bone marrow biopsy shows no evidence of lymphoma in the bone marrow.  5) 09/04/2019; TTE showed EF 60-65%, however patient found to have new atrial fibrillation.  6) 09/14/2019: started R-CEOP chemotherapy, Cycle 1 Day 1  HISTORY OF PRESENTING ILLNESS:  Charles Li 79 y.o. male with medical history significant for left tonsillar B cell lymphoma presents for f/u. He was last seen on 09/02/2019 in our clinic. In the interim he has had a bone marrow biopsy which showed no evidence of lymphoma in the bone marrow. Additionally he has started Cycle 1 of R-CEOP therapy on 09/14/2019.   On exam today Mr. Cominsky notes that he feels quite well.  He reports that he has not had any nausea, vomiting, or diarrhea since starting therapy.  He reports that he does have somewhat low energy starting after chemotherapy, but this resolved 2 days ago.  He does have generalized feelings of being weak, but is been fully ambulatory without any difficulty.  He does note that he has not had a bowel movement within the last 3 days, though he attributes this to not taking his senna docusate over that period of time.  The pain in the back of his mouth is near completely resolved and he has not required any oxycodone therapy.  For any pain that  he does have he has been taking ibuprofen.  He denies having any fevers, chills, sweats, shortness of breath, or chest pain.  A full 10 point ROS is listed below.  MEDICAL HISTORY:  Past Medical History:  Diagnosis Date  . Abdominal pain, epigastric 07/14/2009  . Abdominal wall hernia   . ALLERGIC RHINITIS 12/12/2006  . ASTHMA 11/28/2007  . 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 GLAUCOMA 12/12/2006  . 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 HERNIA, VENTRAL 01/04/2009  . HYPERLIPIDEMIA 11/28/2007  . HYPERTENSION 12/12/2006  . Leg swelling 04-24-12   occ., none at present  . RENAL CALCULUS 03/24/2010  . SYNCOPE 06/08/2008  . VENOUS INSUFFICIENCY 01/18/2010  . Visual disturbance 04-24-12   legally blind    ALLERGIES:  is allergic to dorzolamide; acetazolamide; bimatoprost; brimonidine; sulfa antibiotics; sulfamethoxazole; dorzolamide hcl-timolol mal; erythromycin; penicillins; and sulfonamide derivatives.  MEDICATIONS:  Current Outpatient Medications  Medication Sig Dispense Refill  . amLODipine (NORVASC) 5 MG tablet Take 1 tablet (5 mg total) by mouth daily. 90 tablet 3  . aspirin 81 MG tablet Take 81 mg by mouth at bedtime.    . benazepril-hydrochlorthiazide (LOTENSIN HCT) 20-12.5 MG tablet Take 1 tablet by mouth daily. 90 tablet 3  . desonide (DESOWEN) 0.05 % ointment Apply sparingly to lids as needed for dermatitis.    Marland Kitchen dorzolamide-timolol (COSOPT) 22.3-6.8 MG/ML ophthalmic solution Place 1 drop into both eyes 2 times daily.    Marland Kitchen ibuprofen (ADVIL) 200 MG tablet Take 200 mg by mouth every 6 (six) hours as  needed.    . Multiple Vitamin (MULTIVITAMIN) capsule Take 1 capsule by mouth daily.    . Netarsudil-Latanoprost (ROCKLATAN) 0.02-0.005 % SOLN Place 1 drop into both eyes daily.    . ondansetron (ZOFRAN) 8 MG tablet Take 1 tablet (8 mg total) by mouth every 8 (eight) hours as needed for nausea or vomiting. 20 tablet 1  . pantoprazole (PROTONIX) 40 MG tablet Take 1 tablet (40 mg total) by mouth daily. For  stomach protection during chemotherapy 90 tablet 1  . pravastatin (PRAVACHOL) 40 MG tablet Take 1 tablet (40 mg total) by mouth daily. Please schedule an appointment for further refills. [phone] 90 tablet 3  . [START ON 10/05/2019] predniSONE (DELTASONE) 20 MG tablet Take 5 tablets (100 mg total) by mouth as directed. Take 5 tablets per day on Days 1 through 5 of chemotherapy 25 tablet 4  . prochlorperazine (COMPAZINE) 10 MG tablet Take 1 tablet (10 mg total) by mouth every 6 (six) hours as needed for nausea or vomiting. 30 tablet 1   No current facility-administered medications for this visit.    REVIEW OF SYSTEMS:   Constitutional: ( - ) fevers, ( - )  chills , ( - ) night sweats Eyes: ( - ) blurriness of vision, ( - ) double vision, ( - ) watery eyes Ears, nose, mouth, throat, and face: ( - ) mucositis, ( + ) sore throat Respiratory: ( - ) cough, ( - ) dyspnea, ( - ) wheezes Cardiovascular: ( - ) palpitation, ( - ) chest discomfort, ( - ) lower extremity swelling Gastrointestinal:  ( - ) nausea, ( - ) heartburn, ( - ) change in bowel habits Skin: ( - ) abnormal skin rashes Lymphatics: ( + ) new lymphadenopathy (post auricular), ( - ) easy bruising Neurological: ( - ) numbness, ( - ) tingling, ( - ) new weaknesses Behavioral/Psych: ( - ) mood change, ( - ) new changes  All other systems were reviewed with the patient and are negative.  PHYSICAL EXAMINATION: ECOG PERFORMANCE STATUS: 1 - Symptomatic but completely ambulatory  Vitals:   09/21/19 1318  BP: 128/73  Pulse: (!) 42  Resp: 18  Temp: 97.8 F (36.6 C)  SpO2: 100%   Filed Weights   09/21/19 1318  Weight: 188 lb 12.8 oz (85.6 kg)    GENERAL: well appearing elderly Caucasian male in NAD  SKIN: skin color, texture, turgor are normal, no rashes or significant lesions EYES: conjunctiva are pink and non-injected, sclera clear OROPHARYNX: lesion in the left posterior of the mouth with no erythema and no exudate LYMPH:   no palpable lymphadenopathy in the cervical, axillary or supraclavicular region.   LUNGS: clear to auscultation and percussion with normal breathing effort HEART: regular rate & rhythm and no murmurs and no lower extremity edema Musculoskeletal: no cyanosis of digits and no clubbing  PSYCH: alert & oriented x 3, fluent speech NEURO: no focal motor/sensory deficits  LABORATORY DATA:  I have reviewed the data as listed CBC Latest Ref Rng & Units 09/21/2019 09/14/2019 09/04/2019  WBC 4.0 - 10.5 K/uL 3.2(L) 10.3 8.6  Hemoglobin 13.0 - 17.0 g/dL 13.6 14.5 14.2  Hematocrit 39.0 - 52.0 % 38.0(L) 41.3 41.2  Platelets 150 - 400 K/uL 72(L) 231 263    CMP Latest Ref Rng & Units 09/21/2019 09/14/2019 08/17/2019  Glucose 70 - 99 mg/dL 116(H) 124(H) 100(H)  BUN 8 - 23 mg/dL 25(H) 17 17  Creatinine 0.61 - 1.24 mg/dL 0.90 0.86 0.98  Sodium 135 - 145 mmol/L 139 139 142  Potassium 3.5 - 5.1 mmol/L 3.7 3.8 3.6  Chloride 98 - 111 mmol/L 101 104 104  CO2 22 - 32 mmol/L 29 28 30   Calcium 8.9 - 10.3 mg/dL 8.7(L) 9.2 9.4  Total Protein 6.5 - 8.1 g/dL 5.7(L) 6.6 7.0  Total Bilirubin 0.3 - 1.2 mg/dL 0.9 0.6 0.7  Alkaline Phos 38 - 126 U/L 64 67 59  AST 15 - 41 U/L 9(L) 14(L) 12(L)  ALT 0 - 44 U/L 14 12 12     PATHOLOGY:  SURGICAL PATHOLOGY  CASE: [id number]  PATIENT: [facility]  Surgical Pathology Report   Clinical History: tonsil cancer (cm)   FINAL MICROSCOPIC DIAGNOSIS:   A. TONSIL, LEFT, BIOPSY:  - Large B-cell lymphoma  - See comment   COMMENT:  The tonsillar tissue has an ulcerated surface with underlying atypical  cellular proliferation. By immunohistochemistry, the neoplastic cells  are positive for CD20, CD10, BCL6 and BCL2 (weak) but negative for CD3,  CD56, CD5, Mum-1, cytokeratin AE1/3 and EBV by in situ hybridization.  The proliferative rate by Ki-67 is 70 to 80%. Overall, the features are  consistent with a large B-cell lymphoma. The differential diagnosis  includes  diffuse large B-cell lymphoma and high-grade B-cell lymphoma  (double hit). FISH is pending and will be reported in an addendum.   Preliminary results of this case were given to Dr. Lucia Gaskins on August 14, 2019.   Dr. Tresa Moore reviewed the case and agrees with the above diagnosis.   GROSS DESCRIPTION:   The specimen is received in formalin and consists of a 0.9 x 0.4 x 0.4  cm piece of tan soft tissue. The specimen is entirely submitted in 1  cassette. Craig Staggers 08/14/2019)   Final Diagnosis performed by Thressa Sheller, MD.  Electronically signed  08/14/2019  Technical and / or Professional components performed at [facility]. [facility], [place] [address] [place], [place] [address].  Immunohistochemistry Technical component (if applicable) was performed  at [facility]. [address], [place],  [place], [place] [address].  IMMUNOHISTOCHEMISTRY DISCLAIMER (if applicable):  Some of these immunohistochemical stains may have been developed and the  performance characteristics determine by [facility]. Some  may not have been cleared or approved by the U.S. Food and Drug  Administration. The FDA has determined that such clearance or approval  is not necessary. This test is used for clinical purposes. It should not  be regarded as investigational or for research. This laboratory is  certified under the [place]  (CLIA-88) as qualified to perform high complexity clinical laboratory  testing. The controls stained appropriately.  SURGICAL PATHOLOGY  ** THIS IS AN ADDENDUM REPORT **  CASE: [id number]  PATIENT: Ormand Figgs  Bone Marrow Report  **Addendum **   Reason for Addendum #1: Cytogenetics results   Clinical History: lymphoma, DLBCL, left posterior iliac, (ADC)    DIAGNOSIS:   BONE MARROW, ASPIRATE, CLOT, CORE:  - Normocellular marrow with trilineage hematopoiesis  - No lymphoma identified    PERIPHERAL BLOOD:  - Morphologically unremarkable  - See complete blood cell count   MICROSCOPIC DESCRIPTION:   PERIPHERAL BLOOD SMEAR: The peripheral blood is morphologically  unremarkable.   BONE MARROW ASPIRATE: Spicular, cellular and adequate for evaluation  Erythroid precursors: Orderly maturation without overt dysplasia  Granulocytic precursors: Orderly maturation without overt dysplasia  Megakaryocytes: Qualitatively and quantitatively unremarkable  Lymphocytes/plasma cells: No lymphocytosis or  plasmacytosis   TOUCH PREPARATIONS: Hypocellular with no additional findings ascompared  to the aspirate smears.   CLOT AND BIOPSY: Examination of the core biopsy and clot section reveals  a normocellular bone marrow (30-40%) with trilineage hematopoiesis.  Myeloid and erythroid elements are present in essentially normal  proportions. Megakaryocytes display a spectrum of maturation without  clustering. There are no granulomas identified. A single well-formed  lymphoid aggregate is noted on the clot section. By  immunohistochemistry, is composed of an admixture of B and T cells  (CD20, CD3) without aberrant B-cell expression of CD5 or CD10.   IRON STAIN: Iron stains are performed on a bone marrow aspirate or touch  imprint smear and section of clot. The controls stained appropriately.     Storage Iron: Present    Ring Sideroblasts: Not identified   ADDITIONAL DATA/TESTING: Cytogenetics is pending and will be reported  separately. Flow cytometry performed on the bone marrow biopsy did not  identify a monoclonal B-cell or phenotypically aberrant T-cell  population (See [id number]).   CELL COUNT DATA:   Bone Marrow count performed on 500 cells shows:  Blasts:  0%  Myeloid: 57%  Promyelocytes: 0%  Erythroid:   28%  Myelocytes:  10% Lymphocytes:  7%  Metamyelocytes:   2%  Plasma cells: 8%  Bands:  7%  Neutrophils:  33% M:E ratio:   2.03   Eosinophils:  5%  Basophils:   0%  Monocytes:   0%   Lab Data: CBC performed on 09/04/2019 shows:  WBC: 8.6 k/uL Neutrophils:  63%  Hgb: 14.2 g/dL Lymphocytes:  30%  HCT: 41.2 %  Monocytes:   3%  MCV: 94.7 fL  Eosinophils:  4%  RDW: 11.8 %  Basophils:   0%  PLT: 263 k/uL   GROSS DESCRIPTION:   A: Aspirate smear   B: Received in B-plus fixative are tissue fragments measuring 2.1 x 1.0  x 0.3 cm in aggregate. The specimen is submitted in total.   C: Received in B-plus fixative is a 2.0 x 0.2 cm core of bone which is  submitted in toto fine decalcification. [facility] 09/04/2019)    Final Diagnosis performed by Thressa Sheller, MD.  Electronically signed  09/08/2019  Technical component performed at [place]  [address], [place], [place] [address].  Professional component performed at [employer]. [facility],  [place] [address] [place], [place] [address].  Immunohistochemistry Technical component (if applicable) was performed  at [facility]. [address] [place],  [place], [place] [address].  IMMUNOHISTOCHEMISTRY DISCLAIMER (if applicable):  Some of these immunohistochemical stains may have been developed and the  performance characteristics determine by [facility]. Some  may not have been cleared or approved by the U.S. Food and Drug  Administration. The FDA has determined that such clearance or approval  is not necessary. This test is used for clinical purposes. It should not  be regarded as investigational or for research. This laboratory is  certified under the [place]  (CLIA-88) as qualified to perform high complexity clinical laboratory  testing. The controls stained appropriately.   ADDENDUM:   ** Please note this testing was performed and interpreted by an outside  facility. This addendum is only being added to provide a summary of the   results for report completeness. Please see electronic medical record  for a copy of the full report. **   Karyotype: 47,?XY,?del(?9)?(?q13q22)?,?+?mar[?3]?/?46,?XY[?20]?  Interpretation: ABNORMAL MALE KARYOTYPE  Cytogenetic analysis shows an abnormal male karyotype. 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 The remaining twenty cells show a normal  karyotype.   Deletion of chromosome 9q occurs in both myeloid (more common) and  lymphoid disorders, including acute myeloid leukemia (AML) (more  commonly) and myelodysplastic syndrome (MDS) (only rarely). In MDS the  prognostic significance would be expected to be intermediate.  In AML  the prognosis is variable. Marker chromosomes and structural  chromosomal aberrations in the absence of autosomal monosomies have  minimal prognostic impact. Correlation with other clinical and  laboratory findings is indicated.    RADIOGRAPHIC STUDIES: I have personally reviewed the imaging study below and I agree with the findings: hypermetabolic lymph nodes on left side of neck and tonsil. No disease evidence elsewhere in the body.   NM PET Image Initial (PI) Skull Base To Thigh  Result Date: 08/28/2019 CLINICAL DATA:  Initial treatment strategy for large B-cell lymphoma. EXAM: NUCLEAR MEDICINE PET SKULL BASE TO THIGH TECHNIQUE: Ten mCi F-18 FDG was injected intravenously. Full-ring PET imaging was performed from the skull base to thigh after the radiotracer. CT data was obtained and used for attenuation correction and anatomic localization. Fasting blood glucose: 117 mg/dl COMPARISON:  Neck CT 08/11/2019 FINDINGS: Mediastinal blood pool activity: SUV max 2.7 Liver activity: SUV max 3.3 NECK: Intense hypermetabolic activity localizing to the LEFT lingual tonsil with SUV max equal 23.7. Enlarged hypermetabolic LEFT level 2 lymph node measures 18 mm short axis with SUV max equal  44.6. Smaller hypermetabolic LEFT level III lymph node measures 5 mm (image 38/4) with SUV max equal 3.6 Incidental CT findings: none CHEST: No hypermetabolic mediastinal lymph nodes. Hypermetabolic supraclavicular nodes. No hypermetabolic axillary nodes. No enlarged lymph nodes. Within the superior segment LEFT lower lobe, lobular nodule measuring 9 mm (image 80/4). This pulmonary nodule has mild to moderate metabolic activity SUV max equal 3.0 Incidental CT findings: none ABDOMEN/PELVIS: No abnormal hypermetabolic activity within the liver, pancreas, adrenal glands, or spleen. No hypermetabolic lymph nodes in the abdomen or pelvis. Spleen is normal volume and normal metabolic activity. Incidental CT findings: Severe RIGHT hydronephrosis related to concerning calculus in the mid RIGHT ureter. Calculus measures 7 mm on image 150/4. There is renal cortical thinning on the RIGHT consistent with chronic hydronephrosis. SKELETON: No focal hypermetabolic activity to suggest skeletal metastasis. Incidental CT findings: none IMPRESSION: 1. Intense hypermetabolic activity associated with the LEFT lingual tonsil consistent with high-grade lymphoma. 2. Enlarged hypermetabolic LEFT level 2 lymph node consistent with high-grade lymphoma. 3. Smaller LEFT level 3 lymph node is hypermetabolic and concerning for lymphoma involvement 4. Small moderately metabolic LEFT lobe nodule is indeterminate. Recommend attention on post therapy scans. 5. Severe chronic RIGHT hydronephrosis secondary to obstructing calculus in the mid RIGHT ureter. Electronically Signed   By: Suzy Bouchard M.D.   On: 08/28/2019 16:41   ECHOCARDIOGRAM COMPLETE  Result Date: 09/04/2019    ECHOCARDIOGRAM REPORT   Patient Name:   Charles Li Knoll Date of Exam: 09/04/2019 Medical Rec #:  [id number]        Height:       70.0 in Accession #:    [id number]       Weight:       190.1 lb Date of Birth:  1941-03-27        BSA:          2.043 m Patient Age:  78 years          BP:           166/94 mmHg Patient Gender: M                HR:           48 bpm. Exam Location:  Inpatient Procedure: 2D Echo, Cardiac Doppler, Color Doppler and Strain Analysis Indications:    Z51.11 Encounter for antineoplastic chemotheraphy  History:        Patient has prior history of Echocardiogram examinations, most                 recent 06/08/2008. Abnormal ECG, Signs/Symptoms:Syncope; Risk                 Factors:Hypertension and Dyslipidemia. Lymphoma. Edema.                 Bradycardia.  Sonographer:    Roseanna Rainbow Referring Phys: [id number] Ledell Peoples IV  Sonographer Comments: It appears the patient has an abnormal rhythm during this exam. Patient is legally blind. IMPRESSIONS  1. Pt in atrial fibrillation during the study; normal LV function; mild LVH; mild biatrial enlargement; trace AI.  2. Left ventricular ejection fraction, by estimation, is 60 to 65%. The left ventricle has normal function. The left ventricle has no regional wall motion abnormalities. There is mild left ventricular hypertrophy. Left ventricular diastolic function could not be evaluated. Left ventricular diastolic function could not be evaluated.  3. Right ventricular systolic function is normal. The right ventricular size is normal. There is normal pulmonary artery systolic pressure.  4. Left atrial size was mildly dilated.  5. Right atrial size was mildly dilated.  6. The mitral valve is normal in structure and function. Trivial mitral valve regurgitation. No evidence of mitral stenosis.  7. The aortic valve is tricuspid. Aortic valve regurgitation is trivial. Mild aortic valve sclerosis is present, with no evidence of aortic valve stenosis.  8. The inferior vena cava is normal in size with greater than 50% respiratory variability, suggesting right atrial pressure of 3 mmHg. FINDINGS  Left Ventricle: Left ventricular ejection fraction, by estimation, is 60 to 65%. The left ventricle has normal function. The left ventricle has  no regional wall motion abnormalities. The left ventricular internal cavity size was normal in size. There is  mild left ventricular hypertrophy. Left ventricular diastolic function could not be evaluated due to atrial fibrillation. Left ventricular diastolic function could not be evaluated. Right Ventricle: The right ventricular size is normal.Right ventricular systolic function is normal. There is normal pulmonary artery systolic pressure. The tricuspid regurgitant velocity is 2.40 m/s, and with an assumed right atrial pressure of 3 mmHg, the estimated right ventricular systolic pressure is 46.8 mmHg. Left Atrium: Left atrial size was mildly dilated. Right Atrium: Right atrial size was mildly dilated. Pericardium: There is no evidence of pericardial effusion. Mitral Valve: The mitral valve is normal in structure and function. Normal mobility of the mitral valve leaflets. Mild mitral annular calcification. Trivial mitral valve regurgitation. No evidence of mitral valve stenosis. Tricuspid Valve: The tricuspid valve is normal in structure. Tricuspid valve regurgitation is trivial. No evidence of tricuspid stenosis. Aortic Valve: The aortic valve is tricuspid. Aortic valve regurgitation is trivial. Mild aortic valve sclerosis is present, with no evidence of aortic valve stenosis. Pulmonic Valve: The pulmonic valve was not well visualized. Pulmonic valve regurgitation is trivial. No evidence of pulmonic stenosis. Aorta: The aortic root is normal in  size and structure. Venous: The inferior vena cava is normal in size with greater than 50% respiratory variability, suggesting right atrial pressure of 3 mmHg. IAS/Shunts: No atrial level shunt detected by color flow Doppler. Additional Comments: Pt in atrial fibrillation during the study; normal LV function; mild LVH; mild biatrial enlargement; trace AI.  LEFT VENTRICLE PLAX 2D LVIDd:         4.50 cm LVIDs:         2.90 cm LV PW:         1.50 cm LV IVS:        1.30 cm LVOT  diam:     2.10 cm LV SV:         70 LV SV Index:   34 LVOT Area:     3.46 cm  LV Volumes (MOD) LV vol d, MOD A2C: 88.4 ml LV vol d, MOD A4C: 101.0 ml LV vol s, MOD A2C: 43.3 ml LV vol s, MOD A4C: 31.1 ml LV SV MOD A2C:     45.1 ml LV SV MOD A4C:     101.0 ml LV SV MOD BP:      62.4 ml RIGHT VENTRICLE         IVC TAPSE (M-mode): 2.7 cm  IVC diam: 2.10 cm LEFT ATRIUM             Index       RIGHT ATRIUM           Index LA diam:        4.40 cm 2.15 cm/m  RA Area:     23.70 cm LA Vol (A2C):   63.1 ml 30.89 ml/m RA Volume:   80.00 ml  39.16 ml/m LA Vol (A4C):   62.4 ml 30.55 ml/m LA Biplane Vol: 66.3 ml 32.46 ml/m  AORTIC VALVE LVOT Vmax:   93.80 cm/s LVOT Vmean:  64.900 cm/s LVOT VTI:    0.202 m  AORTA Ao Root diam: 3.00 cm Ao Asc diam:  3.70 cm MITRAL VALVE                TRICUSPID VALVE MV Area (PHT): 4.27 cm     TR Peak grad:   23.0 mmHg MV Decel Time: 178 msec     TR Vmax:        240.00 cm/s MV E velocity: 102.50 cm/s                             SHUNTS                             Systemic VTI:  0.20 m                             Systemic Diam: 2.10 cm Kirk Ruths MD Electronically signed by Kirk Ruths MD Signature Date/Time: 09/04/2019/11:17:52 AM    Final    IR IMAGING GUIDED PORT INSERTION  Result Date: 09/07/2019 INDICATION: History of lymphoma. In need of durable intravenous access for chemotherapy administration. EXAM: IMPLANTED PORT A CATH PLACEMENT WITH ULTRASOUND AND FLUOROSCOPIC GUIDANCE COMPARISON:  PET-CT - 08/28/2019 MEDICATIONS: Vancomycin 1 gm IV; The antibiotic was administered within an appropriate time interval prior to skin puncture. ANESTHESIA/SEDATION: Moderate (conscious) sedation was employed during this procedure. A total of Versed 2 mg and Fentanyl 100 mcg was administered intravenously. Moderate Sedation Time:  23 minutes. The patient's level of consciousness and vital signs were monitored continuously by radiology nursing throughout the procedure under my direct supervision.  CONTRAST:  None FLUOROSCOPY TIME:  18 seconds (6 mGy) COMPLICATIONS: None immediate. PROCEDURE: The procedure, risks, benefits, and alternatives were explained to the patient. Questions regarding the procedure were encouraged and answered. The patient understands and consents to the procedure. The right neck and chest were prepped with chlorhexidine in a sterile fashion, and a sterile drape was applied covering the operative field. Maximum barrier sterile technique with sterile gowns and gloves were used for the procedure. A timeout was performed prior to the initiation of the procedure. Local anesthesia was provided with 1% lidocaine with epinephrine. After creating a small venotomy incision, a micropuncture kit was utilized to access the internal jugular vein. Real-time ultrasound guidance was utilized for vascular access including the acquisition of a permanent ultrasound image documenting patency of the accessed vessel. The microwire was utilized to measure appropriate catheter length. A subcutaneous port pocket was then created along the upper chest wall utilizing a combination of sharp and blunt dissection. The pocket was irrigated with sterile saline. A single lumen "Slim" sized power injectable port was chosen for placement. The 8 Fr catheter was tunneled from the port pocket site to the venotomy incision. The port was placed in the pocket. The external catheter was trimmed to appropriate length. At the venotomy, an 8 Fr peel-away sheath was placed over a guidewire under fluoroscopic guidance. The catheter was then placed through the sheath and the sheath was removed. Final catheter positioning was confirmed and documented with a fluoroscopic spot radiograph. The port was accessed with a Huber needle, aspirated and flushed with heparinized saline. The venotomy site was closed with an interrupted 4-0 Vicryl suture. The port pocket incision was closed with interrupted 2-0 Vicryl suture and the skin was opposed  with a running subcuticular 4-0 Vicryl suture. Dermabond and Steri-strips were applied to both incisions. Dressings were placed. The patient tolerated the procedure well without immediate post procedural complication. FINDINGS: After catheter placement, the tip lies within the superior cavoatrial junction. The catheter aspirates and flushes normally and is ready for immediate use. IMPRESSION: Successful placement of a right internal jugular approach power injectable Port-A-Cath. The catheter is ready for immediate use. Electronically Signed   By: Sandi Mariscal M.D.   On: 09/07/2019 15:48    [facility]ICE MILLIRON 79 y.o. 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   In the interim since his last visit the patient has started cycle 1 of R-CEOP on 09/14/2019.  So far the patient is tolerating the treatment well with no adverse side effects.  He denies having any nausea, vomiting, or diarrhea.  He does note that he has had a decrease in energy as of feelings of weakness, but no focal symptoms.  The treatment plan is for Rituximab 375 mg/m2, Cyclophosphamide 717m/m2, etoposide IV 542mm2 (followed by 10052m2 PO on Day 2 and 3), and Vincristine 1.4 mg/m2 all on Day 1. The patient will also received PO prednisone 100m75my 1-5 of each 3 week cycle. Will tentatively plan for 3 cycles followed by ISRT vs 4-6 cycles if no ISRT.   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 Bone marrow biopsy confirmed no alternative sites of lymphoma.  -- started R-CEOP  chemotherapy on 09/14/2019. This is therapy with curative intent. Etoposide was substituted for anthracycline therapy due to his atrial fibrillation found on TTE.  -- At this time his findings are consistent with a Stage I. Plan is for 3 cycles of R-CEOP with ISRT to site of disease assuming  interval PET scan shows no active disease. Will refer to Rad/Onc after the start of the next cycle.  --plan to have patient RTC 10/05/2019 prior to the start of Cycle 2 chemotherapy.   #Oropharynx Pain, improved --continue taking ibuprofen PRN --patient has oxycodone 5-81m q6H PRN available for throat pain, though he has not needed this --no improvement viscous lidocaine, patient noted that regular mouthwash provided more pain relief.  --provided patient with senna-docusate to assure these medications do not cause him constipation. --continue to monitor  #Symptom Management --will assure patient has zofran 820mPO q8H PRN and compazine 2591m6H PRN. No nausea/vomiting yet --pain medications as noted above for throat pain --will continue to monitor   #Constipation --patient notes no BM in 3 days. Recommend continued senna docusate with Magnesium citrate OTC to move bowels  #Atrial Fibrillation, new --currently working with cardiology for recommendations regarding anticoagulation --anticoagulation therapy OK as long as patient Plts >50k   No orders of the defined types were placed in this encounter.   All questions were answered. The patient knows to call the clinic with any problems, questions or concerns.  A total of more than 40 minutes were spent on this encounter and over half of that time was spent on counseling and coordination of care as outlined above.   JohLedell PeoplesD Department of Hematology/Oncology [facility]: [phone]: [phone]: [email or web address]  09/24/2019 9:47 AM

## 2019-09-30 DIAGNOSIS — R69 Illness, unspecified: Secondary | ICD-10-CM | POA: Diagnosis not present

## 2019-09-30 MED FILL — ETOPOSIDE 50 MG CAPSULE: 50 | 4 days supply | Qty: 8 | Fill #0

## 2019-09-30 MED FILL — predniSONE 20 MG TABS: 20 | 5 days supply | Qty: 25 | Fill #1

## 2019-10-05 ENCOUNTER — Inpatient Hospital Stay: Payer: Medicare HMO

## 2019-10-05 ENCOUNTER — Other Ambulatory Visit: Payer: Self-pay | Admitting: *Deleted

## 2019-10-05 ENCOUNTER — Inpatient Hospital Stay (HOSPITAL_BASED_OUTPATIENT_CLINIC_OR_DEPARTMENT_OTHER): Payer: Medicare HMO | Admitting: Hematology and Oncology

## 2019-10-05 ENCOUNTER — Other Ambulatory Visit: Payer: Self-pay | Admitting: Hematology and Oncology

## 2019-10-05 ENCOUNTER — Other Ambulatory Visit: Payer: Self-pay

## 2019-10-05 VITALS — BP 108/70 | HR 51 | Temp 97.5°F | Resp 16 | Wt 187.5 lb

## 2019-10-05 DIAGNOSIS — C8331 Diffuse large B-cell lymphoma, lymph nodes of head, face, and neck: Secondary | ICD-10-CM | POA: Diagnosis not present

## 2019-10-05 DIAGNOSIS — Z5112 Encounter for antineoplastic immunotherapy: Secondary | ICD-10-CM

## 2019-10-05 DIAGNOSIS — N132 Hydronephrosis with renal and ureteral calculous obstruction: Secondary | ICD-10-CM

## 2019-10-05 DIAGNOSIS — Z95828 Presence of other vascular implants and grafts: Secondary | ICD-10-CM

## 2019-10-05 LAB — CBC WITH DIFFERENTIAL (CANCER CENTER ONLY)
Abs Immature Granulocytes: 0.23 10*3/uL — ABNORMAL HIGH (ref 0.00–0.07)
Basophils Absolute: 0.1 10*3/uL (ref 0.0–0.1)
Basophils Relative: 1 %
Eosinophils Absolute: 0.1 10*3/uL (ref 0.0–0.5)
Eosinophils Relative: 1 %
HCT: 37.2 % — ABNORMAL LOW (ref 39.0–52.0)
Hemoglobin: 12.9 g/dL — ABNORMAL LOW (ref 13.0–17.0)
Immature Granulocytes: 2 %
Lymphocytes Relative: 8 %
Lymphs Abs: 0.9 10*3/uL (ref 0.7–4.0)
MCH: 32.4 pg (ref 26.0–34.0)
MCHC: 34.7 g/dL (ref 30.0–36.0)
MCV: 93.5 fL (ref 80.0–100.0)
Monocytes Absolute: 0.6 10*3/uL (ref 0.1–1.0)
Monocytes Relative: 5 %
Neutro Abs: 9.2 10*3/uL — ABNORMAL HIGH (ref 1.7–7.7)
Neutrophils Relative %: 83 %
Platelet Count: 339 10*3/uL (ref 150–400)
RBC: 3.98 MIL/uL — ABNORMAL LOW (ref 4.22–5.81)
RDW: 12.1 % (ref 11.5–15.5)
WBC Count: 11.1 10*3/uL — ABNORMAL HIGH (ref 4.0–10.5)
nRBC: 0 % (ref 0.0–0.2)

## 2019-10-05 LAB — MAGNESIUM: Magnesium: 1.8 mg/dL (ref 1.7–2.4)

## 2019-10-05 LAB — CMP (CANCER CENTER ONLY)
ALT: 15 U/L (ref 0–44)
AST: 14 U/L — ABNORMAL LOW (ref 15–41)
Albumin: 3.2 g/dL — ABNORMAL LOW (ref 3.5–5.0)
Alkaline Phosphatase: 62 U/L (ref 38–126)
Anion gap: 10 (ref 5–15)
BUN: 17 mg/dL (ref 8–23)
CO2: 28 mmol/L (ref 22–32)
Calcium: 9.2 mg/dL (ref 8.9–10.3)
Chloride: 103 mmol/L (ref 98–111)
Creatinine: 0.88 mg/dL (ref 0.61–1.24)
GFR, Est AFR Am: >60 mL/min (ref >60)
GFR, Estimated: >60 mL/min (ref >60)
Glucose, Bld: 154 mg/dL — ABNORMAL HIGH (ref 70–99)
Potassium: 3.7 mmol/L (ref 3.5–5.1)
Sodium: 141 mmol/L (ref 135–145)
Total Bilirubin: 0.5 mg/dL (ref 0.3–1.2)
Total Protein: 6.1 g/dL — ABNORMAL LOW (ref 6.5–8.1)

## 2019-10-05 LAB — LACTATE DEHYDROGENASE: LDH: 148 U/L (ref 98–192)

## 2019-10-05 LAB — URIC ACID: Uric Acid, Serum: 4.7 mg/dL (ref 3.7–8.6)

## 2019-10-05 MED ORDER — ACETAMINOPHEN 325 MG PO TABS
ORAL_TABLET | ORAL | Status: AC
  Filled 2019-10-05: qty 2

## 2019-10-05 MED ORDER — DIPHENHYDRAMINE HCL 25 MG PO CAPS
ORAL_CAPSULE | ORAL | Status: AC
  Filled 2019-10-05: qty 1

## 2019-10-05 MED ORDER — SODIUM CHLORIDE 0.9% FLUSH
10.0000 mL | INTRAVENOUS | Status: DC | PRN
  Administered 2019-10-05: 10 mL
  Filled 2019-10-05: qty 10

## 2019-10-05 MED ORDER — SODIUM CHLORIDE 0.9 % IV SOLN
50.0000 mg/m2 | Freq: Once | INTRAVENOUS | Status: AC
  Administered 2019-10-05: 100 mg via INTRAVENOUS
  Filled 2019-10-05: qty 5

## 2019-10-05 MED ORDER — DIPHENHYDRAMINE HCL 25 MG PO CAPS
ORAL_CAPSULE | ORAL | Status: AC
  Filled 2019-10-05: qty 2

## 2019-10-05 MED ORDER — PALONOSETRON HCL INJECTION 0.25 MG/5ML
INTRAVENOUS | Status: AC
  Filled 2019-10-05: qty 5

## 2019-10-05 MED ORDER — ACETAMINOPHEN 325 MG PO TABS
650.0000 mg | ORAL_TABLET | Freq: Once | ORAL | Status: AC
  Administered 2019-10-05: 650 mg via ORAL

## 2019-10-05 MED ORDER — SODIUM CHLORIDE 0.9 % IV SOLN
750.0000 mg/m2 | Freq: Once | INTRAVENOUS | Status: AC
  Administered 2019-10-05: 1540 mg via INTRAVENOUS
  Filled 2019-10-05: qty 77

## 2019-10-05 MED ORDER — PALONOSETRON HCL INJECTION 0.25 MG/5ML
0.2500 mg | Freq: Once | INTRAVENOUS | Status: AC
  Administered 2019-10-05: 0.25 mg via INTRAVENOUS

## 2019-10-05 MED ORDER — DIPHENHYDRAMINE HCL 25 MG PO CAPS
50.0000 mg | ORAL_CAPSULE | Freq: Once | ORAL | Status: AC
  Administered 2019-10-05: 50 mg via ORAL

## 2019-10-05 MED ORDER — DEXAMETHASONE SODIUM PHOSPHATE 10 MG/ML IJ SOLN
INTRAMUSCULAR | Status: AC
  Filled 2019-10-05: qty 1

## 2019-10-05 MED ORDER — DEXAMETHASONE SODIUM PHOSPHATE 10 MG/ML IJ SOLN
10.0000 mg | Freq: Once | INTRAMUSCULAR | Status: AC
  Administered 2019-10-05: 10 mg via INTRAVENOUS

## 2019-10-05 MED ORDER — SODIUM CHLORIDE 0.9 % IV SOLN
Freq: Once | INTRAVENOUS | Status: AC
  Filled 2019-10-05: qty 250

## 2019-10-05 MED ORDER — VINCRISTINE SULFATE CHEMO INJECTION 1 MG/ML
2.0000 mg | Freq: Once | INTRAVENOUS | Status: AC
  Administered 2019-10-05: 2 mg via INTRAVENOUS
  Filled 2019-10-05: qty 2

## 2019-10-05 MED ORDER — SODIUM CHLORIDE 0.9 % IV SOLN
375.0000 mg/m2 | Freq: Once | INTRAVENOUS | Status: AC
  Administered 2019-10-05: 800 mg via INTRAVENOUS
  Filled 2019-10-05: qty 50

## 2019-10-05 MED ORDER — HEPARIN SOD (PORK) LOCK FLUSH 100 UNIT/ML IV SOLN
500.0000 [IU] | Freq: Once | INTRAVENOUS | Status: AC | PRN
  Administered 2019-10-05: 500 [IU]
  Filled 2019-10-05: qty 5

## 2019-10-05 MED ORDER — SODIUM CHLORIDE 0.9 % IV SOLN
150.0000 mg | Freq: Once | INTRAVENOUS | Status: AC
  Administered 2019-10-05: 150 mg via INTRAVENOUS
  Filled 2019-10-05: qty 150

## 2019-10-05 NOTE — Patient Instructions (Signed)
[facility] Discharge Instructions for Patients Receiving Chemotherapy  Today you received the following chemotherapy agents: Vincristine, Cytoxan, Etoposide, Rituxumab  To help prevent nausea and vomiting after your treatment, we encourage you to take your nausea medication as directed by your MD.   If you develop nausea and vomiting that is not controlled by your nausea medication, call the clinic.   BELOW ARE SYMPTOMS THAT SHOULD BE REPORTED IMMEDIATELY:  *FEVER GREATER THAN 100.5 F  *CHILLS WITH OR WITHOUT FEVER  NAUSEA AND VOMITING THAT IS NOT CONTROLLED WITH YOUR NAUSEA MEDICATION  *UNUSUAL SHORTNESS OF BREATH  *UNUSUAL BRUISING OR BLEEDING  TENDERNESS IN MOUTH AND THROAT WITH OR WITHOUT PRESENCE OF ULCERS  *URINARY PROBLEMS  *BOWEL PROBLEMS  UNUSUAL RASH Items with * indicate a potential emergency and should be followed up as soon as possible.  Feel free to call the clinic should you have any questions or concerns. The clinic phone number is [phone].  Please show the [facility] at check-in to the Emergency Department and triage nurse.  Coronavirus (COVID-19) Are you at risk?  Are you at risk for the Coronavirus (COVID-19)?  To be considered HIGH RISK for Coronavirus (COVID-19), you have to meet the following criteria:  . Traveled to [place], [place], [place], [place] or [place]; or in the [place] to [place], [place], [place], or [place]; and have fever, cough, and shortness of breath within the last 2 weeks of travel OR . Been in close contact with a person diagnosed with COVID-19 within the last 2 weeks and have fever, cough, and shortness of breath . IF YOU DO NOT MEET THESE CRITERIA, YOU ARE CONSIDERED LOW RISK FOR COVID-19.  What to do if you are HIGH RISK for COVID-19?  Marland Kitchen If you are having a medical emergency, call 911. . Seek medical care right away. Before you go to a doctor's office, urgent care or emergency  department, call ahead and tell them about your recent travel, contact with someone diagnosed with COVID-19, and your symptoms. You should receive instructions from your physician's office regarding next steps of care.  . When you arrive at healthcare provider, tell the healthcare staff immediately you have returned from visiting [place], [place], [place], [place] or [place]; or traveled in the [place] to [place], [place], [place], or [place]; in the last two weeks or you have been in close contact with a person diagnosed with COVID-19 in the last 2 weeks.   . Tell the health care staff about your symptoms: fever, cough and shortness of breath. . After you have been seen by a medical provider, you will be either: o Tested for (COVID-19) and discharged home on quarantine except to seek medical care if symptoms worsen, and asked to  - Stay home and avoid contact with others until you get your results (4-5 days)  - Avoid travel on public transportation if possible (such as bus, train, or airplane) or o Sent to the Emergency Department by EMS for evaluation, COVID-19 testing, and possible admission depending on your condition and test results.  What to do if you are LOW RISK for COVID-19?  Reduce your risk of any infection by using the same precautions used for avoiding the common cold or flu:  Marland Kitchen Wash your hands often with soap and warm water for at least 20 seconds.  If soap and water are not readily available, use an alcohol-based hand sanitizer with at least 60% alcohol.  Marland Kitchen  If coughing or sneezing, cover your mouth and nose by coughing or sneezing into the elbow areas of your shirt or coat, into a tissue or into your sleeve (not your hands). . Avoid shaking hands with others and consider head nods or verbal greetings only. . Avoid touching your eyes, nose, or mouth with unwashed hands.  . Avoid close contact with people who are sick. . Avoid places or events with large numbers of people  in one location, like concerts or sporting events. . Carefully consider travel plans you have or are making. . If you are planning any travel outside or inside the [place], visit the CDC's Travelers' Health webpage for the latest health notices. . If you have some symptoms but not all symptoms, continue to monitor at home and seek medical attention if your symptoms worsen. . If you are having a medical emergency, call 911.   ADDITIONAL HEALTHCARE OPTIONS FOR PATIENTS  [facility] / e-Visit: [email or web address]         [facility]: [phone]  [facility]: [phone]                   [facility]: [phone]   

## 2019-10-06 ENCOUNTER — Telehealth: Payer: Self-pay | Admitting: Family Medicine

## 2019-10-06 ENCOUNTER — Encounter: Payer: Self-pay | Admitting: Hematology and Oncology

## 2019-10-06 NOTE — Progress Notes (Signed)
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 Lucia Gaskins for tonsillar mass present for a few weeks. 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 FISH studies pending.  2) 08/17/2019: establish care with Dr. Lorenso Courier   3) 08/28/2019: PET CT scan reveals activity in left lingual tonsil, level 2 lymph node, and level 3 lymph node. Consistent with Stage I 4) 09/04/2019: Bone marrow biopsy shows no evidence of lymphoma in the bone marrow.  5) 09/04/2019; TTE showed EF 60-65%, however patient found to have new atrial fibrillation.  6) 09/14/2019: started R-CEOP chemotherapy, Cycle 1 Day 1 7) 10/05/2019: R-CEOP chemotherapy, Cycle 2 Day 1  HISTORY OF PRESENTING ILLNESS:  MATTHEU Li 79 y.o. male with medical history significant for left tonsillar B cell lymphoma presents for f/u. He was last seen on 09/21/2019 in our clinic. In the interim he has had completed Cycle 1 of R-CEOP therapy and presents for Cycle 2 to start today.    On exam today Mr. Edmister notes that he has tolerated chemotherapy well so far.  He notes that he has not had any issues with nausea, vomiting, or diarrhea.  He does note that he has had some issues with stomach cramps, but these resolved when he took magnesium citrate.  He is no longer having issues with constipation which we discussed at our last visit.  He notes that his energy has been so-so and his appetite has been okay.  He is grateful for the counter reproduced for him to help him keep track of his oral chemotherapy and schedule medications.  On further review he does note that his hair fell out  on day 18.  He notes that it was not a slow process and that most of the hair fell out and a single episode of taking a shower.  Otherwise he does not have any concerns or complaints moving forward with cycle 2 of chemotherapy today.  A full 10 point ROS is listed below.  MEDICAL HISTORY:  Past Medical History:  Diagnosis Date  . Abdominal pain, epigastric 07/14/2009  . Abdominal wall hernia   . ALLERGIC RHINITIS 12/12/2006  . ASTHMA 11/28/2007  . 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 GLAUCOMA 12/12/2006  . 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 HERNIA, VENTRAL 01/04/2009  . HYPERLIPIDEMIA 11/28/2007  . HYPERTENSION 12/12/2006  . Leg swelling 04-24-12   occ., none at present  . RENAL CALCULUS 03/24/2010  . SYNCOPE 06/08/2008  . VENOUS INSUFFICIENCY 01/18/2010  . Visual disturbance 04-24-12   legally blind    ALLERGIES:  is allergic to dorzolamide; acetazolamide; bimatoprost; brimonidine; sulfa antibiotics; sulfamethoxazole; dorzolamide hcl-timolol mal; erythromycin; penicillins; and sulfonamide derivatives.  MEDICATIONS:  Current Outpatient Medications  Medication Sig Dispense Refill  . amLODipine (NORVASC) 5 MG tablet Take 1 tablet (5 mg total) by mouth daily. 90 tablet 3  . aspirin 81 MG tablet Take 81 mg by mouth at bedtime.    . benazepril-hydrochlorthiazide (LOTENSIN HCT) 20-12.5 MG tablet Take 1 tablet by mouth daily. 90 tablet 3  . desonide (DESOWEN) 0.05 % ointment Apply sparingly to lids as needed for dermatitis.    Marland Kitchen dorzolamide-timolol (COSOPT) 22.3-6.8 MG/ML ophthalmic solution Place 1 drop into both  eyes 2 times daily.    Marland Kitchen ibuprofen (ADVIL) 200 MG tablet Take 200 mg by mouth every 6 (six) hours as needed.    . Multiple Vitamin (MULTIVITAMIN) capsule Take 1 capsule by mouth daily.    . Netarsudil-Latanoprost (ROCKLATAN) 0.02-0.005 % SOLN Place 1 drop into both eyes daily.    . ondansetron (ZOFRAN) 8 MG tablet Take 1 tablet (8 mg total) by mouth every 8 (eight) hours as needed for nausea or  vomiting. 20 tablet 1  . pantoprazole (PROTONIX) 40 MG tablet Take 1 tablet (40 mg total) by mouth daily. For stomach protection during chemotherapy 90 tablet 1  . pravastatin (PRAVACHOL) 40 MG tablet Take 1 tablet (40 mg total) by mouth daily. Please schedule an appointment for further refills. [phone] 90 tablet 3  . predniSONE (DELTASONE) 20 MG tablet Take 5 tablets (100 mg total) by mouth as directed. Take 5 tablets per day on Days 1 through 5 of chemotherapy 25 tablet 4  . prochlorperazine (COMPAZINE) 10 MG tablet Take 1 tablet (10 mg total) by mouth every 6 (six) hours as needed for nausea or vomiting. 30 tablet 1   No current facility-administered medications for this visit.    REVIEW OF SYSTEMS:   Constitutional: ( - ) fevers, ( - )  chills , ( - ) night sweats (+) hair loss Eyes: ( - ) blurriness of vision, ( - ) double vision, ( - ) watery eyes Ears, nose, mouth, throat, and face: ( - ) mucositis, ( + ) sore throat Respiratory: ( - ) cough, ( - ) dyspnea, ( - ) wheezes Cardiovascular: ( - ) palpitation, ( - ) chest discomfort, ( - ) lower extremity swelling Gastrointestinal:  ( - ) nausea, ( - ) heartburn, ( - ) change in bowel habits Skin: ( - ) abnormal skin rashes Lymphatics: ( - ) new lymphadenopathy, ( - ) easy bruising Neurological: ( - ) numbness, ( - ) tingling, ( - ) new weaknesses Behavioral/Psych: ( - ) mood change, ( - ) new changes  All other systems were reviewed with the patient and are negative.  PHYSICAL EXAMINATION: ECOG PERFORMANCE STATUS: 1 - Symptomatic but completely ambulatory  There were no vitals filed for this visit. There were no vitals filed for this visit.  GENERAL: well appearing elderly Caucasian male in NAD, balding with considerably thinner hair on his head  SKIN: skin color, texture, turgor are normal, no rashes or significant lesions EYES: conjunctiva are pink and non-injected, sclera clear OROPHARYNX: lesion in the left posterior of the  mouth with no erythema and no exudate LUNGS: clear to auscultation and percussion with normal breathing effort HEART: regular rate & rhythm and no murmurs and no lower extremity edema Musculoskeletal: no cyanosis of digits and no clubbing  PSYCH: alert & oriented x 3, fluent speech NEURO: no focal motor/sensory deficits  LABORATORY DATA:  I have reviewed the data as listed CBC Latest Ref Rng & Units 10/05/2019 09/21/2019 09/14/2019  WBC 4.0 - 10.5 K/uL 11.1(H) 3.2(L) 10.3  Hemoglobin 13.0 - 17.0 g/dL 12.9(L) 13.6 14.5  Hematocrit 39.0 - 52.0 % 37.2(L) 38.0(L) 41.3  Platelets 150 - 400 K/uL 339 72(L) 231    CMP Latest Ref Rng & Units 10/05/2019 09/21/2019 09/14/2019  Glucose 70 - 99 mg/dL 154(H) 116(H) 124(H)  BUN 8 - 23 mg/dL 17 25(H) 17  Creatinine 0.61 - 1.24 mg/dL 0.88 0.90 0.86  Sodium 135 - 145 mmol/L 141 139 139  Potassium 3.5 -  5.1 mmol/L 3.7 3.7 3.8  Chloride 98 - 111 mmol/L 103 101 104  CO2 22 - 32 mmol/L 28 29 28   Calcium 8.9 - 10.3 mg/dL 9.2 8.7(L) 9.2  Total Protein 6.5 - 8.1 g/dL 6.1(L) 5.7(L) 6.6  Total Bilirubin 0.3 - 1.2 mg/dL 0.5 0.9 0.6  Alkaline Phos 38 - 126 U/L 62 64 67  AST 15 - 41 U/L 14(L) 9(L) 14(L)  ALT 0 - 44 U/L 15 14 12     PATHOLOGY:  SURGICAL PATHOLOGY  CASE: [id number]  PATIENT: Vondell Bryngelson  Surgical Pathology Report   Clinical History: tonsil cancer (cm)   FINAL MICROSCOPIC DIAGNOSIS:   A. TONSIL, LEFT, BIOPSY:  - Large B-cell lymphoma  - See comment   COMMENT:  The tonsillar tissue has an ulcerated surface with underlying atypical  cellular proliferation. By immunohistochemistry, the neoplastic cells  are positive for CD20, CD10, BCL6 and BCL2 (weak) but negative for CD3,  CD56, CD5, Mum-1, cytokeratin AE1/3 and EBV by in situ hybridization.  The proliferative rate by Ki-67 is 70 to 80%. Overall, the features are  consistent with a large B-cell lymphoma. The differential diagnosis  includes diffuse large B-cell lymphoma and  high-grade B-cell lymphoma  (double hit). FISH is pending and will be reported in an addendum.   Preliminary results of this case were given to Dr. Lucia Gaskins on August 14, 2019.   Dr. Tresa Moore reviewed the case and agrees with the above diagnosis.   GROSS DESCRIPTION:   The specimen is received in formalin and consists of a 0.9 x 0.4 x 0.4  cm piece of tan soft tissue. The specimen is entirely submitted in 1  cassette. Craig Staggers 08/14/2019)   Final Diagnosis performed by Thressa Sheller, MD.  Electronically signed  08/14/2019  Technical and / or Professional components performed at [facility]. [facility], [place] [address] [place], [place] [address].  Immunohistochemistry Technical component (if applicable) was performed  at [facility]. [address], [place],  [place], [place] [address].  IMMUNOHISTOCHEMISTRY DISCLAIMER (if applicable):  Some of these immunohistochemical stains may have been developed and the  performance characteristics determine by [facility]. Some  may not have been cleared or approved by the U.S. Food and Drug  Administration. The FDA has determined that such clearance or approval  is not necessary. This test is used for clinical purposes. It should not  be regarded as investigational or for research. This laboratory is  certified under the [place]  (CLIA-88) as qualified to perform high complexity clinical laboratory  testing. The controls stained appropriately.  SURGICAL PATHOLOGY  ** THIS IS AN ADDENDUM REPORT **  CASE: [id number]  PATIENT: Shion Medlock  Bone Marrow Report  **Addendum **   Reason for Addendum #1: Cytogenetics results   Clinical History: lymphoma, DLBCL, left posterior iliac, (ADC)    DIAGNOSIS:   BONE MARROW, ASPIRATE, CLOT, CORE:  - Normocellular marrow with trilineage hematopoiesis  - No lymphoma identified   PERIPHERAL BLOOD:  -  Morphologically unremarkable  - See complete blood cell count   MICROSCOPIC DESCRIPTION:   PERIPHERAL BLOOD SMEAR: The peripheral blood is morphologically  unremarkable.   BONE MARROW ASPIRATE: Spicular, cellular and adequate for evaluation  Erythroid precursors: Orderly maturation without overt dysplasia  Granulocytic precursors: Orderly maturation without overt dysplasia  Megakaryocytes: Qualitatively and quantitatively unremarkable  Lymphocytes/plasma cells: No lymphocytosis or plasmacytosis   TOUCH PREPARATIONS: Hypocellular with no additional findings ascompared  to the aspirate smears.   CLOT AND BIOPSY: Examination of the core biopsy and clot section reveals  a normocellular bone marrow (30-40%) with trilineage hematopoiesis.  Myeloid and erythroid elements are present in essentially normal  proportions. Megakaryocytes display a spectrum of maturation without  clustering. There are no granulomas identified. A single well-formed  lymphoid aggregate is noted on the clot section. By  immunohistochemistry, is composed of an admixture of B and T cells  (CD20, CD3) without aberrant B-cell expression of CD5 or CD10.   IRON STAIN: Iron stains are performed on a bone marrow aspirate or touch  imprint smear and section of clot. The controls stained appropriately.     Storage Iron: Present    Ring Sideroblasts: Not identified   ADDITIONAL DATA/TESTING: Cytogenetics is pending and will be reported  separately. Flow cytometry performed on the bone marrow biopsy did not  identify a monoclonal B-cell or phenotypically aberrant T-cell  population (See [id number]).   CELL COUNT DATA:   Bone Marrow count performed on 500 cells shows:  Blasts:  0%  Myeloid: 57%  Promyelocytes: 0%  Erythroid:   28%  Myelocytes:  10% Lymphocytes:  7%  Metamyelocytes:   2%  Plasma cells: 8%  Bands:  7%  Neutrophils:  33% M:E ratio:   2.03  Eosinophils:  5%  Basophils:    0%  Monocytes:   0%   Lab Data: CBC performed on 09/04/2019 shows:  WBC: 8.6 k/uL Neutrophils:  63%  Hgb: 14.2 g/dL Lymphocytes:  30%  HCT: 41.2 %  Monocytes:   3%  MCV: 94.7 fL  Eosinophils:  4%  RDW: 11.8 %  Basophils:   0%  PLT: 263 k/uL   GROSS DESCRIPTION:   A: Aspirate smear   B: Received in B-plus fixative are tissue fragments measuring 2.1 x 1.0  x 0.3 cm in aggregate. The specimen is submitted in total.   C: Received in B-plus fixative is a 2.0 x 0.2 cm core of bone which is  submitted in toto fine decalcification. [facility] 09/04/2019)    Final Diagnosis performed by Thressa Sheller, MD.  Electronically signed  09/08/2019  Technical component performed at [facility]  [address], [place], [place] [address].  Professional component performed at [employer]. [facility],  [place] [address], [place], [place] [address].  Immunohistochemistry Technical component (if applicable) was performed  at [facility]. [address] [place],  [place], [place] [address].  IMMUNOHISTOCHEMISTRY DISCLAIMER (if applicable):  Some of these immunohistochemical stains may have been developed and the  performance characteristics determine by [facility]. Some  may not have been cleared or approved by the U.S. Food and Drug  Administration. The FDA has determined that such clearance or approval  is not necessary. This test is used for clinical purposes. It should not  be regarded as investigational or for research. This laboratory is  certified under the [place]  (CLIA-88) as qualified to perform high complexity clinical laboratory  testing. The controls stained appropriately.   ADDENDUM:   ** Please note this testing was performed and interpreted by an outside  facility. This addendum is only being added to provide a summary of the  results for report completeness.  Please see electronic medical record  for a copy of the full report. **   Karyotype: 47,?XY,?del(?9)?(?q13q22)?,?+?mar[?3]?/?46,?XY[?20]?  Interpretation: ABNORMAL MALE KARYOTYPE   Cytogenetic analysis shows an abnormal male karyotype. 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 The remaining twenty cells show a normal  karyotype.   Deletion of chromosome 9q occurs in both myeloid (more common) and  lymphoid disorders, including acute myeloid leukemia (AML) (more  commonly) and myelodysplastic syndrome (MDS) (only rarely). In MDS the  prognostic significance would be expected to be intermediate.  In AML  the prognosis is variable. Marker chromosomes and structural  chromosomal aberrations in the absence of autosomal monosomies have  minimal prognostic impact. Correlation with other clinical and  laboratory findings is indicated.    RADIOGRAPHIC STUDIES:  IR IMAGING GUIDED PORT INSERTION  Result Date: 09/07/2019 INDICATION: History of lymphoma. In need of durable intravenous access for chemotherapy administration. EXAM: IMPLANTED PORT A CATH PLACEMENT WITH ULTRASOUND AND FLUOROSCOPIC GUIDANCE COMPARISON:  PET-CT - 08/28/2019 MEDICATIONS: Vancomycin 1 gm IV; The antibiotic was administered within an appropriate time interval prior to skin puncture. ANESTHESIA/SEDATION: Moderate (conscious) sedation was employed during this procedure. A total of Versed 2 mg and Fentanyl 100 mcg was administered intravenously. Moderate Sedation Time: 23 minutes. The patient's level of consciousness and vital signs were monitored continuously by radiology nursing throughout the procedure under my direct supervision. CONTRAST:  None FLUOROSCOPY TIME:  18 seconds (6 mGy) COMPLICATIONS: None immediate. PROCEDURE: The procedure, risks, benefits, and alternatives were explained to the patient. Questions regarding the procedure were encouraged and  answered. The patient understands and consents to the procedure. The right neck and chest were prepped with chlorhexidine in a sterile fashion, and a sterile drape was applied covering the operative field. Maximum barrier sterile technique with sterile gowns and gloves were used for the procedure. A timeout was performed prior to the initiation of the procedure. Local anesthesia was provided with 1% lidocaine with epinephrine. After creating a small venotomy incision, a micropuncture kit was utilized to access the internal jugular vein. Real-time ultrasound guidance was utilized for vascular access including the acquisition of a permanent ultrasound image documenting patency of the accessed vessel. The microwire was utilized to measure appropriate catheter length. A subcutaneous port pocket was then created along the upper chest wall utilizing a combination of sharp and blunt dissection. The pocket was irrigated with sterile saline. A single lumen "Slim" sized power injectable port was chosen for placement. The 8 Fr catheter was tunneled from the port pocket site to the venotomy incision. The port was placed in the pocket. The external catheter was trimmed to appropriate length. At the venotomy, an 8 Fr peel-away sheath was placed over a guidewire under fluoroscopic guidance. The catheter was then placed through the sheath and the sheath was removed. Final catheter positioning was confirmed and documented with a fluoroscopic spot radiograph. The port was accessed with a Huber needle, aspirated and flushed with heparinized saline. The venotomy site was closed with an interrupted 4-0 Vicryl suture. The port pocket incision was closed with interrupted 2-0 Vicryl suture and the skin was opposed with a running subcuticular 4-0 Vicryl suture. Dermabond and Steri-strips were applied to both incisions. Dressings were placed. The patient tolerated the procedure well without immediate post procedural complication. FINDINGS:  After catheter placement, the tip lies within the superior cavoatrial junction. The catheter aspirates and flushes normally and is ready for immediate use. IMPRESSION: Successful placement of a right internal jugular approach power injectable Port-A-Cath. The catheter is ready for immediate use. Electronically Signed   By: Sandi Mariscal M.D.   On:  09/07/2019 15:48    ASSESSMENT & PLAN LUCAH Li 79 y.o. 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   In the interim since his last visit the patient has completed cycle 1 of R-CEOP.  So far the patient is tolerating the treatment well with no adverse side effects other than alopecia and some mild constipation.  He denies having any nausea, vomiting, or diarrhea.  He does not currently have any focal symptoms.  The treatment plan is for Rituximab 375 mg/m2, Cyclophosphamide 715m/m2, etoposide IV 581mm2 (followed by 10021m2 PO on Day 2 and 3), and Vincristine 1.4 mg/m2 all on Day 1. The patient will also received PO prednisone 100m62my 1-5 of each 3 week cycle. Will tentatively plan for 3 cycles followed by ISRT vs 4-6 cycles if no ISRT.   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 Bone marrow biopsy confirmed no alternative sites of lymphoma.  -- started R-CEOP chemotherapy on 09/14/2019. This is therapy with curative intent. Etoposide was substituted for anthracycline therapy due to his atrial fibrillation found on TTE.  -- At this time his findings are consistent with a Stage I. Plan is for 3 cycles of R-CEOP with ISRT to site of disease assuming interval PET scan shows no active disease. Will refer to Rad/Onc for initial consideration of ISRT.  --plan to have patient RTC 10/26/2019 prior to the start of Cycle 3 chemotherapy.   #Oropharynx Pain, resolved --continue taking ibuprofen  PRN --patient has oxycodone 5-10mg6m PRN available for throat pain, though he has not needed this --no improvement viscous lidocaine, patient noted that regular mouthwash provided more pain relief.  --provided patient with senna-docusate to assure these medications do not cause him constipation. --continue to monitor  #Symptom Management --will assure patient has zofran 8mg P40m8H PRN and compazine 25mg q60mRN. 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   #Atrial Fibrillation, new --currently working with cardiology for recommendations regarding anticoagulation --anticoagulation therapy OK as long as patient Plts >50k   No orders of the defined types were placed in this encounter.   All questions were answered. The patient knows to call the clinic with any problems, questions or concerns.  A total of more than 30 minutes were spent on this encounter and over half of that time was spent on counseling and coordination of care as outlined above.   Tienna Bienkowski T.Ledell Peoplespartment of Hematology/Oncology [facility] [phone] [email or web address]  10/06/2019 6:47 PM

## 2019-10-06 NOTE — Progress Notes (Signed)
  Chronic Care Management   Outreach Note  10/06/2019 Name: Charles Li MRN: [id number] DOB: 08-09-1940  Referred by: Eulas Post, MD Reason for referral : No chief complaint on file.   An unsuccessful telephone outreach was attempted today. The patient was referred to the pharmacist for assistance with care management and care coordination.   Follow Up Plan:   [name] [employer] Scheduler

## 2019-10-06 NOTE — Chronic Care Management (AMB) (Signed)
  Chronic Care Management   Note  10/06/2019 Name: Charles Li MRN: [id number] DOB: October 13, 1940  [name] is a 79 y.o. year old male who is a primary care patient of Burchette, Alinda Sierras, MD. I reached out to [facility] by phone today in response to a referral sent by [name]'s PCP, Eulas Post, MD.   [name] was given information about Chronic Care Management services today including:  1. CCM service includes personalized support from designated clinical staff supervised by his physician, including individualized plan of care and coordination with other care providers 2. 24/7 contact phone numbers for assistance for urgent and routine care needs. 3. Service will only be billed when office clinical staff spend 20 minutes or more in a month to coordinate care. 4. Only one practitioner may furnish and bill the service in a calendar month. 5. The patient may stop CCM services at any time (effective at the end of the month) by phone call to the office staff.   Patient agreed to services and verbal consent obtained.   Follow up plan:   [name] [employer] Scheduler

## 2019-10-09 ENCOUNTER — Other Ambulatory Visit: Payer: Self-pay

## 2019-10-09 ENCOUNTER — Encounter: Payer: Self-pay | Admitting: *Deleted

## 2019-10-09 ENCOUNTER — Telehealth: Payer: Self-pay | Admitting: Hematology and Oncology

## 2019-10-09 ENCOUNTER — Inpatient Hospital Stay: Payer: Medicare HMO | Attending: Hematology and Oncology

## 2019-10-09 DIAGNOSIS — Z5189 Encounter for other specified aftercare: Secondary | ICD-10-CM | POA: Diagnosis not present

## 2019-10-09 DIAGNOSIS — Z5111 Encounter for antineoplastic chemotherapy: Secondary | ICD-10-CM | POA: Diagnosis present

## 2019-10-09 DIAGNOSIS — Z5112 Encounter for antineoplastic immunotherapy: Secondary | ICD-10-CM | POA: Diagnosis not present

## 2019-10-09 DIAGNOSIS — C099 Malignant neoplasm of tonsil, unspecified: Secondary | ICD-10-CM | POA: Diagnosis not present

## 2019-10-09 DIAGNOSIS — C8331 Diffuse large B-cell lymphoma, lymph nodes of head, face, and neck: Secondary | ICD-10-CM | POA: Diagnosis present

## 2019-10-09 MED ORDER — PEGFILGRASTIM-JMDB 6 MG/0.6ML ~~LOC~~ SOSY
6.0000 mg | PREFILLED_SYRINGE | Freq: Once | SUBCUTANEOUS | Status: AC
  Administered 2019-10-09: 6 mg via SUBCUTANEOUS

## 2019-10-09 MED ORDER — PEGFILGRASTIM-JMDB 6 MG/0.6ML ~~LOC~~ SOSY
PREFILLED_SYRINGE | SUBCUTANEOUS | Status: AC
  Filled 2019-10-09: qty 0.6

## 2019-10-09 NOTE — Progress Notes (Signed)
Oncology Nurse Navigator Documentation  Placed introductory call to Charles Li. Introduced myself and Charles Li as the H&N Navigators that work with Charles Li to whom he has been referred by Charles Li. Clarified his misunderstanding recent call from [place] was to schedule an appt with Charles Li on 4/13 and not post-chemo lab. I confirmed his understand of Charles Li plan for him to start RT s/p 3rd cycle of chemo scheduled for 4/19.   I explained:  Charles Li usually prefers to initiate RT several weeks s/p chemo in order to provide period of recovery.  The purpose of a pre-radiotherapy dental evaluation, noted he may be contacted by Charles Li to schedule appt, noted patients sometimes need extractions prior to starting RT.  S/p clearance from Li/oral surgeon, he will be scheduled for CT SIM. He stated:  Does not have transportation needs at this time.  He is blind, stated preference for written/printed information so his wife can read it back to him. I provided my phone number, encouraged him to call with questions/concerns moving forward.  He acknowledged.  Charles Orem, RN, BSN Head & Neck Oncology Nurse [place] [facility] [place] [phone]

## 2019-10-09 NOTE — Telephone Encounter (Signed)
Scheduled per los. Called and left msg. Mailed printout  °

## 2019-10-09 NOTE — Patient Instructions (Signed)

## 2019-10-14 ENCOUNTER — Telehealth: Payer: Self-pay | Admitting: Internal Medicine

## 2019-10-14 NOTE — Telephone Encounter (Signed)
Patient states he is returning April Boyd's call. He states he is blind and unable to answer messages.

## 2019-10-14 NOTE — Progress Notes (Signed)
Head and Neck Cancer Location of Tumor / Histology:  LEFT tonsillar B cell lymphoma  Patient presented ~2 months ago with symptoms of:  Soreness on left side of throat and his wife noticed noticed malodorous breath. He was then seen by his PCP who noticed an ulcer on patient's left tonsil, and therefore referred him to ENT  Biopsies of oropharynx and bone marrow revealed:  08/10/2019 Patient with a left tonsil mass that extends down toward the base of tongue.  Vallecula and epiglottis were uninvolved.  Mass extends up toward the superior pole of the tonsil but does not involve the soft palate. 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  Clinical T2N1  09/04/2019 DIAGNOSIS:  BONE MARROW, ASPIRATE, CLOT, CORE:  - Normocellular marrow with trilineage hematopoiesis  - No lymphoma identified  PERIPHERAL BLOOD:  - Morphologically unremarkable  - See complete blood cell count  MICROSCOPIC DESCRIPTION:  PERIPHERAL BLOOD SMEAR: The peripheral blood is morphologically  unremarkable.  BONE MARROW ASPIRATE: Spicular, cellular and adequate for evaluation  Erythroid precursors: Orderly maturation without overt dysplasia  Granulocytic precursors: Orderly maturation without overt dysplasia  Megakaryocytes: Qualitatively and quantitatively unremarkable  Lymphocytes/plasma cells: No lymphocytosis or plasmacytosis  TOUCH PREPARATIONS: Hypocellular with no additional findings ascompared  to the aspirate smears.  CLOT AND BIOPSY: Examination of the core biopsy and clot section reveals  a normocellular bone marrow (30-40%) with trilineage hematopoiesis.  Myeloid and erythroid elements are present in essentially normal  proportions. Megakaryocytes display a spectrum of maturation without  clustering. There are no granulomas identified. A single well-formed  lymphoid aggregate is noted on the clot section. By  immunohistochemistry, is composed of an admixture of B and T cells  (CD20, CD3) without aberrant B-cell  expression of CD5 or CD10.  IRON STAIN: Iron stains are performed on a bone marrow aspirate or touch  imprint smear and section of clot. The controls stained appropriately.    Storage Iron: Present    Ring Sideroblasts: Not identified  ADDITIONAL DATA/TESTING: Cytogenetics is pending and will be reported  separately. Flow cytometry performed on the bone marrow biopsy did not  identify a monoclonal B-cell or phenotypically aberrant T-cell  population (See [id number]).  CELL COUNT DATA:  Bone Marrow count performed on 500 cells shows:  Blasts:  0%  Myeloid: 57%  Promyelocytes: 0%  Erythroid:   28%  Myelocytes:  10% Lymphocytes:  7%  Metamyelocytes:   2%  Plasma cells: 8%  Bands:  7%  Neutrophils:  33% M:E ratio:   2.03  Eosinophils:  5%  Basophils:   0%  Monocytes:   0%  Lab Data: CBC performed on 09/04/2019 shows:  WBC: 8.6 k/uL Neutrophils:  63%  Hgb: 14.2 g/dL Lymphocytes:  30%  HCT: 41.2 %  Monocytes:   3%  MCV: 94.7 fL  Eosinophils:  4%  RDW: 11.8 %  Basophils:   0%  PLT: 263 k/uL   Nutrition Status Yes No Comments  Weight changes? []  [x]    Swallowing concerns? []  [x]    PEG? []  [x]     Referrals Yes No Comments  Social Work? []  [x]    Dentistry? []  [x]    Swallowing therapy? []  [x]    Nutrition? []  [x]    Med/Onc? [x]  []  Under care of Dr. Narda Rutherford IV   Safety Issues Yes No Comments  Prior radiation? []  [x]    Pacemaker/ICD? []  [x]    Possible current pregnancy? []  [x]    Is the patient on methotrexate? []  [x]     Tobacco/Marijuana/Snuff/ETOH use:  He  stopped smoking over 30 years ago.  Occasionally drinks wine and beer.  Past/Anticipated interventions by otolaryngology, if any:  LEFT tonsil biopsied by Dr. Melony Overly on 08/10/2019 and who then referred patient to Dr. Fredricka Bonine at [facility]  Past/Anticipated interventions by medical oncology, if any:  Under care of Dr. Narda Rutherford IV Originally planned to  receive 3 cycles of R-CHOP but was changed to 3 cycles of R-CEOP after a.fib noted on EKG and during port-a-cath placement. 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 He lives at home with his wife who is showing signs of early dementia. He's worried about possibly losing more teeth during radiation treatment (per patient he currently only has 2/3 for his teeth and doesn't want to lose more). He is also claustrophobic and worried about stabilizing mask needed to be worn during treatment.    No chief complaint on file.

## 2019-10-16 ENCOUNTER — Telehealth (INDEPENDENT_AMBULATORY_CARE_PROVIDER_SITE_OTHER): Payer: Medicare HMO | Admitting: Internal Medicine

## 2019-10-16 ENCOUNTER — Encounter: Payer: Self-pay | Admitting: Internal Medicine

## 2019-10-16 ENCOUNTER — Other Ambulatory Visit: Payer: Self-pay

## 2019-10-16 VITALS — Ht 70.0 in | Wt 185.0 lb

## 2019-10-16 DIAGNOSIS — I4819 Other persistent atrial fibrillation: Secondary | ICD-10-CM | POA: Diagnosis not present

## 2019-10-16 DIAGNOSIS — I10 Essential (primary) hypertension: Secondary | ICD-10-CM | POA: Diagnosis not present

## 2019-10-16 DIAGNOSIS — I4891 Unspecified atrial fibrillation: Secondary | ICD-10-CM

## 2019-10-16 DIAGNOSIS — R001 Bradycardia, unspecified: Secondary | ICD-10-CM

## 2019-10-16 DIAGNOSIS — C8331 Diffuse large B-cell lymphoma, lymph nodes of head, face, and neck: Secondary | ICD-10-CM

## 2019-10-16 NOTE — Progress Notes (Signed)
Electrophysiology TeleHealth Note   Due to national recommendations of social distancing due to [name], Audio/video telehealth visit is felt to be most appropriate for this patient at this time.  See MyChart message from today for patient consent regarding telehealth for [name].   Date:  10/17/2019   ID:  GOTTI KIRSCH, DOB [birth year], MRN [id number]  Location: home  Provider location: [address], [place] Evaluation Performed: New patient consult  PCP:  Eulas Post, MD  Cardiologist:  Dr Haroldine Laws Electrophysiologist:  None   Chief Complaint:  afib  History of Present Illness:    Charles Li is a 79 y.o. male who presents via audio/video conferencing for a telehealth visit today.   The patient is referred for new consultation regarding afib by Dr Haroldine Laws.  The patient has B cell lymphoma for which he is receiving treatment.  He has also been found to have atrial fibrillation.  He has bradycardia with his afib.  Mostly unaware.  He feels that perhaps he may be more SOB than previously.  + edema He was evaluated by Dr Haroldine Laws and is referred for further evaluation.  Today, he denies symptoms of palpitations, chest pain, lower extremity edema, claudication, dizziness, presyncope, syncope, bleeding, or neurologic sequela. The patient is tolerating medications without difficulties and is otherwise without complaint today.     Past Medical History:  Diagnosis Date  . Abdominal pain, epigastric 07/14/2009  . Abdominal wall hernia   . ALLERGIC RHINITIS 12/12/2006  . ASTHMA 11/28/2007  . 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 Diffuse large B cell lymphoma ([place])   . GLAUCOMA 12/12/2006  . 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 HERNIA, VENTRAL 01/04/2009  . HYPERLIPIDEMIA 11/28/2007  . HYPERTENSION 12/12/2006  . Leg swelling 04-24-12   occ., none at present  . Persistent atrial fibrillation ([place])   . RENAL CALCULUS 03/24/2010  . VENOUS INSUFFICIENCY 01/18/2010   . Visual disturbance 04-24-12   legally blind    Past Surgical History:  Procedure Laterality Date  . CATARACT EXTRACTION  2009 - approximate   bilat & glaucoma surgery (7 ophth surgeries)  . INGUINAL HERNIA REPAIR  05/02/2012   Procedure: HERNIA REPAIR INGUINAL ADULT BILATERAL;  Surgeon: Shann Medal, MD;  Location: [facility];  Service: General;  Laterality: N/A;  . IR IMAGING GUIDED PORT INSERTION  09/07/2019  . VASECTOMY  04-24-12  . VENTRAL HERNIA REPAIR  05/02/2012   Procedure: LAPAROSCOPIC VENTRAL HERNIA;  Surgeon: Shann Medal, MD;  Location: [facility];  Service: General;  Laterality: N/A;    Current Outpatient Medications  Medication Sig Dispense Refill  . amLODipine (NORVASC) 5 MG tablet Take 1 tablet (5 mg total) by mouth daily. 90 tablet 3  . benazepril-hydrochlorthiazide (LOTENSIN HCT) 20-12.5 MG tablet Take 1 tablet by mouth daily. 90 tablet 3  . desonide (DESOWEN) 0.05 % ointment Apply sparingly to lids as needed for dermatitis.    [place] Kitchen dorzolamide-timolol (COSOPT) 22.3-6.8 MG/ML ophthalmic solution Place 1 drop into both eyes 2 times daily.    [place] Kitchen ibuprofen (ADVIL) 200 MG tablet Take 200 mg by mouth every 6 (six) hours as needed.    . Multiple Vitamin (MULTIVITAMIN) capsule Take 1 capsule by mouth daily.    . Netarsudil-Latanoprost (ROCKLATAN) 0.02-0.005 % SOLN Place 1 drop into both eyes daily.    . ondansetron (ZOFRAN) 8 MG tablet Take 1 tablet (8 mg total) by mouth every 8 (eight) hours as needed for nausea or vomiting.  20 tablet 1  . pantoprazole (PROTONIX) 40 MG tablet Take 1 tablet (40 mg total) by mouth daily. For stomach protection during chemotherapy 90 tablet 1  . pravastatin (PRAVACHOL) 40 MG tablet Take 1 tablet (40 mg total) by mouth daily. Please schedule an appointment for further refills. [phone] 90 tablet 3  . predniSONE (DELTASONE) 20 MG tablet Take 5 tablets (100 mg total) by mouth as directed. Take 5 tablets per day on Days 1 through 5 of chemotherapy 25  tablet 4  . prochlorperazine (COMPAZINE) 10 MG tablet Take 1 tablet (10 mg total) by mouth every 6 (six) hours as needed for nausea or vomiting. 30 tablet 1   No current facility-administered medications for this visit.    Allergies:   Dorzolamide, Acetazolamide, Bimatoprost, Brimonidine, Sulfa antibiotics, Sulfamethoxazole, Dorzolamide hcl-timolol mal, Erythromycin, Penicillins, and Sulfonamide derivatives   Social History:  The patient  reports that he quit smoking about 49 years ago. His smoking use included cigarettes. He has a 21.00 pack-year smoking history. He has never used smokeless tobacco. He reports current alcohol use of about 14.0 standard drinks of alcohol per week. He reports that he does not use drugs.   Family History:  The patient's family history includes Birth defects in his paternal aunt; Cancer in his mother; Heart disease in his brother; Pneumonia in his father.    ROS:  Please see the history of present illness.   All other systems are personally reviewed and negative.    Exam:    Vital Signs:  Ht 5\' 10"  (1.778 m)   Wt 185 lb (83.9 kg)   BMI 26.54 kg/m    Well sounding, alert and conversant    Labs/Other Tests and Data Reviewed:    Recent Labs: 10/05/2019: ALT 15; BUN 17; Creatinine 0.88; Hemoglobin 12.9; Magnesium 1.8; Platelet Count 339; Potassium 3.7; Sodium 141   Wt Readings from Last 3 Encounters:  10/16/19 185 lb (83.9 kg)  10/05/19 187 lb 8 oz (85 kg)  09/21/19 188 lb 12.8 oz (85.6 kg)     Other studies personally reviewed: Additional studies/ records that were reviewed today include: Dr Gillermina Hu notes,  Oncology notes,  echo Review of the above records today demonstrates: as above  ASSESSMENT & PLAN:    1.  Persistent atrial fibrillation The patient has minimally symptomatic afib He is rate controlled, actually with bradycardia off of AV nodal agents. His chads2vasc score is at least 3.  He is not on anticoagulation. I have reviewed Dr  Libby Maw notes which suggest that he would be on anticoagulation as long as his platelet count is > 50k.  The patient recently had drop in platelets to 70k and is about to start another round of chemotherapy.  He is very clear that he would like to finish chemotherapy before considering anticoagualtion.  He feels that his chemotherapy may be done in 2-3 weeks. At that time, I would advise initiation of anticoagulation followed by cardioversion once therapeutic.  The patient is very clear that he is not interested in any changes currently. I will have him follow-up in the AF clinic in 4 weeks for further consideration of anticoagulation and cardioversion after his chemotherapy is done. Of note, he declines sleep study which Dr Haroldine Laws has advised.  I did discuss importance of excluding OSA given his afib.  He may be more willing to consider after his chemotherapy is completely.  2. Bradycardia Minimally symptomatic No indication for pacing currently Will continue to follow  3. HTN  Stable No change required today   Follow-up:  AF clinic in 4 weeks  Current medicines are reviewed at length with the patient today.   The patient does not have concerns regarding his medicines.  The following changes were made today:  none  Labs/ tests ordered today include:  No orders of the defined types were placed in this encounter.   Patient Risk:  after full review of this patients clinical status, I feel that they are at moderate risk at this time.   Today, I have spent 20 minutes with the patient with telehealth technology discussing afib .    Signed, Thompson Grayer MD, [place] 10/17/2019 8:13 PM   [place] [id number] [phone] (office) [phone] (fax)

## 2019-10-17 ENCOUNTER — Encounter: Payer: Self-pay | Admitting: Internal Medicine

## 2019-10-19 DIAGNOSIS — H401133 Primary open-angle glaucoma, bilateral, severe stage: Secondary | ICD-10-CM | POA: Diagnosis not present

## 2019-10-21 ENCOUNTER — Ambulatory Visit
Admission: RE | Admit: 2019-10-21 | Discharge: 2019-10-21 | Disposition: A | Discharge status: Home / Self Care | Payer: Medicare HMO | Source: Ambulatory Visit | Attending: Radiation Oncology | Admitting: Radiation Oncology

## 2019-10-21 ENCOUNTER — Other Ambulatory Visit: Payer: Self-pay

## 2019-10-21 ENCOUNTER — Encounter: Payer: Self-pay | Admitting: Radiation Oncology

## 2019-10-21 DIAGNOSIS — C8331 Diffuse large B-cell lymphoma, lymph nodes of head, face, and neck: Secondary | ICD-10-CM

## 2019-10-21 NOTE — Progress Notes (Signed)
Radiation Oncology         [phone] ________________________________  Initial outpatient Consultation by telephone.  The patient opted for telemedicine to maximize safety during the pandemic.  MyChart video was not obtainable.   Name: Charles Li MRN: [id number]  Date: 10/21/2019  DOB: 16-Aug-1940  WU:[id number], Alinda Sierras, MD  Orson Slick, MD   REFERRING PHYSICIAN: Orson Slick, MD  DIAGNOSIS:    ICD-10-CM   1. Diffuse large B-cell lymphoma of lymph nodes of neck (HCC)  C83.31 Ambulatory referral to Social Work    Ambulatory referral to Nutrition and Diabetic Education    CHIEF COMPLAINT: Here to discuss management of large B-cell lymphoma  Stage: IIA  HISTORY OF PRESENT ILLNESS::Charles Li is a 79 y.o. male who presented with foul-smelling breath, as well as a left tonsillar lesion.  Subsequently, the patient saw Dr. Lucia Gaskins who performed laryngoscopy and biopsy of the lesion. Laryngoscopy showed a left tonsil mass that extends down towards base of tongue and up towards superior pole of tonsil. Vallecula, epiglottis, and soft palate not involved.  Biopsy of left tonsil mass on 08/10/2019 revealed: large B-cell lymphoma with MYC rearrangement.  No double hit. he was referred to Dr. Lorenso Courier on 09/02/2019 and proceeded to bone marrow biopsy on 09/04/2019, which was negative for lymphoma.  Pertinent imaging thus far includes PET performed on 08/28/2019 revealing: intense hypermetabolic activity in left lingual tonsil; enlarged hypermetabolic left level 2 lymph node; smaller left level 3 lymph node with hypermetabolism; indeterminate small moderately metabolic left lobe nodule; obstructing calculus in mid right ureter causing severe chronic right hydronephrosis.  I have personally reviewed his imaging  He is currently receiving R-CEOP chemotherapy under Dr. Lorenso Courier and is s/p 2 cycles.  R-CHOP was not given due to atrial fibrillation.    Weight Changes: He reports  gradual intentional weight loss over the past year.  No unintentional weight loss Wt Readings from Last 3 Encounters:  10/16/19 185 lb (83.9 kg)  10/05/19 187 lb 8 oz (85 kg)  09/21/19 188 lb 12.8 oz (85.6 kg)      Tobacco history, if any: former smoker, quit 30 years ago  ETOH abuse, if any: none, occasional use  Prior cancers, if any: none  He denies any night sweats or fevers prior to diagnosis.  He has lost most of his sight from glaucoma.  His wife drives him to appointments.  He is worried about losing more teeth.  He has claustrophobia.  He reports that his wife shows early signs of dementia.  PREVIOUS RADIATION THERAPY: No  PAST MEDICAL HISTORY:  has a past medical history of Abdominal pain, epigastric (07/14/2009), Abdominal wall hernia, ALLERGIC RHINITIS (12/12/2006), ASTHMA (11/28/2007), BRADYCARDIA (06/08/2008), Diffuse large B cell lymphoma ([place]), GLAUCOMA (12/12/2006), HEARING LOSS, HIGH FREQUENCY (12/12/2006), HERNIA, VENTRAL (01/04/2009), HYPERLIPIDEMIA (11/28/2007), HYPERTENSION (12/12/2006), Leg swelling (04-24-12), Persistent atrial fibrillation ([place]), RENAL CALCULUS (03/24/2010), VENOUS INSUFFICIENCY (01/18/2010), and Visual disturbance (04-24-12).    PAST SURGICAL HISTORY: Past Surgical History:  Procedure Laterality Date  . CATARACT EXTRACTION  2009 - approximate   bilat & glaucoma surgery (7 ophth surgeries)  . INGUINAL HERNIA REPAIR  05/02/2012   Procedure: HERNIA REPAIR INGUINAL ADULT BILATERAL;  Surgeon: Shann Medal, MD;  Location: [facility];  Service: General;  Laterality: N/A;  . IR IMAGING GUIDED PORT INSERTION  09/07/2019  . VASECTOMY  04-24-12  . VENTRAL HERNIA REPAIR  05/02/2012   Procedure: LAPAROSCOPIC VENTRAL HERNIA;  Surgeon: Shann Medal,  MD;  Location: [facility];  Service: General;  Laterality: N/A;    FAMILY HISTORY: family history includes Birth defects in his paternal aunt; Cancer in his mother; Heart disease in his brother; Pneumonia in his father.  SOCIAL  HISTORY:  reports that he quit smoking about 49 years ago. His smoking use included cigarettes. He has a 21.00 pack-year smoking history. He has never used smokeless tobacco. He reports current alcohol use of about 14.0 standard drinks of alcohol per week. He reports that he does not use drugs.  ALLERGIES: Dorzolamide, Acetazolamide, Bimatoprost, Brimonidine, Sulfa antibiotics, Sulfamethoxazole, Dorzolamide hcl-timolol mal, Erythromycin, Penicillins, and Sulfonamide derivatives  MEDICATIONS:  Current Outpatient Medications  Medication Sig Dispense Refill  . amLODipine (NORVASC) 5 MG tablet Take 1 tablet (5 mg total) by mouth daily. 90 tablet 3  . benazepril-hydrochlorthiazide (LOTENSIN HCT) 20-12.5 MG tablet Take 1 tablet by mouth daily. 90 tablet 3  . dorzolamide-timolol (COSOPT) 22.3-6.8 MG/ML ophthalmic solution Place 1 drop into both eyes 2 times daily.    . Multiple Vitamin (MULTIVITAMIN) capsule Take 1 capsule by mouth daily.    . Netarsudil-Latanoprost (ROCKLATAN) 0.02-0.005 % SOLN Place 1 drop into both eyes daily.    . pantoprazole (PROTONIX) 40 MG tablet Take 1 tablet (40 mg total) by mouth daily. For stomach protection during chemotherapy 90 tablet 1  . pravastatin (PRAVACHOL) 40 MG tablet Take 1 tablet (40 mg total) by mouth daily. Please schedule an appointment for further refills. [phone] 90 tablet 3  . predniSONE (DELTASONE) 20 MG tablet Take 5 tablets (100 mg total) by mouth as directed. Take 5 tablets per day on Days 1 through 5 of chemotherapy 25 tablet 4  . desonide (DESOWEN) 0.05 % ointment Apply sparingly to lids as needed for dermatitis.    [place] Kitchen ibuprofen (ADVIL) 200 MG tablet Take 200 mg by mouth every 6 (six) hours as needed.    . ondansetron (ZOFRAN) 8 MG tablet Take 1 tablet (8 mg total) by mouth every 8 (eight) hours as needed for nausea or vomiting. (Patient not taking: Reported on 10/21/2019) 20 tablet 1  . prochlorperazine (COMPAZINE) 10 MG tablet Take 1 tablet (10 mg  total) by mouth every 6 (six) hours as needed for nausea or vomiting. (Patient not taking: Reported on 10/21/2019) 30 tablet 1   No current facility-administered medications for this encounter.    REVIEW OF SYSTEMS:  Notable for that above.   PHYSICAL EXAM:  vitals were not taken for this visit.   General: Alert and oriented, in no acute distress      LABORATORY DATA:  Lab Results  Component Value Date   WBC 11.1 (H) 10/05/2019   HGB 12.9 (L) 10/05/2019   HCT 37.2 (L) 10/05/2019   MCV 93.5 10/05/2019   PLT 339 10/05/2019   CMP     Component Value Date/Time   NA 141 10/05/2019 0906   K 3.7 10/05/2019 0906   CL 103 10/05/2019 0906   CO2 28 10/05/2019 0906   GLUCOSE 154 (H) 10/05/2019 0906   BUN 17 10/05/2019 0906   CREATININE 0.88 10/05/2019 0906   CREATININE 0.86 01/11/2012 1715   CALCIUM 9.2 10/05/2019 0906   PROT 6.1 (L) 10/05/2019 0906   ALBUMIN 3.2 (L) 10/05/2019 0906   AST 14 (L) 10/05/2019 0906   ALT 15 10/05/2019 0906   ALKPHOS 62 10/05/2019 0906   BILITOT 0.5 10/05/2019 0906   GFRNONAA >60 10/05/2019 0906   GFRAA >60 10/05/2019 0906      Lab  Results  Component Value Date   TSH 0.59 02/23/2015     RADIOGRAPHY: As above, I personally reviewed his images  IMPRESSION/PLAN:  This is a delightful patient with head and neck cancer.  Upon finishing chemotherapy he understands he will need a restaging PET scan.  After chemotherapy I recommend involved site radiotherapy for this patient.  He understands this treatment will take approximately 4 weeks, possibly less.  We discussed the potential risks, benefits, and side effects of radiotherapy. We talked in detail about acute and late effects. We discussed that some of the most bothersome acute effects may be mucositis, dysgeusia, salivary changes, skin irritation, hair loss, fatigue.  We talked about measures he can take to maximize his oral hygiene.  This treatment is unlikely to cause tooth loss, which is a concern  of his. No guarantees of treatment were given.  The patient is enthusiastic about proceeding with treatment. I look forward to participating in the patient's care.    Simulation (treatment planning) will take place after his PET scan and completion of chemotherapy  We also discussed that the treatment of head and neck cancer is a multidisciplinary process to maximize treatment outcomes and quality of life. For this reason the following referrals have been or will be made:   Nutritionist for nutrition support during and after treatment.  I will also refer him to social work for social support.     This encounter was provided by telemedicine platform by telephone.  The patient opted for telemedicine to maximize safety during the pandemic.  MyChart video was not obtainable. The patient has given verbal consent for this type of encounter and has been advised to only accept a meeting of this type in a secure network environment. On date of service, in total, I spent 50 minutes on this encounter.  Also present was Harlow Asa RN, our Head and Neck Oncology Navigator. The attendants for this meeting include Eppie Gibson  and Arville Care.  During the encounter, Eppie Gibson was located at [facility] Radiation Oncology Department.  Arville Care was located at home.  __________________________________________   Eppie Gibson, MD   This document serves as a record of services personally performed by Eppie Gibson, MD. It was created on her behalf by Wilburn Mylar, a trained medical scribe. The creation of this record is based on the scribe's personal observations and the provider's statements to them. This document has been checked and approved by the attending provider.

## 2019-10-21 NOTE — Progress Notes (Signed)
Oncology Nurse Navigator Documentation  Spoke with patient during initial telephone consult with Dr. Isidore Moos.     . Further introduced myself as his Navigator, explained my role as a member of the Care Team.   . Provided introductory explanation of radiation treatment including SIM planning and purpose of Aquaplast head and shoulder mask. Discussed closed mask vs. open faced mask due to patients voiced claustrophobia.    . Discussed Nutrition referral placed by Dr. Isidore Moos. . I encouraged them to contact me with questions/concerns as treatments/procedures begin.    They verbalized understanding of information provided.     Harlow Asa, RN, BSN Head & Neck Oncology Nurse [place] [facility] [place] [phone]

## 2019-10-22 ENCOUNTER — Other Ambulatory Visit: Payer: Self-pay | Admitting: *Deleted

## 2019-10-22 ENCOUNTER — Other Ambulatory Visit: Payer: Self-pay | Admitting: Hematology and Oncology

## 2019-10-22 ENCOUNTER — Encounter: Payer: Self-pay | Admitting: General Practice

## 2019-10-22 MED ORDER — PREDNISONE 20 MG PO TABS: 100.0000 mg | ORAL_TABLET | ORAL | 4 refills | Status: DC

## 2019-10-22 MED ORDER — ETOPOSIDE 50 MG PO CAPS: 100.0000 mg/m2/d | ORAL_CAPSULE | Freq: Every day | ORAL | 1 refills | Status: AC

## 2019-10-22 NOTE — Progress Notes (Signed)
[place] Initial Psychosocial Assessment Clinical Social Work  Clinical Social Work contacted by phone to assess psychosocial, emotional, mental health, and spiritual needs of the patient.   Barriers to care/review of distress screen:  - Transportation:  Do you anticipate any problems getting to appointments?  Do you have someone who can help run errands for you if you need it?  Wife drives, he does not.  He does not anticipate any issues.   - Help at home:  What is your living situation (alone, family, other)?  If you are physically unable to care for yourself, who would you call on to help you?  Lives w wife.   - Support system:  What does your support system look like?  Who would you call on if you needed some kind of practical help?  What if you needed someone to talk to for emotional support? Unable to assess.  - Finances:  Are you concerned about finances.  Considering returning to work?  If not, applying for disability?  Unable to assess.     CSW Summary:  Patient and family psychosocial functioning including strengths, limitations, and coping skills: 79 year old male, newly diagnosed with non Hodgkins lymphoma.  He states he is doing well, has no need of help from social work at this time.  Wife drives, he does not.  He would like to not put "too much" on her, but declined referral to [name]. Major concern today was getting medications for upcoming chemotherapy.  Nurse navigator notified and she will assist as appropriat  Clinical Social Worker follow up needed: No. Edwyna Shell, [place] Social Worker Phone:  [phone] Cell:  [phone]

## 2019-10-22 NOTE — Progress Notes (Signed)
.  et

## 2019-10-23 ENCOUNTER — Other Ambulatory Visit: Payer: Self-pay

## 2019-10-23 DIAGNOSIS — I10 Essential (primary) hypertension: Secondary | ICD-10-CM

## 2019-10-23 DIAGNOSIS — R69 Illness, unspecified: Secondary | ICD-10-CM | POA: Diagnosis not present

## 2019-10-23 DIAGNOSIS — E785 Hyperlipidemia, unspecified: Secondary | ICD-10-CM

## 2019-10-23 MED FILL — predniSONE 20 MG TABS: 20 | 5 days supply | Qty: 25 | Fill #0

## 2019-10-23 MED FILL — ETOPOSIDE 50 MG CAPSULE: 50 | 3 days supply | Qty: 8 | Fill #0

## 2019-10-23 NOTE — Telephone Encounter (Signed)
Referral has been placed. 

## 2019-10-25 ENCOUNTER — Other Ambulatory Visit: Payer: Self-pay | Admitting: Hematology and Oncology

## 2019-10-25 DIAGNOSIS — C8331 Diffuse large B-cell lymphoma, lymph nodes of head, face, and neck: Secondary | ICD-10-CM

## 2019-10-26 ENCOUNTER — Inpatient Hospital Stay (HOSPITAL_BASED_OUTPATIENT_CLINIC_OR_DEPARTMENT_OTHER): Payer: Medicare HMO | Admitting: Hematology and Oncology

## 2019-10-26 ENCOUNTER — Inpatient Hospital Stay: Payer: Medicare HMO

## 2019-10-26 ENCOUNTER — Encounter: Payer: Self-pay | Admitting: Hematology and Oncology

## 2019-10-26 ENCOUNTER — Other Ambulatory Visit: Payer: Self-pay

## 2019-10-26 VITALS — BP 107/66 | HR 63 | Temp 97.7°F | Resp 18

## 2019-10-26 VITALS — BP 118/74 | HR 60 | Temp 97.8°F | Resp 18 | Ht 70.0 in | Wt 186.5 lb

## 2019-10-26 DIAGNOSIS — C8331 Diffuse large B-cell lymphoma, lymph nodes of head, face, and neck: Secondary | ICD-10-CM

## 2019-10-26 DIAGNOSIS — Z5112 Encounter for antineoplastic immunotherapy: Secondary | ICD-10-CM | POA: Diagnosis not present

## 2019-10-26 DIAGNOSIS — Z95828 Presence of other vascular implants and grafts: Secondary | ICD-10-CM

## 2019-10-26 LAB — CBC WITH DIFFERENTIAL (CANCER CENTER ONLY)
Abs Immature Granulocytes: 0.17 10*3/uL — ABNORMAL HIGH (ref 0.00–0.07)
Basophils Absolute: 0.1 10*3/uL (ref 0.0–0.1)
Basophils Relative: 1 %
Eosinophils Absolute: 0.1 10*3/uL (ref 0.0–0.5)
Eosinophils Relative: 1 %
HCT: 36.4 % — ABNORMAL LOW (ref 39.0–52.0)
Hemoglobin: 12.5 g/dL — ABNORMAL LOW (ref 13.0–17.0)
Immature Granulocytes: 1 %
Lymphocytes Relative: 3 %
Lymphs Abs: 0.4 10*3/uL — ABNORMAL LOW (ref 0.7–4.0)
MCH: 32.4 pg (ref 26.0–34.0)
MCHC: 34.3 g/dL (ref 30.0–36.0)
MCV: 94.3 fL (ref 80.0–100.0)
Monocytes Absolute: 0.4 10*3/uL (ref 0.1–1.0)
Monocytes Relative: 3 %
Neutro Abs: 13.9 10*3/uL — ABNORMAL HIGH (ref 1.7–7.7)
Neutrophils Relative %: 91 %
Platelet Count: 262 10*3/uL (ref 150–400)
RBC: 3.86 MIL/uL — ABNORMAL LOW (ref 4.22–5.81)
RDW: 14.5 % (ref 11.5–15.5)
WBC Count: 15.1 10*3/uL — ABNORMAL HIGH (ref 4.0–10.5)
nRBC: 0 % (ref 0.0–0.2)

## 2019-10-26 LAB — CMP (CANCER CENTER ONLY)
ALT: 12 U/L (ref 0–44)
AST: 11 U/L — ABNORMAL LOW (ref 15–41)
Albumin: 3.3 g/dL — ABNORMAL LOW (ref 3.5–5.0)
Alkaline Phosphatase: 66 U/L (ref 38–126)
Anion gap: 9 (ref 5–15)
BUN: 13 mg/dL (ref 8–23)
CO2: 27 mmol/L (ref 22–32)
Calcium: 8.9 mg/dL (ref 8.9–10.3)
Chloride: 106 mmol/L (ref 98–111)
Creatinine: 0.92 mg/dL (ref 0.61–1.24)
GFR, Est AFR Am: >60 mL/min (ref >60)
GFR, Estimated: >60 mL/min (ref >60)
Glucose, Bld: 138 mg/dL — ABNORMAL HIGH (ref 70–99)
Potassium: 3.8 mmol/L (ref 3.5–5.1)
Sodium: 142 mmol/L (ref 135–145)
Total Bilirubin: 0.5 mg/dL (ref 0.3–1.2)
Total Protein: 6 g/dL — ABNORMAL LOW (ref 6.5–8.1)

## 2019-10-26 LAB — LACTATE DEHYDROGENASE: LDH: 161 U/L (ref 98–192)

## 2019-10-26 LAB — MAGNESIUM: Magnesium: 1.8 mg/dL (ref 1.7–2.4)

## 2019-10-26 MED ORDER — SODIUM CHLORIDE 0.9% FLUSH
10.0000 mL | INTRAVENOUS | Status: DC | PRN
  Administered 2019-10-26: 10 mL
  Filled 2019-10-26: qty 10

## 2019-10-26 MED ORDER — SODIUM CHLORIDE 0.9 % IV SOLN
750.0000 mg/m2 | Freq: Once | INTRAVENOUS | Status: AC
  Administered 2019-10-26: 1540 mg via INTRAVENOUS
  Filled 2019-10-26: qty 77

## 2019-10-26 MED ORDER — SODIUM CHLORIDE 0.9 % IV SOLN
375.0000 mg/m2 | Freq: Once | INTRAVENOUS | Status: AC
  Administered 2019-10-26: 800 mg via INTRAVENOUS
  Filled 2019-10-26: qty 30

## 2019-10-26 MED ORDER — HEPARIN SOD (PORK) LOCK FLUSH 100 UNIT/ML IV SOLN
500.0000 [IU] | Freq: Once | INTRAVENOUS | Status: AC | PRN
  Administered 2019-10-26: 500 [IU]
  Filled 2019-10-26: qty 5

## 2019-10-26 MED ORDER — SODIUM CHLORIDE 0.9 % IV SOLN
150.0000 mg | Freq: Once | INTRAVENOUS | Status: AC
  Administered 2019-10-26: 150 mg via INTRAVENOUS
  Filled 2019-10-26: qty 150

## 2019-10-26 MED ORDER — ACETAMINOPHEN 325 MG PO TABS
ORAL_TABLET | ORAL | Status: AC
  Filled 2019-10-26: qty 2

## 2019-10-26 MED ORDER — VINCRISTINE SULFATE CHEMO INJECTION 1 MG/ML
2.0000 mg | Freq: Once | INTRAVENOUS | Status: AC
  Administered 2019-10-26: 13:00:00 2 mg via INTRAVENOUS
  Filled 2019-10-26: qty 2

## 2019-10-26 MED ORDER — SODIUM CHLORIDE 0.9 % IV SOLN
10.0000 mg | Freq: Once | INTRAVENOUS | Status: AC
  Administered 2019-10-26: 10 mg via INTRAVENOUS
  Filled 2019-10-26: qty 10

## 2019-10-26 MED ORDER — ACETAMINOPHEN 325 MG PO TABS
650.0000 mg | ORAL_TABLET | Freq: Once | ORAL | Status: AC
  Administered 2019-10-26: 650 mg via ORAL

## 2019-10-26 MED ORDER — SODIUM CHLORIDE 0.9 % IV SOLN
Freq: Once | INTRAVENOUS | Status: AC
  Filled 2019-10-26: qty 250

## 2019-10-26 MED ORDER — SODIUM CHLORIDE 0.9 % IV SOLN
50.0000 mg/m2 | Freq: Once | INTRAVENOUS | Status: AC
  Administered 2019-10-26: 100 mg via INTRAVENOUS
  Filled 2019-10-26: qty 5

## 2019-10-26 MED ORDER — DIPHENHYDRAMINE HCL 25 MG PO CAPS
50.0000 mg | ORAL_CAPSULE | Freq: Once | ORAL | Status: AC
  Administered 2019-10-26: 50 mg via ORAL

## 2019-10-26 MED ORDER — PALONOSETRON HCL INJECTION 0.25 MG/5ML
INTRAVENOUS | Status: AC
  Filled 2019-10-26: qty 5

## 2019-10-26 MED ORDER — DIPHENHYDRAMINE HCL 25 MG PO CAPS
ORAL_CAPSULE | ORAL | Status: AC
  Filled 2019-10-26: qty 2

## 2019-10-26 MED ORDER — PALONOSETRON HCL INJECTION 0.25 MG/5ML
0.2500 mg | Freq: Once | INTRAVENOUS | Status: AC
  Administered 2019-10-26: 0.25 mg via INTRAVENOUS

## 2019-10-26 NOTE — Patient Instructions (Signed)
[place] Discharge Instructions for Patients Receiving Chemotherapy  Today you received the following chemotherapy agents: Vincristine, Cytoxan, Etoposide, Rituxumab  To help prevent nausea and vomiting after your treatment, we encourage you to take your nausea medication as directed by your MD.   If you develop nausea and vomiting that is not controlled by your nausea medication, call the clinic.   BELOW ARE SYMPTOMS THAT SHOULD BE REPORTED IMMEDIATELY:  *FEVER GREATER THAN 100.5 F  *CHILLS WITH OR WITHOUT FEVER  NAUSEA AND VOMITING THAT IS NOT CONTROLLED WITH YOUR NAUSEA MEDICATION  *UNUSUAL SHORTNESS OF BREATH  *UNUSUAL BRUISING OR BLEEDING  TENDERNESS IN MOUTH AND THROAT WITH OR WITHOUT PRESENCE OF ULCERS  *URINARY PROBLEMS  *BOWEL PROBLEMS  UNUSUAL RASH Items with * indicate a potential emergency and should be followed up as soon as possible.  Feel free to call the clinic should you have any questions or concerns. The clinic phone number is [phone].  Please show the [place] at check-in to the Emergency Department and triage nurse.  Coronavirus (COVID-19) Are you at risk?  Are you at risk for the Coronavirus (COVID-19)?  To be considered HIGH RISK for Coronavirus (COVID-19), you have to meet the following criteria:  . Traveled to [place], [place], [place], [place] or [place]; or in the [place] to [place], [place], [place], or [place]; and have fever, cough, and shortness of breath within the last 2 weeks of travel OR . Been in close contact with a person diagnosed with COVID-19 within the last 2 weeks and have fever, cough, and shortness of breath . IF YOU DO NOT MEET THESE CRITERIA, YOU ARE CONSIDERED LOW RISK FOR COVID-19.  What to do if you are HIGH RISK for COVID-19?  Marland Kitchen If you are having a medical emergency, call 911. . Seek medical care right away. Before you go to a doctor's office, urgent care or emergency  department, call ahead and tell them about your recent travel, contact with someone diagnosed with COVID-19, and your symptoms. You should receive instructions from your physician's office regarding next steps of care.  . When you arrive at healthcare provider, tell the healthcare staff immediately you have returned from visiting [place], [place], [place], [place] or [place]; or traveled in the [place] to [place], [place], [place], or [place]; in the last two weeks or you have been in close contact with a person diagnosed with COVID-19 in the last 2 weeks.   . Tell the health care staff about your symptoms: fever, cough and shortness of breath. . After you have been seen by a medical provider, you will be either: o Tested for (COVID-19) and discharged home on quarantine except to seek medical care if symptoms worsen, and asked to  - Stay home and avoid contact with others until you get your results (4-5 days)  - Avoid travel on public transportation if possible (such as bus, train, or airplane) or o Sent to the Emergency Department by EMS for evaluation, COVID-19 testing, and possible admission depending on your condition and test results.  What to do if you are LOW RISK for COVID-19?  Reduce your risk of any infection by using the same precautions used for avoiding the common cold or flu:  Marland Kitchen Wash your hands often with soap and warm water for at least 20 seconds.  If soap and water are not readily available, use an alcohol-based hand sanitizer with at least 60% alcohol.  Marland Kitchen  If coughing or sneezing, cover your mouth and nose by coughing or sneezing into the elbow areas of your shirt or coat, into a tissue or into your sleeve (not your hands). . Avoid shaking hands with others and consider head nods or verbal greetings only. . Avoid touching your eyes, nose, or mouth with unwashed hands.  . Avoid close contact with people who are sick. . Avoid places or events with large numbers of people  in one location, like concerts or sporting events. . Carefully consider travel plans you have or are making. . If you are planning any travel outside or inside the [place], visit the CDC's Travelers' Health webpage for the latest health notices. . If you have some symptoms but not all symptoms, continue to monitor at home and seek medical attention if your symptoms worsen. . If you are having a medical emergency, call 911.   ADDITIONAL HEALTHCARE OPTIONS FOR PATIENTS  [facility] / e-Visit: [email or web address]         [facility]: [phone]  [facility]: [phone]                   [facility]: [phone]   

## 2019-10-26 NOTE — Progress Notes (Signed)
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 Lucia Gaskins for tonsillar mass present for a few weeks. 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 FISH studies pending.  2) 08/17/2019: establish care with Dr. Lorenso Courier   3) 08/28/2019: PET CT scan reveals activity in left lingual tonsil, level 2 lymph node, and level 3 lymph node. Consistent with Stage I 4) 09/04/2019: Bone marrow biopsy shows no evidence of lymphoma in the bone marrow.  5) 09/04/2019; TTE showed EF 60-65%, however patient found to have new atrial fibrillation.  6) 09/14/2019: started R-CEOP chemotherapy, Cycle 1 Day 1 7) 10/05/2019: R-CEOP chemotherapy, Cycle 2 Day 1 8) 10/26/2019: R-CEOP chemotherapy Cycle 3 Day 1  HISTORY OF PRESENTING ILLNESS:  Charles Li 79 y.o. male with medical history significant for left tonsillar B cell lymphoma presents for f/u. He was last seen on 10/05/2019 in our clinic. In the interim he has had completed Cycle 2 of R-CEOP therapy and presents for Cycle 3 to start today.    On exam today Charles Li notes that he has tolerated chemotherapy incredibly well so far.  He notes that he feels fine and that his only complaint is some mild fatigue.  He reports he is not having any issues with nausea, vomiting, diarrhea, constipation.  He reports that his appetite has been okay and that he has been eating whenever he wants to eat.  He notes that he has been ambulating without difficulty and tends to walk  around 1 city block for exercise.  He notes that over the weekend he walked in the [place].  He notes that he is not having any issues with shortness of breath or palpitations of the chest.  He already lost all of his hair and is completely bald at this time and wears a small cap.  He denies having any pain in the back of his throat and he has no additional questions or concerns today.  A full 10 point ROS is listed below.  MEDICAL HISTORY:  Past Medical History:  Diagnosis Date  . Abdominal pain, epigastric 07/14/2009  . Abdominal wall hernia   . ALLERGIC RHINITIS 12/12/2006  . ASTHMA 11/28/2007  . 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 Diffuse large B cell lymphoma ([place])   . GLAUCOMA 12/12/2006  . 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 HERNIA, VENTRAL 01/04/2009  . HYPERLIPIDEMIA 11/28/2007  . HYPERTENSION 12/12/2006  . Leg swelling 04-24-12   occ., none at present  . Persistent atrial fibrillation ([place])   . RENAL CALCULUS 03/24/2010  . VENOUS INSUFFICIENCY 01/18/2010  . Visual disturbance 04-24-12   legally blind    ALLERGIES:  is allergic to dorzolamide; acetazolamide; bimatoprost; brimonidine; sulfa antibiotics; sulfamethoxazole; dorzolamide hcl-timolol mal; erythromycin; penicillins; and sulfonamide derivatives.  MEDICATIONS:  Current Outpatient Medications  Medication Sig Dispense Refill  . amLODipine (NORVASC) 5 MG tablet Take 1 tablet (5 mg total) by mouth daily. 90 tablet 3  . benazepril-hydrochlorthiazide (LOTENSIN HCT) 20-12.5 MG tablet Take 1 tablet by mouth daily. 90 tablet 3  . desonide (DESOWEN) 0.05 % ointment Apply sparingly to lids as  needed for dermatitis.    Marland Kitchen dorzolamide-timolol (COSOPT) 22.3-6.8 MG/ML ophthalmic solution Place 1 drop into both eyes 2 times daily.    Derrill Memo ON 10/27/2019] etoposide (VEPESID) 50 MG capsule Take 4 capsules (200 mg total) by mouth daily for 2 doses. Take 4 capsules on Day 2 and 4 capsules on Day 3 of chemotherapy. 8 capsule 1  . ibuprofen (ADVIL) 200  MG tablet Take 200 mg by mouth every 6 (six) hours as needed.    . Multiple Vitamin (MULTIVITAMIN) capsule Take 1 capsule by mouth daily.    . Netarsudil-Latanoprost (ROCKLATAN) 0.02-0.005 % SOLN Place 1 drop into both eyes daily.    . ondansetron (ZOFRAN) 8 MG tablet Take 1 tablet (8 mg total) by mouth every 8 (eight) hours as needed for nausea or vomiting. (Patient not taking: Reported on 10/21/2019) 20 tablet 1  . pantoprazole (PROTONIX) 40 MG tablet Take 1 tablet (40 mg total) by mouth daily. For stomach protection during chemotherapy 90 tablet 1  . pravastatin (PRAVACHOL) 40 MG tablet Take 1 tablet (40 mg total) by mouth daily. Please schedule an appointment for further refills. [phone] 90 tablet 3  . predniSONE (DELTASONE) 20 MG tablet Take 5 tablets (100 mg total) by mouth as directed. Take 5 tablets per day on Days 1 through 5 of chemotherapy 25 tablet 4  . prochlorperazine (COMPAZINE) 10 MG tablet Take 1 tablet (10 mg total) by mouth every 6 (six) hours as needed for nausea or vomiting. (Patient not taking: Reported on 10/21/2019) 30 tablet 1   No current facility-administered medications for this visit.    REVIEW OF SYSTEMS:   Constitutional: ( - ) fevers, ( - )  chills , ( - ) night sweats (+) hair loss Eyes: ( - ) blurriness of vision, ( - ) double vision, ( - ) watery eyes Ears, nose, mouth, throat, and face: ( - ) mucositis, ( + ) sore throat Respiratory: ( - ) cough, ( - ) dyspnea, ( - ) wheezes Cardiovascular: ( - ) palpitation, ( - ) chest discomfort, ( - ) lower extremity swelling Gastrointestinal:  ( - ) nausea, ( - ) heartburn, ( - ) change in bowel habits Skin: ( - ) abnormal skin rashes Lymphatics: ( - ) new lymphadenopathy, ( - ) easy bruising Neurological: ( - ) numbness, ( - ) tingling, ( - ) new weaknesses Behavioral/Psych: ( - ) mood change, ( - ) new changes  All other systems were reviewed with the patient and are negative.  PHYSICAL EXAMINATION: ECOG  PERFORMANCE STATUS: 1 - Symptomatic but completely ambulatory  Vitals:   10/26/19 1037  BP: 118/74  Pulse: 60  Resp: 18  Temp: 97.8 F (36.6 C)  SpO2: 99%   Filed Weights   10/26/19 1037  Weight: 186 lb 8 oz (84.6 kg)    GENERAL: well appearing elderly Caucasian male in NAD, balding with considerably thinner hair on his head  SKIN: skin color, texture, turgor are normal, no rashes or significant lesions EYES: conjunctiva are pink and non-injected, sclera clear OROPHARYNX:mouth is without erythema or exudate. No clear lesion or tonsillar mass LUNGS: clear to auscultation and percussion with normal breathing effort HEART: regular rate & rhythm and no murmurs and no lower extremity edema Musculoskeletal: no cyanosis of digits and no clubbing  PSYCH: alert & oriented x 3, fluent speech NEURO: no focal motor/sensory deficits  LABORATORY DATA:  I have reviewed the data as listed CBC Latest Ref Rng &  Units 10/26/2019 10/05/2019 09/21/2019  WBC 4.0 - 10.5 K/uL 15.1(H) 11.1(H) 3.2(L)  Hemoglobin 13.0 - 17.0 g/dL 12.5(L) 12.9(L) 13.6  Hematocrit 39.0 - 52.0 % 36.4(L) 37.2(L) 38.0(L)  Platelets 150 - 400 K/uL 262 339 72(L)    CMP Latest Ref Rng & Units 10/26/2019 10/05/2019 09/21/2019  Glucose 70 - 99 mg/dL 138(H) 154(H) 116(H)  BUN 8 - 23 mg/dL 13 17 25(H)  Creatinine 0.61 - 1.24 mg/dL 0.92 0.88 0.90  Sodium 135 - 145 mmol/L 142 141 139  Potassium 3.5 - 5.1 mmol/L 3.8 3.7 3.7  Chloride 98 - 111 mmol/L 106 103 101  CO2 22 - 32 mmol/L 27 28 29   Calcium 8.9 - 10.3 mg/dL 8.9 9.2 8.7(L)  Total Protein 6.5 - 8.1 g/dL 6.0(L) 6.1(L) 5.7(L)  Total Bilirubin 0.3 - 1.2 mg/dL 0.5 0.5 0.9  Alkaline Phos 38 - 126 U/L 66 62 64  AST 15 - 41 U/L 11(L) 14(L) 9(L)  ALT 0 - 44 U/L 12 15 14     PATHOLOGY:  SURGICAL PATHOLOGY  CASE: [id number]  PATIENT: Charles Li  Surgical Pathology Report   Clinical History: tonsil cancer (cm)   FINAL MICROSCOPIC DIAGNOSIS:   A. TONSIL, LEFT, BIOPSY:    - Large B-cell lymphoma  - See comment   COMMENT:  The tonsillar tissue has an ulcerated surface with underlying atypical  cellular proliferation. By immunohistochemistry, the neoplastic cells  are positive for CD20, CD10, BCL6 and BCL2 (weak) but negative for CD3,  CD56, CD5, Mum-1, cytokeratin AE1/3 and EBV by in situ hybridization.  The proliferative rate by Ki-67 is 70 to 80%. Overall, the features are  consistent with a large B-cell lymphoma. The differential diagnosis  includes diffuse large B-cell lymphoma and high-grade B-cell lymphoma  (double hit). FISH is pending and will be reported in an addendum.   Preliminary results of this case were given to Dr. Lucia Gaskins on August 14, 2019.   Dr. Tresa Moore reviewed the case and agrees with the above diagnosis.   GROSS DESCRIPTION:   The specimen is received in formalin and consists of a 0.9 x 0.4 x 0.4  cm piece of tan soft tissue. The specimen is entirely submitted in 1  cassette. Craig Staggers 08/14/2019)   Final Diagnosis performed by Thressa Sheller, MD.  Electronically signed  08/14/2019  Technical and / or Professional components performed at [facility]. [facility], [place] [address] [place], [place] [address].  Immunohistochemistry Technical component (if applicable) was performed  at [facility]. [address], [place],  [place],  [address].  IMMUNOHISTOCHEMISTRY DISCLAIMER (if applicable):  Some of these immunohistochemical stains may have been developed and the  performance characteristics determine by [facility]. Some  may not have been cleared or approved by the U.S. Food and Drug  Administration. The FDA has determined that such clearance or approval  is not necessary. This test is used for clinical purposes. It should not  be regarded as investigational or for research. This laboratory is  certified under the [place]  (CLIA-88) as  qualified to perform high complexity clinical laboratory  testing. The controls stained appropriately.  SURGICAL PATHOLOGY  ** THIS IS AN ADDENDUM REPORT **  CASE: [id number]  PATIENT: Charles Li  Bone Marrow Report  **Addendum **   Reason for Addendum #1: Cytogenetics results   Clinical History: lymphoma, DLBCL, left posterior iliac, (ADC)    DIAGNOSIS:   BONE MARROW, ASPIRATE, CLOT, CORE:  -  Normocellular marrow with trilineage hematopoiesis  - No lymphoma identified   PERIPHERAL BLOOD:  - Morphologically unremarkable  - See complete blood cell count   MICROSCOPIC DESCRIPTION:   PERIPHERAL BLOOD SMEAR: The peripheral blood is morphologically  unremarkable.   BONE MARROW ASPIRATE: Spicular, cellular and adequate for evaluation  Erythroid precursors: Orderly maturation without overt dysplasia  Granulocytic precursors: Orderly maturation without overt dysplasia  Megakaryocytes: Qualitatively and quantitatively unremarkable  Lymphocytes/plasma cells: No lymphocytosis or plasmacytosis   TOUCH PREPARATIONS: Hypocellular with no additional findings ascompared  to the aspirate smears.   CLOT AND BIOPSY: Examination of the core biopsy and clot section reveals  a normocellular bone marrow (30-40%) with trilineage hematopoiesis.  Myeloid and erythroid elements are present in essentially normal  proportions. Megakaryocytes display a spectrum of maturation without  clustering. There are no granulomas identified. A single well-formed  lymphoid aggregate is noted on the clot section. By  immunohistochemistry, is composed of an admixture of B and T cells  (CD20, CD3) without aberrant B-cell expression of CD5 or CD10.   IRON STAIN: Iron stains are performed on a bone marrow aspirate or touch  imprint smear and section of clot. The controls stained appropriately.     Storage Iron: Present    Ring Sideroblasts: Not identified   ADDITIONAL DATA/TESTING:  Cytogenetics is pending and will be reported  separately. Flow cytometry performed on the bone marrow biopsy did not  identify a monoclonal B-cell or phenotypically aberrant T-cell  population (See [id number]).   CELL COUNT DATA:   Bone Marrow count performed on 500 cells shows:  Blasts:  0%  Myeloid: 57%  Promyelocytes: 0%  Erythroid:   28%  Myelocytes:  10% Lymphocytes:  7%  Metamyelocytes:   2%  Plasma cells: 8%  Bands:  7%  Neutrophils:  33% M:E ratio:   2.03  Eosinophils:  5%  Basophils:   0%  Monocytes:   0%   Lab Data: CBC performed on 09/04/2019 shows:  WBC: 8.6 k/uL Neutrophils:  63%  Hgb: 14.2 g/dL Lymphocytes:  30%  HCT: 41.2 %  Monocytes:   3%  MCV: 94.7 fL  Eosinophils:  4%  RDW: 11.8 %  Basophils:   0%  PLT: 263 k/uL   GROSS DESCRIPTION:   A: Aspirate smear   B: Received in B-plus fixative are tissue fragments measuring 2.1 x 1.0  x 0.3 cm in aggregate. The specimen is submitted in total.   C: Received in B-plus fixative is a 2.0 x 0.2 cm core of bone which is  submitted in toto fine decalcification. [facility] 09/04/2019)    Final Diagnosis performed by Thressa Sheller, MD.  Electronically signed  09/08/2019  Technical component performed at [place]  [address], [place], [place] [address].  Professional component performed at [employer]. [facility],  [place] [address], [place], [place] [address].  Immunohistochemistry Technical component (if applicable) was performed  at [facility]. [address] [place],  [place], [place] [address].  IMMUNOHISTOCHEMISTRY DISCLAIMER (if applicable):  Some of these immunohistochemical stains may have been developed and the  performance characteristics determine by [facility]. Some  may not have been cleared or approved by the U.S. Food and Drug  Administration. The FDA has determined that such clearance or approval  is  not necessary. This test is used for clinical purposes. It should not  be regarded as investigational or for research. This laboratory is  certified under the [place]  Laboratory Improvement Amendments of 1988  (CLIA-88) as qualified to perform high complexity clinical laboratory  testing. The controls stained appropriately.   ADDENDUM:   ** Please note this testing was performed and interpreted by an outside  facility. This addendum is only being added to provide a summary of the  results for report completeness. Please see electronic medical record  for a copy of the full report. **   Karyotype: 47,?XY,?del(?9)?(?q13q22)?,?+?mar[?3]?/?46,?XY[?20]?  Interpretation: ABNORMAL MALE KARYOTYPE   Cytogenetic analysis shows an abnormal male karyotype. 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 The remaining twenty cells show a normal  karyotype.   Deletion of chromosome 9q occurs in both myeloid (more common) and  lymphoid disorders, including acute myeloid leukemia (AML) (more  commonly) and myelodysplastic syndrome (MDS) (only rarely). In MDS the  prognostic significance would be expected to be intermediate.  In AML  the prognosis is variable. Marker chromosomes and structural  chromosomal aberrations in the absence of autosomal monosomies have  minimal prognostic impact. Correlation with other clinical and  laboratory findings is indicated.    RADIOGRAPHIC STUDIES:  No results found.  ASSESSMENT & PLAN Charles Li 79 y.o. 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   In the interim since his last visit the patient has completed cycle 2 of R-CEOP.  So far the patient is tolerating the treatment well with no adverse side effects other than alopecia and some mild constipation.  He denies having any nausea, vomiting, or diarrhea.  He does not currently  have any focal symptoms.  The treatment plan is for Rituximab 375 mg/m2, Cyclophosphamide 753m/m2, etoposide IV 589mm2 (followed by 10058m2 PO on Day 2 and 3), and Vincristine 1.4 mg/m2 all on Day 1. The patient will also received PO prednisone 100m33my 1-5 of each 3 week cycle. Will tentatively plan for 3 cycles followed by ISRT vs 4-6 cycles if no ISRT.   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 Bone marrow biopsy confirmed no alternative sites of lymphoma.  -- started R-CEOP chemotherapy on 09/14/2019. This is therapy with curative intent. Etoposide was substituted for anthracycline therapy due to his atrial fibrillation found on TTE.  -- At this time his findings are consistent with a Stage I. Plan is for 3 cycles of R-CEOP with ISRT to site of disease assuming interval PET scan shows no active disease. Patient established with Rad/Onc for consideration of ISRT.  --PET CT scan to be performed in 2-3 weeks. If no signs of residual disease patient will undergo radiation treatment with Dr. SquiIsidore Moos-plan to have patient RTC 11/23/2019 after the completion of Cycle 3 chemotherapy to review PET CT and assure stable labs.   #Oropharynx Pain, resolved --continue taking ibuprofen PRN --patient has oxycodone 5-10mg45m PRN available for throat pain, though he has not needed this --no improvement viscous lidocaine, patient noted that regular mouthwash provided more pain relief.  --provided patient with senna-docusate to assure these medications do not cause him constipation. --continue to monitor  #Symptom Management --will assure patient has zofran 8mg P110m8H PRN and compazine 25mg q9mRN. 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   #Atrial Fibrillation, new --currently working with cardiology for recommendations regarding anticoagulation --anticoagulation therapy OK as long as patient Plts >50k   No orders of the defined types were placed in this encounter.   All questions were answered. The patient knows to call the clinic with any problems, questions or concerns.  A total of more than 30 minutes were spent on this encounter and over half of that time was spent on counseling and coordination of care as outlined above.   Ledell Peoples, MD Department of Hematology/Oncology [place] [facility] Phone: [phone] Pager: [phone] Email: [email or web address]  10/26/2019 11:12 AM

## 2019-10-27 ENCOUNTER — Other Ambulatory Visit: Payer: Self-pay | Admitting: Family Medicine

## 2019-10-27 ENCOUNTER — Inpatient Hospital Stay: Payer: Medicare HMO | Admitting: Nutrition

## 2019-10-27 NOTE — Progress Notes (Signed)
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  He is a patient of Dr. Lorenso Courier and Dr. Isidore Moos.  Past medical history includes legal blindness, atrial fibrillation, hypertension, hernia, hyperlipidemia, decreased hearing, and abdominal pain.  Medications include multivitamin, Zofran, Protonix, prednisone, and Compazine.  Labs include glucose 154 and albumin 3.2 on March 29.  Height: 5 feet 10 inches. Weight: 186.5 pounds on April 18. Usual body weight: 193 pounds in January 2021. BMI: 26.76.  Patient will reports his weight loss was intentional.  He reports he has been trying to decrease his oral intake since January.   Reports his wife does not cook a lot of meals and they are in the process of having a kitchen remodel. He is not concerned about his weight loss. He likes to eat breakfast lunch and dinner and would like to incorporate more vegetables.  Nutrition diagnosis: Food and nutrition related knowledge deficit related to B-cell lymphoma as evidenced by no prior need for nutrition related information.  Intervention: Educated patient on the importance of weight maintenance throughout treatment. Reviewed healthy plant-based diet and encouraged added protein. Recommended 3 meals with 2 snacks daily. Brief education provided on appropriate snacks. Provided written information which will be mailed to patient's home.  Questions were answered.  Teach back method used.  Monitoring, evaluation, goals: Patient will tolerate adequate calories and protein to minimize further weight loss.  Next visit: Patient prefers to contact me for follow-up.

## 2019-10-28 ENCOUNTER — Telehealth: Payer: Self-pay | Admitting: Hematology and Oncology

## 2019-10-28 ENCOUNTER — Telehealth: Payer: Self-pay | Admitting: *Deleted

## 2019-10-28 ENCOUNTER — Other Ambulatory Visit: Payer: Self-pay | Admitting: Hematology and Oncology

## 2019-10-28 MED ORDER — LORAZEPAM 1 MG PO TABS: 1.0000 mg | ORAL_TABLET | Freq: Once | ORAL | 0 refills | Status: AC

## 2019-10-28 MED FILL — LORazepam 1 MG TABS: 1 | 1 days supply | Qty: 1 | Fill #0

## 2019-10-28 NOTE — Telephone Encounter (Signed)
Received call from patient to inform Dr. Lorenso Courier that he has his PET scan scheduled for 11/09/19 @ 7:30 am. He is asking for lorazepam 1 mg tablet to take 30-60 minutes prior to scan. Dr. Lorenso Courier made aware and this was escribed to [facility].  Pt will pick up next week.

## 2019-10-28 NOTE — Telephone Encounter (Signed)
Scheduled per los. Called and spoke with patient. Confirmed appt 

## 2019-10-28 NOTE — Progress Notes (Signed)
Ativan prescription for pre-med prior to PET CT scan.   Ledell Peoples, MD Department of Hematology/Oncology [place] at [facility] Phone: [phone] Pager: [phone] Email: [email or web address]

## 2019-10-29 ENCOUNTER — Other Ambulatory Visit: Payer: Self-pay

## 2019-10-29 ENCOUNTER — Ambulatory Visit: Payer: Medicare HMO

## 2019-10-29 NOTE — Chronic Care Management (AMB) (Signed)
At beginning of visit, patient stated not interested in CCM. Patient declined and only wants to continue working with provider.   Patient unenrolled from CCM.

## 2019-10-30 ENCOUNTER — Other Ambulatory Visit: Payer: Self-pay

## 2019-10-30 ENCOUNTER — Inpatient Hospital Stay: Payer: Medicare HMO

## 2019-10-30 VITALS — BP 134/79 | HR 56 | Temp 98.0°F | Resp 18

## 2019-10-30 DIAGNOSIS — Z5112 Encounter for antineoplastic immunotherapy: Secondary | ICD-10-CM | POA: Diagnosis not present

## 2019-10-30 DIAGNOSIS — C8331 Diffuse large B-cell lymphoma, lymph nodes of head, face, and neck: Secondary | ICD-10-CM

## 2019-10-30 MED ORDER — PEGFILGRASTIM-JMDB 6 MG/0.6ML ~~LOC~~ SOSY
PREFILLED_SYRINGE | SUBCUTANEOUS | Status: AC
  Filled 2019-10-30: qty 0.6

## 2019-10-30 MED ORDER — PEGFILGRASTIM-JMDB 6 MG/0.6ML ~~LOC~~ SOSY
6.0000 mg | PREFILLED_SYRINGE | Freq: Once | SUBCUTANEOUS | Status: AC
  Administered 2019-10-30: 6 mg via SUBCUTANEOUS

## 2019-10-30 NOTE — Patient Instructions (Signed)

## 2019-11-06 MED FILL — LORazepam 1 MG TABS: 1 | 1 days supply | Qty: 1 | Fill #0

## 2019-11-09 ENCOUNTER — Ambulatory Visit (HOSPITAL_COMMUNITY): Payer: Medicare HMO

## 2019-11-12 DIAGNOSIS — L602 Onychogryphosis: Secondary | ICD-10-CM | POA: Diagnosis not present

## 2019-11-12 DIAGNOSIS — M79671 Pain in right foot: Secondary | ICD-10-CM | POA: Diagnosis not present

## 2019-11-12 DIAGNOSIS — M21622 Bunionette of left foot: Secondary | ICD-10-CM | POA: Diagnosis not present

## 2019-11-12 DIAGNOSIS — M21621 Bunionette of right foot: Secondary | ICD-10-CM | POA: Diagnosis not present

## 2019-11-12 DIAGNOSIS — L84 Corns and callosities: Secondary | ICD-10-CM | POA: Diagnosis not present

## 2019-11-12 DIAGNOSIS — M79672 Pain in left foot: Secondary | ICD-10-CM | POA: Diagnosis not present

## 2019-11-12 DIAGNOSIS — I70293 Other atherosclerosis of native arteries of extremities, bilateral legs: Secondary | ICD-10-CM | POA: Diagnosis not present

## 2019-11-13 ENCOUNTER — Other Ambulatory Visit: Payer: Self-pay | Admitting: Hematology and Oncology

## 2019-11-16 ENCOUNTER — Other Ambulatory Visit: Payer: Medicare HMO

## 2019-11-16 ENCOUNTER — Ambulatory Visit: Payer: Medicare HMO

## 2019-11-16 ENCOUNTER — Encounter: Payer: Medicare HMO | Admitting: Nutrition

## 2019-11-16 ENCOUNTER — Ambulatory Visit: Payer: Medicare HMO | Admitting: Hematology and Oncology

## 2019-11-17 ENCOUNTER — Other Ambulatory Visit: Payer: Self-pay

## 2019-11-17 ENCOUNTER — Ambulatory Visit: Payer: Medicare HMO | Admitting: Radiation Oncology

## 2019-11-17 ENCOUNTER — Ambulatory Visit (HOSPITAL_COMMUNITY)
Admission: RE | Admit: 2019-11-17 | Discharge: 2019-11-17 | Disposition: A | Discharge status: Home / Self Care | Payer: Medicare HMO | Source: Ambulatory Visit | Attending: Hematology and Oncology | Admitting: Hematology and Oncology

## 2019-11-17 ENCOUNTER — Ambulatory Visit: Payer: Medicare HMO

## 2019-11-17 DIAGNOSIS — N132 Hydronephrosis with renal and ureteral calculous obstruction: Secondary | ICD-10-CM | POA: Diagnosis not present

## 2019-11-17 DIAGNOSIS — C8331 Diffuse large B-cell lymphoma, lymph nodes of head, face, and neck: Secondary | ICD-10-CM | POA: Insufficient documentation

## 2019-11-17 DIAGNOSIS — C8330 Diffuse large B-cell lymphoma, unspecified site: Secondary | ICD-10-CM | POA: Diagnosis not present

## 2019-11-17 LAB — GLUCOSE, CAPILLARY: Glucose-Capillary: 118 mg/dL — ABNORMAL HIGH (ref 70–99)

## 2019-11-17 MED ORDER — FLUDEOXYGLUCOSE F - 18 (FDG) INJECTION
9.2900 | Freq: Once | INTRAVENOUS | Status: AC | PRN
  Administered 2019-11-17: 9.29 via INTRAVENOUS

## 2019-11-19 ENCOUNTER — Other Ambulatory Visit: Payer: Self-pay

## 2019-11-19 ENCOUNTER — Other Ambulatory Visit (HOSPITAL_COMMUNITY): Payer: Self-pay | Admitting: *Deleted

## 2019-11-19 ENCOUNTER — Ambulatory Visit (HOSPITAL_COMMUNITY)
Admission: RE | Admit: 2019-11-19 | Discharge: 2019-11-19 | Disposition: A | Discharge status: Home / Self Care | Payer: Medicare HMO | Source: Ambulatory Visit | Attending: Nurse Practitioner | Admitting: Nurse Practitioner

## 2019-11-19 ENCOUNTER — Encounter (HOSPITAL_COMMUNITY): Payer: Self-pay | Admitting: Nurse Practitioner

## 2019-11-19 ENCOUNTER — Other Ambulatory Visit: Payer: Self-pay | Admitting: *Deleted

## 2019-11-19 VITALS — BP 118/66 | HR 51 | Ht 70.0 in | Wt 182.8 lb

## 2019-11-19 DIAGNOSIS — D649 Anemia, unspecified: Secondary | ICD-10-CM | POA: Insufficient documentation

## 2019-11-19 DIAGNOSIS — I10 Essential (primary) hypertension: Secondary | ICD-10-CM | POA: Diagnosis not present

## 2019-11-19 DIAGNOSIS — E785 Hyperlipidemia, unspecified: Secondary | ICD-10-CM | POA: Insufficient documentation

## 2019-11-19 DIAGNOSIS — Z881 Allergy status to other antibiotic agents status: Secondary | ICD-10-CM | POA: Diagnosis not present

## 2019-11-19 DIAGNOSIS — C8590 Non-Hodgkin lymphoma, unspecified, unspecified site: Secondary | ICD-10-CM | POA: Diagnosis not present

## 2019-11-19 DIAGNOSIS — Z8279 Family history of other congenital malformations, deformations and chromosomal abnormalities: Secondary | ICD-10-CM | POA: Insufficient documentation

## 2019-11-19 DIAGNOSIS — Z880 Allergy status to penicillin: Secondary | ICD-10-CM | POA: Diagnosis not present

## 2019-11-19 DIAGNOSIS — Z87891 Personal history of nicotine dependence: Secondary | ICD-10-CM | POA: Insufficient documentation

## 2019-11-19 DIAGNOSIS — I4891 Unspecified atrial fibrillation: Secondary | ICD-10-CM | POA: Insufficient documentation

## 2019-11-19 DIAGNOSIS — H548 Legal blindness, as defined in USA: Secondary | ICD-10-CM | POA: Diagnosis not present

## 2019-11-19 DIAGNOSIS — H409 Unspecified glaucoma: Secondary | ICD-10-CM | POA: Insufficient documentation

## 2019-11-19 DIAGNOSIS — H9190 Unspecified hearing loss, unspecified ear: Secondary | ICD-10-CM | POA: Diagnosis not present

## 2019-11-19 DIAGNOSIS — Z882 Allergy status to sulfonamides status: Secondary | ICD-10-CM | POA: Diagnosis not present

## 2019-11-19 DIAGNOSIS — Z79899 Other long term (current) drug therapy: Secondary | ICD-10-CM | POA: Insufficient documentation

## 2019-11-19 DIAGNOSIS — Z9221 Personal history of antineoplastic chemotherapy: Secondary | ICD-10-CM | POA: Insufficient documentation

## 2019-11-19 DIAGNOSIS — D6869 Other thrombophilia: Secondary | ICD-10-CM

## 2019-11-19 DIAGNOSIS — Z888 Allergy status to other drugs, medicaments and biological substances status: Secondary | ICD-10-CM | POA: Diagnosis not present

## 2019-11-19 DIAGNOSIS — C8331 Diffuse large B-cell lymphoma, lymph nodes of head, face, and neck: Secondary | ICD-10-CM

## 2019-11-19 LAB — CBC
HCT: 38.4 % — ABNORMAL LOW (ref 39.0–52.0)
Hemoglobin: 12.7 g/dL — ABNORMAL LOW (ref 13.0–17.0)
MCH: 32.3 pg (ref 26.0–34.0)
MCHC: 33.1 g/dL (ref 30.0–36.0)
MCV: 97.7 fL (ref 80.0–100.0)
Platelets: 266 10*3/uL (ref 150–400)
RBC: 3.93 MIL/uL — ABNORMAL LOW (ref 4.22–5.81)
RDW: 15.1 % (ref 11.5–15.5)
WBC: 10.1 10*3/uL (ref 4.0–10.5)
nRBC: 0 % (ref 0.0–0.2)

## 2019-11-19 MED ORDER — APIXABAN 5 MG PO TABS: 5.0000 mg | ORAL_TABLET | Freq: Two times a day (BID) | ORAL | 3 refills | Status: AC

## 2019-11-19 MED ORDER — APIXABAN 5 MG PO TABS
5.0000 mg | ORAL_TABLET | Freq: Two times a day (BID) | ORAL | 3 refills | Status: DC
Start: 2019-11-19 — End: 2019-11-19

## 2019-11-19 NOTE — Patient Instructions (Signed)
Start Eliquis 5mg twice a day 

## 2019-11-19 NOTE — Progress Notes (Signed)
Primary Care Physician: Eulas Post, MD Referring Physician: Dr. Rayann Heman Cardiologist: Dr. Scarlette Calico is a 79 y.o. male with a h/o afib, lymphoma of neck, legal blindness. Per pt, afib  was found in February of this year, at time of echo. He was unaware and had  slow ventricular response without rate control drugs. He was referred to [facility] by oncologist for further evaluation, who referred to Dr. Rayann Heman. The patient at the time of Dr. Jackalyn Lombard visit did not want to start anticoagulation while undergoing chemo. Dr. Rayann Heman did not think PPM was indicated at the time.   He has now finished chemo x 4 weeks and is willing to follow Dr. Kendell Bane plan now of starting anticoagulation and subsequent DCCV after adequate time on drug. Pt will have to have radiation of the neck  at some point  in the near future .  He had one dip in plts in March to 72, CBC one month ago plts were in normal range. Per oncologist, ok to start anticoagulation if plts are over 50. Will repeat CBC today with start of eliquis.    Today, he denies symptoms of palpitations, chest pain, shortness of breath, orthopnea, PND, lower extremity edema, dizziness, presyncope, syncope, or neurologic sequela. The patient is tolerating medications without difficulties and is otherwise without complaint today.   Past Medical History:  Diagnosis Date  . Abdominal pain, epigastric 07/14/2009  . Abdominal wall hernia   . ALLERGIC RHINITIS 12/12/2006  . ASTHMA 11/28/2007  . 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 Diffuse large B cell lymphoma ([place])   . GLAUCOMA 12/12/2006  . 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 HERNIA, VENTRAL 01/04/2009  . HYPERLIPIDEMIA 11/28/2007  . HYPERTENSION 12/12/2006  . Leg swelling 04-24-12   occ., none at present  . Persistent atrial fibrillation ([place])   . RENAL CALCULUS 03/24/2010  . VENOUS INSUFFICIENCY 01/18/2010  . Visual disturbance 04-24-12   legally blind   Past Surgical History:    Procedure Laterality Date  . CATARACT EXTRACTION  2009 - approximate   bilat & glaucoma surgery (7 ophth surgeries)  . INGUINAL HERNIA REPAIR  05/02/2012   Procedure: HERNIA REPAIR INGUINAL ADULT BILATERAL;  Surgeon: Shann Medal, MD;  Location: [facility];  Service: General;  Laterality: N/A;  . IR IMAGING GUIDED PORT INSERTION  09/07/2019  . VASECTOMY  04-24-12  . VENTRAL HERNIA REPAIR  05/02/2012   Procedure: LAPAROSCOPIC VENTRAL HERNIA;  Surgeon: Shann Medal, MD;  Location: [facility];  Service: General;  Laterality: N/A;    Current Outpatient Medications  Medication Sig Dispense Refill  . amLODipine (NORVASC) 5 MG tablet TAKE 1 TABLET BY MOUTH EVERY DAY 90 tablet 2  . benazepril-hydrochlorthiazide (LOTENSIN HCT) 20-12.5 MG tablet Take 1 tablet by mouth daily. 90 tablet 3  . dorzolamide-timolol (COSOPT) 22.3-6.8 MG/ML ophthalmic solution Place 1 drop into both eyes 2 times daily.    . Multiple Vitamin (MULTIVITAMIN) capsule Take 1 capsule by mouth daily.    . Netarsudil-Latanoprost (ROCKLATAN) 0.02-0.005 % SOLN Place 1 drop into both eyes daily.    . pravastatin (PRAVACHOL) 40 MG tablet Take 1 tablet (40 mg total) by mouth daily. Please schedule an appointment for further refills. [phone] 90 tablet 3   No current facility-administered medications for this encounter.    Allergies  Allergen Reactions  . Dorzolamide Anaphylaxis and Dermatitis    Irritation on upper lids   . Acetazolamide Diarrhea    Lost weight also Lost weight  also   . Bimatoprost Dermatitis and Other (See Comments)    Unknown Unknown   . Brimonidine Other (See Comments)    Unknown Unknown   . Sulfa Antibiotics   . Sulfamethoxazole Hives  . Dorzolamide Hcl-Timolol Mal Rash    itching itching   . Erythromycin Rash  . Penicillins Rash  . Sulfonamide Derivatives Rash    Social History   Socioeconomic History  . Marital status: Married    Spouse name: Not on file  . Number of children: Not on  file  . Years of education: Not on file  . Highest education level: Not on file  Occupational History  . Not on file  Tobacco Use  . Smoking status: Former Smoker    Packs/day: 1.50    Years: 14.00    Pack years: 21.00    Types: Cigarettes    Quit date: 11/06/1969    Years since quitting: 50.0  . Smokeless tobacco: Never Used  Substance and Sexual Activity  . Alcohol use: Yes    Alcohol/week: 14.0 standard drinks    Types: 14 Glasses of wine per week    Comment: wine & beer - 3 glasses daily; Waits 10 days after chemo treatment  . Drug use: No  . Sexual activity: Yes  Other Topics Concern  . Not on file  Social History Narrative   Lives in [place] with wife.      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 Difficulty of Paying Living Expenses:   Food Insecurity:   . Worried About Charity fundraiser in the Last Year:   . Arboriculturist in the Last Year:   Transportation Needs:   . Film/video editor (Medical):   Marland Kitchen Lack of Transportation (Non-Medical):   Physical Activity:   . Days of Exercise per Week:   . Minutes of Exercise per Session:   Stress:   . Feeling of Stress :   Social Connections:   . Frequency of Communication with Friends and Family:   . Frequency of Social Gatherings with Friends and Family:   . Attends Religious Services:   . Active Member of Clubs or Organizations:   . Attends Archivist Meetings:   Marland Kitchen Marital Status:   Intimate Partner Violence:   . Fear of Current or Ex-Partner:   . Emotionally Abused:   Marland Kitchen Physically Abused:   . Sexually Abused:     Family History  Problem Relation Age of Onset  . Cancer Mother   . Pneumonia Father   . Heart disease Brother        congen heart disease  . Birth defects Paternal Aunt        lung  . Colon cancer Neg Hx   . Stomach cancer Neg Hx     ROS- All systems are reviewed and negative except as per the HPI  above  Physical Exam: Vitals:   11/19/19 1001  BP: 118/66  Pulse: (!) 51  Weight: 82.9 kg  Height: 5\' 10"  (1.778 m)   Wt Readings from Last 3 Encounters:  11/19/19 82.9 kg  10/26/19 84.6 kg  10/16/19 83.9 kg    Labs: Lab Results  Component Value Date   NA 142 10/26/2019   K 3.8 10/26/2019   CL 106 10/26/2019   CO2 27 10/26/2019   GLUCOSE 138 (H) 10/26/2019   BUN 13 10/26/2019   CREATININE  0.92 10/26/2019   CALCIUM 8.9 10/26/2019   MG 1.8 10/26/2019   No results found for: INR Lab Results  Component Value Date   CHOL 143 07/14/2018   HDL 63.80 07/14/2018   LDLCALC 64 07/14/2018   TRIG 73.0 07/14/2018     GEN- The patient is well appearing, alert and oriented x 3 today.   Head- normocephalic, atraumatic Eyes-  Sclera clear, conjunctiva pink Ears- hearing intact Oropharynx- clear Neck- supple, no JVP Lymph- no cervical lymphadenopathy Lungs- Clear to ausculation bilaterally, normal work of breathing Heart- irregular rate and rhythm, no murmurs, rubs or gallops, PMI not laterally displaced GI- soft, NT, ND, + BS Extremities- no clubbing, cyanosis, or edema MS- no significant deformity or atrophy Skin- no rash or lesion Psych- euthymic mood, full affect Neuro- strength and sensation are intact  EKG-afib at 51 bpm, qrs int 90 ms, qtc 412 ms   Echo- 08/2019-  1. Pt in atrial fibrillation during the study; normal LV function; mild  LVH; mild biatrial enlargement; trace AI.  2. Left ventricular ejection fraction, by estimation, is 60 to 65%. The  left ventricle has normal function. The left ventricle has no regional  wall motion abnormalities. There is mild left ventricular hypertrophy.  Left ventricular diastolic function  could not be evaluated. Left ventricular diastolic function could not be  evaluated.  3. Right ventricular systolic function is normal. The right ventricular  size is normal. There is normal pulmonary artery systolic pressure.  4. Left  atrial size was mildly dilated.  5. Right atrial size was mildly dilated.  6. The mitral valve is normal in structure and function. Trivial mitral  valve regurgitation. No evidence of mitral stenosis.  7. The aortic valve is tricuspid. Aortic valve regurgitation is trivial.  Mild aortic valve sclerosis is present, with no evidence of aortic valve  stenosis.  8. The inferior vena cava is normal in size with greater than 50%  respiratory variability, suggesting right atrial pressure of 3 mmHg.   FINDINGS  Left Ventricle: Left ventricular ejection fraction, by estimation, is 60  to 65%. The left ventricle has normal function. The left ventricle has no  regional wall motion abnormalities. The left ventricular internal cavity  size was normal in size. There is  mild left ventricular hypertrophy. Left ventricular diastolic function  could not be evaluated due to atrial fibrillation. Left ventricular  diastolic function could not be evaluated.   Right Ventricle: The right ventricular size is normal.Right ventricular  systolic function is normal. There is normal pulmonary artery systolic  pressure. The tricuspid regurgitant velocity is 2.40 m/s, and with an  assumed right atrial pressure of 3 mmHg,  the estimated right ventricular systolic pressure is [id number] mmHg.   Assessment and Plan:  1. New onset asymptomatic  afib with SVR,  Dx 08/2019 Discussed with pt and he would like to proceed with Dr. Jackalyn Lombard plan of start of anticoagulation and subsequent cardioversion after at least 3 weeks of anticoagulation.  2. CHA2DS2VASc score of 3  Will start eliquis 5 mg bid, reminded not to miss any doses with DCCV pending in the near future  30 day free card given Bleeding precautions discussed  CBC today to further assess ptls, mild anemia at baseline    3. Lymphoma  Per oncology   Will see back in 2 weeks to see tolerance of eliquis  and to think about setting up cardioversion after adequate  anticoagulation   Butch Penny C. Namine Beahm, [place]  [facility] [address] [place], [place] [address] [phone]

## 2019-11-20 ENCOUNTER — Ambulatory Visit: Payer: Medicare HMO

## 2019-11-20 NOTE — Progress Notes (Signed)
Head and Neck Cancer Location of Tumor / Histology: LEFT tonsillar B cell lymphoma  Patient presented ~3 months ago with symptoms of:  Soreness on left side of throat and his wife noticed noticed malodorous breath. He was then seen by his PCP who noticed an ulcer on patient's left tonsil, and therefore referred him to ENT  Biopsies of oropharynx and bone marrow  revealed:  08/10/2019 Patient with a left tonsil mass that extends down toward the base of tongue.  Vallecula and epiglottis were uninvolved.  Mass extends up toward the superior pole of the tonsil but does not involve the soft palate. 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  Clinical T2N1  09/04/2019 DIAGNOSIS:  BONE MARROW, ASPIRATE, CLOT, CORE:  - Normocellular marrow with trilineage hematopoiesis  - No lymphoma identified  PERIPHERAL BLOOD:  - Morphologically unremarkable  - See complete blood cell count  MICROSCOPIC DESCRIPTION:  PERIPHERAL BLOOD SMEAR: The peripheral blood is morphologically  unremarkable.  BONE MARROW ASPIRATE: Spicular, cellular and adequate for evaluation  Erythroid precursors: Orderly maturation without overt dysplasia  Granulocytic precursors: Orderly maturation without overt dysplasia  Megakaryocytes: Qualitatively and quantitatively unremarkable  Lymphocytes/plasma cells: No lymphocytosis or plasmacytosis  TOUCH PREPARATIONS: Hypocellular with no additional findings ascompared  to the aspirate smears.  CLOT AND BIOPSY: Examination of the core biopsy and clot section reveals  a normocellular bone marrow (30-40%) with trilineage hematopoiesis.  Myeloid and erythroid elements are present in essentially normal  proportions. Megakaryocytes display a spectrum of maturation without  clustering. There are no granulomas identified. A single well-formed  lymphoid aggregate is noted on the clot section. By  immunohistochemistry, is composed of an admixture of B and T cells  (CD20, CD3) without aberrant B-cell  expression of CD5 or CD10.  IRON STAIN: Iron stains are performed on a bone marrow aspirate or touch  imprint smear and section of clot. The controls stained appropriately.    Storage Iron: Present    Ring Sideroblasts: Not identified  ADDITIONAL DATA/TESTING: Cytogenetics is pending and will be reported  separately. Flow cytometry performed on the bone marrow biopsy did not  identify a monoclonal B-cell or phenotypically aberrant T-cell  population (See [id number]).  CELL COUNT DATA:  Bone Marrow count performed on 500 cells shows:  Blasts:  0%  Myeloid: 57%  Promyelocytes: 0%  Erythroid:   28%  Myelocytes:  10% Lymphocytes:  7%  Metamyelocytes:   2%  Plasma cells: 8%  Bands:  7%  Neutrophils:  33% M:E ratio:   2.03  Eosinophils:  5%  Basophils:   0%  Monocytes:   0%  Lab Data: CBC performed on 09/04/2019 shows:  WBC: 8.6 k/uL Neutrophils:  63%  Hgb: 14.2 g/dL Lymphocytes:  30%  HCT: 41.2 %  Monocytes:   3%  MCV: 94.7 fL  Eosinophils:  4%  RDW: 11.8 %  Basophils:   0%  PLT: 263 k/uL   Nutrition Status Yes No Comments  Weight changes? []  [x]    Swallowing concerns? []  [x]    PEG? []  [x]     Referrals Yes No Comments  Social Work? [x]  []  For social support  Dentistry? []  [x]    Swallowing therapy? []  [x]    Nutrition? [x]  []  For nutrition support during and after treatment  Med/Onc? [x]  []     Safety Issues Yes No Comments  Prior radiation? []  [x]    Pacemaker/ICD? []  [x]    Possible current pregnancy? []  [x]    Is the patient on methotrexate? []  [x]     Tobacco/Marijuana/Snuff/ETOH use:  He stopped smoking over 30 years ago. Occasionally drinks wine and beer.  Past/Anticipated interventions by otolaryngology, if any:  08/10/2019 Dr. Melony Overly Oropharynx biopsy done in office and referral made to Dr. Fredricka Bonine at [facility] for robotic surgery  Past/Anticipated interventions by medical oncology, if any: Under care  of Dr. Narda Rutherford IV -- started R-CEOP chemotherapy on 09/14/2019. This is therapy with curative intent. Etoposide was substituted for anthracycline therapy due to his atrial fibrillation found on TTE.  -- At this time his findings are consistent with a Stage I. Plan is for 3 cycles of R-CEOP with ISRT to site of disease assuming interval PET scan shows no active disease. Patient established with Rad/Onc for consideration of ISRT.  --PET CT scan to be performed in 2-3 weeks. If no signs of residual disease patient will undergo radiation treatment with Dr. Isidore Moos.  --plan to have patient RTC 11/23/2019 after the completion of Cycle 3 chemotherapy to review PET CT and assure stable labs   Current Complaints / other details:   Newly diagnosed atrial fibrilation. Went to A.fib clinic on 11/19/2019: "Discussed with pt and he would like to proceed with Dr. Jackalyn Lombard plan of start of anticoagulation and subsequent cardioversion after at least 3 weeks of anticoagulation." Patient in for consult doing well. Denies any issues or complaints of.

## 2019-11-22 ENCOUNTER — Other Ambulatory Visit: Payer: Self-pay | Admitting: Hematology and Oncology

## 2019-11-22 DIAGNOSIS — C8331 Diffuse large B-cell lymphoma, lymph nodes of head, face, and neck: Secondary | ICD-10-CM

## 2019-11-22 NOTE — Progress Notes (Signed)
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 Lucia Gaskins for tonsillar mass present for a few weeks. 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 FISH studies pending.  2) 08/17/2019: establish care with Dr. Lorenso Courier   3) 08/28/2019: PET CT scan reveals activity in left lingual tonsil, level 2 lymph node, and level 3 lymph node. Consistent with Stage I 4) 09/04/2019: Bone marrow biopsy shows no evidence of lymphoma in the bone marrow.  5) 09/04/2019; TTE showed EF 60-65%, however patient found to have new atrial fibrillation.  6) 09/14/2019: started R-CEOP chemotherapy, Cycle 1 Day 1 7) 10/05/2019: R-CEOP chemotherapy, Cycle 2 Day 1 8) 10/26/2019: R-CEOP chemotherapy Cycle 3 Day 1  HISTORY OF PRESENTING ILLNESS:  Charles Li 79 y.o. male with medical history significant for left tonsillar B cell lymphoma presents for f/u. He was last seen on 10/26/2019 in our clinic. In the interim he has had completed Cycle 3 of R-CEOP therapy and presents for post chemotherapy f/u and review of PET CT scan today.   On exam today Charles Li notes that he has continued to tolerate chemotherapy remarkably well so far.  He notes that he feels fine and that does not have any residual symptoms following his final dose of chemotherapy.  He does have hair loss, but otherwise never experienced any issues with nausea, vomiting, or diarrhea.  He does  endorse having some fatigue, but reports that he is currently at his baseline levels.  He is started on Eliquis therapy for atrial fibrillation and has not had any issues with bleeding, bruising, or dark stools, though should be noted the patient is legally blind and may not be able to visualize these.  He is currently scheduled for CT simulation tomorrow with radiation oncology.  He had no additional comments questions or concerns today.  A full 10 point ROS is listed below.  MEDICAL HISTORY:  Past Medical History:  Diagnosis Date  . Abdominal pain, epigastric 07/14/2009  . Abdominal wall hernia   . ALLERGIC RHINITIS 12/12/2006  . ASTHMA 11/28/2007  . 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 Diffuse large B cell lymphoma ([place])   . GLAUCOMA 12/12/2006  . 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 HERNIA, VENTRAL 01/04/2009  . HYPERLIPIDEMIA 11/28/2007  . HYPERTENSION 12/12/2006  . Leg swelling 04-24-12   occ., none at present  . Persistent atrial fibrillation ([place])   . RENAL CALCULUS 03/24/2010  . VENOUS INSUFFICIENCY 01/18/2010  . Visual disturbance 04-24-12   legally blind    ALLERGIES:  is allergic to dorzolamide; acetazolamide; bimatoprost; brimonidine; sulfa antibiotics; sulfamethoxazole; dorzolamide hcl-timolol mal; erythromycin; penicillins; and sulfonamide derivatives.  MEDICATIONS:  Current Outpatient Medications  Medication Sig Dispense Refill  . amLODipine (NORVASC) 5 MG tablet TAKE 1 TABLET BY MOUTH EVERY DAY 90 tablet 2  . apixaban (ELIQUIS) 5 MG TABS tablet Take 1 tablet (5 mg total) by mouth 2 (two) times Charles. 180 tablet 3  . benazepril-hydrochlorthiazide (LOTENSIN HCT) 20-12.5 MG tablet Take 1 tablet by mouth Charles. 90 tablet 3  .  dorzolamide-timolol (COSOPT) 22.3-6.8 MG/ML ophthalmic solution Place 1 drop into both eyes 2 times Charles.    . Multiple Vitamin (MULTIVITAMIN) capsule Take 1 capsule by mouth Charles.    . Netarsudil-Latanoprost (ROCKLATAN) 0.02-0.005 % SOLN Place 1 drop into both eyes  Charles.    . pravastatin (PRAVACHOL) 40 MG tablet Take 1 tablet (40 mg total) by mouth Charles. Please schedule an appointment for further refills. [phone] 90 tablet 3   No current facility-administered medications for this visit.    REVIEW OF SYSTEMS:   Constitutional: ( - ) fevers, ( - )  chills , ( - ) night sweats (+) hair loss Eyes: ( - ) blurriness of vision, ( - ) double vision, ( - ) watery eyes Ears, nose, mouth, throat, and face: ( - ) mucositis, ( + ) sore throat Respiratory: ( - ) cough, ( - ) dyspnea, ( - ) wheezes Cardiovascular: ( - ) palpitation, ( - ) chest discomfort, ( - ) lower extremity swelling Gastrointestinal:  ( - ) nausea, ( - ) heartburn, ( - ) change in bowel habits Skin: ( - ) abnormal skin rashes Lymphatics: ( - ) new lymphadenopathy, ( - ) easy bruising Neurological: ( - ) numbness, ( - ) tingling, ( - ) new weaknesses Behavioral/Psych: ( - ) mood change, ( - ) new changes  All other systems were reviewed with the patient and are negative.  PHYSICAL EXAMINATION: ECOG PERFORMANCE STATUS: 1 - Symptomatic but completely ambulatory  Vitals:   11/23/19 1016  BP: 123/79  Pulse: (!) 50  Resp: 18  Temp: 98.2 F (36.8 C)  SpO2: 99%   Filed Weights   11/23/19 1016  Weight: 184 lb 14.4 oz (83.9 kg)    GENERAL: well appearing elderly Caucasian male in NAD, balding with considerably thinner hair on his head  SKIN: skin color, texture, turgor are normal, no rashes or significant lesions EYES: conjunctiva are pink and non-injected, sclera clear LUNGS: clear to auscultation and percussion with normal breathing effort HEART: regular rate & rhythm and no murmurs and no lower extremity edema Musculoskeletal: no cyanosis of digits and no clubbing  PSYCH: alert & oriented x 3, fluent speech NEURO: no focal motor/sensory deficits  LABORATORY DATA:  I have reviewed the data as listed CBC Latest Ref Rng & Units 11/23/2019 11/19/2019 10/26/2019  WBC 4.0 - 10.5  K/uL 9.3 10.1 15.1(H)  Hemoglobin 13.0 - 17.0 g/dL 12.3(L) 12.7(L) 12.5(L)  Hematocrit 39.0 - 52.0 % 36.1(L) 38.4(L) 36.4(L)  Platelets 150 - 400 K/uL 245 266 262    CMP Latest Ref Rng & Units 11/23/2019 10/26/2019 10/05/2019  Glucose 70 - 99 mg/dL 133(H) 138(H) 154(H)  BUN 8 - 23 mg/dL 16 13 17   Creatinine 0.61 - 1.24 mg/dL 0.86 0.92 0.88  Sodium 135 - 145 mmol/L 142 142 141  Potassium 3.5 - 5.1 mmol/L 3.6 3.8 3.7  Chloride 98 - 111 mmol/L 104 106 103  CO2 22 - 32 mmol/L 32 27 28  Calcium 8.9 - 10.3 mg/dL 9.3 8.9 9.2  Total Protein 6.5 - 8.1 g/dL 5.9(L) 6.0(L) 6.1(L)  Total Bilirubin 0.3 - 1.2 mg/dL 0.7 0.5 0.5  Alkaline Phos 38 - 126 U/L 62 66 62  AST 15 - 41 U/L 14(L) 11(L) 14(L)  ALT 0 - 44 U/L 13 12 15     PATHOLOGY:  SURGICAL PATHOLOGY  CASE: [id number]  PATIENT: Charles Li  Surgical Pathology Report   Clinical History: tonsil cancer (cm)   FINAL MICROSCOPIC  DIAGNOSIS:   A. TONSIL, LEFT, BIOPSY:  - Large B-cell lymphoma  - See comment   COMMENT:  The tonsillar tissue has an ulcerated surface with underlying atypical  cellular proliferation. By immunohistochemistry, the neoplastic cells  are positive for CD20, CD10, BCL6 and BCL2 (weak) but negative for CD3,  CD56, CD5, Mum-1, cytokeratin AE1/3 and EBV by in situ hybridization.  The proliferative rate by Ki-67 is 70 to 80%. Overall, the features are  consistent with a large B-cell lymphoma. The differential diagnosis  includes diffuse large B-cell lymphoma and high-grade B-cell lymphoma  (double hit). FISH is pending and will be reported in an addendum.   Preliminary results of this case were given to Dr. Lucia Gaskins on August 14, 2019.   Dr. Tresa Moore reviewed the case and agrees with the above diagnosis.   GROSS DESCRIPTION:   The specimen is received in formalin and consists of a 0.9 x 0.4 x 0.4  cm piece of tan soft tissue. The specimen is entirely submitted in 1  cassette. Craig Staggers 08/14/2019)   Final  Diagnosis performed by Thressa Sheller, MD.  Electronically signed  08/14/2019  Technical and / or Professional components performed at [facility]. [facility], [place] [address], [place], [place] [address].  Immunohistochemistry Technical component (if applicable) was performed  at [facility]. [address] [place],  [place], [place] [address].  IMMUNOHISTOCHEMISTRY DISCLAIMER (if applicable):  Some of these immunohistochemical stains may have been developed and the  performance characteristics determine by [facility]. Some  may not have been cleared or approved by the U.S. Food and Drug  Administration. The FDA has determined that such clearance or approval  is not necessary. This test is used for clinical purposes. It should not  be regarded as investigational or for research. This laboratory is  certified under the [place]  (CLIA-88) as qualified to perform high complexity clinical laboratory  testing. The controls stained appropriately.  SURGICAL PATHOLOGY  ** THIS IS AN ADDENDUM REPORT **  CASE: [id number]  PATIENT: Charles Li  Bone Marrow Report  **Addendum **   Reason for Addendum #1: Cytogenetics results   Clinical History: lymphoma, DLBCL, left posterior iliac, (ADC)    DIAGNOSIS:   BONE MARROW, ASPIRATE, CLOT, CORE:  - Normocellular marrow with trilineage hematopoiesis  - No lymphoma identified   PERIPHERAL BLOOD:  - Morphologically unremarkable  - See complete blood cell count   MICROSCOPIC DESCRIPTION:   PERIPHERAL BLOOD SMEAR: The peripheral blood is morphologically  unremarkable.   BONE MARROW ASPIRATE: Spicular, cellular and adequate for evaluation  Erythroid precursors: Orderly maturation without overt dysplasia  Granulocytic precursors: Orderly maturation without overt dysplasia  Megakaryocytes: Qualitatively and quantitatively unremarkable  Lymphocytes/plasma  cells: No lymphocytosis or plasmacytosis   TOUCH PREPARATIONS: Hypocellular with no additional findings ascompared  to the aspirate smears.   CLOT AND BIOPSY: Examination of the core biopsy and clot section reveals  a normocellular bone marrow (30-40%) with trilineage hematopoiesis.  Myeloid and erythroid elements are present in essentially normal  proportions. Megakaryocytes display a spectrum of maturation without  clustering. There are no granulomas identified. A single well-formed  lymphoid aggregate is noted on the clot section. By  immunohistochemistry, is composed of an admixture of B and T cells  (CD20, CD3) without aberrant B-cell expression of CD5 or CD10.   IRON STAIN: Iron stains are performed on a bone marrow aspirate or touch  imprint smear and section  of clot. The controls stained appropriately.     Storage Iron: Present    Ring Sideroblasts: Not identified   ADDITIONAL DATA/TESTING: Cytogenetics is pending and will be reported  separately. Flow cytometry performed on the bone marrow biopsy did not  identify a monoclonal B-cell or phenotypically aberrant T-cell  population (See [id number]).   CELL COUNT DATA:   Bone Marrow count performed on 500 cells shows:  Blasts:  0%  Myeloid: 57%  Promyelocytes: 0%  Erythroid:   28%  Myelocytes:  10% Lymphocytes:  7%  Metamyelocytes:   2%  Plasma cells: 8%  Bands:  7%  Neutrophils:  33% M:E ratio:   2.03  Eosinophils:  5%  Basophils:   0%  Monocytes:   0%   Lab Data: CBC performed on 09/04/2019 shows:  WBC: 8.6 k/uL Neutrophils:  63%  Hgb: 14.2 g/dL Lymphocytes:  30%  HCT: 41.2 %  Monocytes:   3%  MCV: 94.7 fL  Eosinophils:  4%  RDW: 11.8 %  Basophils:   0%  PLT: 263 k/uL   GROSS DESCRIPTION:   A: Aspirate smear   B: Received in B-plus fixative are tissue fragments measuring 2.1 x 1.0  x 0.3 cm in aggregate. The specimen is submitted in total.   C: Received in  B-plus fixative is a 2.0 x 0.2 cm core of bone which is  submitted in toto fine decalcification. [facility] 09/04/2019)    Final Diagnosis performed by Thressa Sheller, MD.  Electronically signed  09/08/2019  Technical component performed at [facility]  [address] [place], [place] [address].  Professional component performed at [employer]. [facility],  [place] [address] [place], [place] [address].  Immunohistochemistry Technical component (if applicable) was performed  at [facility]. [address], [place],  [place], [place] [address].  IMMUNOHISTOCHEMISTRY DISCLAIMER (if applicable):  Some of these immunohistochemical stains may have been developed and the  performance characteristics determine by [facility]. Some  may not have been cleared or approved by the U.S. Food and Drug  Administration. The FDA has determined that such clearance or approval  is not necessary. This test is used for clinical purposes. It should not  be regarded as investigational or for research. This laboratory is  certified under the [place]  (CLIA-88) as qualified to perform high complexity clinical laboratory  testing. The controls stained appropriately.   ADDENDUM:   ** Please note this testing was performed and interpreted by an outside  facility. This addendum is only being added to provide a summary of the  results for report completeness. Please see electronic medical record  for a copy of the full report. **   Karyotype: 47,?XY,?del(?9)?(?q13q22)?,?+?mar[?3]?/?46,?XY[?20]?  Interpretation: ABNORMAL MALE KARYOTYPE   Cytogenetic analysis shows an abnormal male karyotype. 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 The remaining twenty cells show a normal  karyotype.   Deletion of chromosome 9q occurs in both myeloid (more  common) and  lymphoid disorders, including acute myeloid leukemia (AML) (more  commonly) and myelodysplastic syndrome (MDS) (only rarely). In MDS the  prognostic significance would be expected to be intermediate.  In AML  the prognosis is variable. Marker chromosomes and structural  chromosomal aberrations in the absence of autosomal monosomies have  minimal prognostic impact. Correlation with other clinical and  laboratory findings is indicated.  RADIOGRAPHIC STUDIES:  NM PET Image Restag (PS) Skull Base To Thigh  Result Date: 11/17/2019 CLINICAL DATA:  Subsequent treatment strategy for large B-cell lymphoma. EXAM: NUCLEAR MEDICINE PET SKULL BASE TO THIGH TECHNIQUE: 9.28 mCi F-18 FDG was injected intravenously. Full-ring PET imaging was performed from the skull base to thigh after the radiotracer. CT data was obtained and used for attenuation correction and anatomic localization. Fasting blood glucose: 118 mg/dl COMPARISON:  PET-CT 08/28/2019 FINDINGS: Mediastinal blood pool activity: SUV max 2.7 Liver activity: SUV max 3.8 NECK: Resolution of the hypermetabolic LEFT tonsillar tissue as well as hypermetabolic LEFT level II lymph node. Previously the LEFT level II lymph node just anterior to the sternocleidomastoid muscle measured 14 mm with SUV max equal 44.6. This lymph node is not readily measurable and there is no measurable metabolic activity ( [place]auville 1 Likewise small LEFT level III lymph node described on comparison exam currently measures 3 mm (image 39/4) compared to 5 mm without radiotracer activity. No asymmetric or increased metabolic activity in the LEFT oropharynx No new lymph nodes in the neck Incidental CT findings: none CHEST: No hypermetabolic mediastinal nodes. No hypermetabolic axillary nodes Incidental CT findings: Port in the anterior chest wall with tip in distal SVC. A branching nodular lesion in the superior segment of the LEFT lower lobe is near completely resolved  (image 81/4) and does not have residual metabolic ABDOMEN/PELVIS: No abnormal hypermetabolic activity within the liver, pancreas, adrenal glands, or spleen. No hypermetabolic lymph nodes in the abdomen or pelvis. Incidental CT findings: Normal volume spleen. Severe RIGHT hydronephrosis secondary to obstructing calculus in the mid ureter is unchanged. SKELETON: No focal hypermetabolic activity to suggest skeletal metastasis. Incidental CT findings: none IMPRESSION: 1. Complete resolution of hypermetabolic adenopathy in the LEFT neck. Deauville 1 2. A complete resolution of the asymmetric hypermetabolic activity in the LEFT tonsil region ( Deauville 1) 3. Resolution of branching nodular densities. Segment of the LEFT lower lobe most consistent with resolution with benign infectious or inflammatory process. 4. Chronic severe RIGHT hydronephrosis related to obstructing RIGHT ureteral calculus. Electronically Signed   By: Suzy Bouchard M.D.   On: 11/17/2019 14:35    ASSESSMENT & PLAN Charles Li 79 y.o. 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   In the interim since his last visit the patient has completed all 3 planned cycles of R-CEOP.  The patient is tolerated the treatment well with no adverse side effects other than alopecia,some mild fatigue and constipation.  He denies having any nausea, vomiting, or diarrhea.  He does not currently have any focal symptoms.  Review of Charles Li's PET CT scan is reassuring with complete resolution of FDG avidity in the involved lymph nodes.  The patient is currently scheduled for simulation with radiation oncology tomorrow in order to plan his radiation treatments.  We will continue to be involved for symptom management while he is receiving radiation therapy.  We will schedule follow-up visit toward the end of radiation to assure that the patient is still doing well.  The treatment plan was for Rituximab  375 mg/m2, Cyclophosphamide 750m/m2, etoposide IV 572mm2 (followed by 10076m2 PO on Day 2 and 3), and Vincristine 1.4 mg/m2 all on Day 1. The patient will also received PO prednisone 100m23my 1-5 of each 3 week cycle. Patient now lined up for ISRT with Dr. 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 Stage I  --initial PET scan  findings consistent with a Stage I diffuse large B cell lymphoma of the head/neck. Bone marrow biopsy confirmed no alternative sites of lymphoma.  -- started R-CEOP chemotherapy on 09/14/2019. This is therapy with curative intent. Etoposide was substituted for anthracycline therapy due to his atrial fibrillation found on TTE.  -- At this time his findings are consistent with a Stage I. Plan is for 3 cycles of R-CEOP with ISRT to site of disease. Interval PET scan showed no active disease. Patient established with Rad/Onc and simulation scheduled for tomorrow.  --plan to have patient RTC in 4 weeks to reassess how he is tolerating radiation therapy.   #Oropharynx Pain, resolved --continue taking ibuprofen PRN --patient has oxycodone 5-6m q6H PRN available for throat pain, though he has not needed this --no improvement viscous lidocaine, patient noted that regular mouthwash provided more pain relief.  --provided patient with senna-docusate to assure these medications do not cause him constipation. --continue to monitor  #Symptom Management --will assure patient has zofran 839mPO q8H PRN and compazine 2539m6H PRN. 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   #Atrial Fibrillation, new --currently working with cardiology for recommendations regarding anticoagulation. He has been prescribed eliquis therapy --anticoagulation therapy OK as long as patient Plts >50k   No orders of the defined types were placed in this encounter.   All questions were answered. The patient knows to call the clinic with any problems, questions or concerns.  A total of more than 30 minutes were spent on this encounter and over half of that time was spent on counseling and coordination of care as outlined above.   JohLedell PeoplesD Department of Hematology/Oncology [facility]: [phone]: [phone]: [email or web address]  11/24/2019 8:34 PM

## 2019-11-23 ENCOUNTER — Inpatient Hospital Stay: Payer: Medicare HMO | Attending: Hematology and Oncology | Admitting: Hematology and Oncology

## 2019-11-23 ENCOUNTER — Other Ambulatory Visit: Payer: Self-pay

## 2019-11-23 ENCOUNTER — Inpatient Hospital Stay: Payer: Medicare HMO

## 2019-11-23 VITALS — BP 123/79 | HR 50 | Temp 98.2°F | Resp 18 | Ht 70.0 in | Wt 184.9 lb

## 2019-11-23 DIAGNOSIS — R5383 Other fatigue: Secondary | ICD-10-CM | POA: Diagnosis not present

## 2019-11-23 DIAGNOSIS — R001 Bradycardia, unspecified: Secondary | ICD-10-CM | POA: Insufficient documentation

## 2019-11-23 DIAGNOSIS — Z87442 Personal history of urinary calculi: Secondary | ICD-10-CM | POA: Diagnosis not present

## 2019-11-23 DIAGNOSIS — I482 Chronic atrial fibrillation, unspecified: Secondary | ICD-10-CM | POA: Diagnosis not present

## 2019-11-23 DIAGNOSIS — Z7901 Long term (current) use of anticoagulants: Secondary | ICD-10-CM | POA: Insufficient documentation

## 2019-11-23 DIAGNOSIS — K5900 Constipation, unspecified: Secondary | ICD-10-CM | POA: Diagnosis not present

## 2019-11-23 DIAGNOSIS — N132 Hydronephrosis with renal and ureteral calculous obstruction: Secondary | ICD-10-CM | POA: Insufficient documentation

## 2019-11-23 DIAGNOSIS — Z9221 Personal history of antineoplastic chemotherapy: Secondary | ICD-10-CM | POA: Diagnosis not present

## 2019-11-23 DIAGNOSIS — E785 Hyperlipidemia, unspecified: Secondary | ICD-10-CM | POA: Insufficient documentation

## 2019-11-23 DIAGNOSIS — C8331 Diffuse large B-cell lymphoma, lymph nodes of head, face, and neck: Secondary | ICD-10-CM | POA: Insufficient documentation

## 2019-11-23 DIAGNOSIS — I4819 Other persistent atrial fibrillation: Secondary | ICD-10-CM | POA: Diagnosis not present

## 2019-11-23 DIAGNOSIS — I10 Essential (primary) hypertension: Secondary | ICD-10-CM | POA: Diagnosis not present

## 2019-11-23 DIAGNOSIS — Z79899 Other long term (current) drug therapy: Secondary | ICD-10-CM | POA: Insufficient documentation

## 2019-11-23 DIAGNOSIS — K439 Ventral hernia without obstruction or gangrene: Secondary | ICD-10-CM | POA: Insufficient documentation

## 2019-11-23 LAB — CBC WITH DIFFERENTIAL (CANCER CENTER ONLY)
Abs Immature Granulocytes: 0.06 10*3/uL (ref 0.00–0.07)
Basophils Absolute: 0.1 10*3/uL (ref 0.0–0.1)
Basophils Relative: 1 %
Eosinophils Absolute: 0.5 10*3/uL (ref 0.0–0.5)
Eosinophils Relative: 5 %
HCT: 36.1 % — ABNORMAL LOW (ref 39.0–52.0)
Hemoglobin: 12.3 g/dL — ABNORMAL LOW (ref 13.0–17.0)
Immature Granulocytes: 1 %
Lymphocytes Relative: 8 %
Lymphs Abs: 0.7 10*3/uL (ref 0.7–4.0)
MCH: 33.1 pg (ref 26.0–34.0)
MCHC: 34.1 g/dL (ref 30.0–36.0)
MCV: 97 fL (ref 80.0–100.0)
Monocytes Absolute: 0.9 10*3/uL (ref 0.1–1.0)
Monocytes Relative: 10 %
Neutro Abs: 7 10*3/uL (ref 1.7–7.7)
Neutrophils Relative %: 75 %
Platelet Count: 245 10*3/uL (ref 150–400)
RBC: 3.72 MIL/uL — ABNORMAL LOW (ref 4.22–5.81)
RDW: 15 % (ref 11.5–15.5)
WBC Count: 9.3 10*3/uL (ref 4.0–10.5)
nRBC: 0 % (ref 0.0–0.2)

## 2019-11-23 LAB — LACTATE DEHYDROGENASE: LDH: 138 U/L (ref 98–192)

## 2019-11-23 LAB — CMP (CANCER CENTER ONLY)
ALT: 13 U/L (ref 0–44)
AST: 14 U/L — ABNORMAL LOW (ref 15–41)
Albumin: 3.3 g/dL — ABNORMAL LOW (ref 3.5–5.0)
Alkaline Phosphatase: 62 U/L (ref 38–126)
Anion gap: 6 (ref 5–15)
BUN: 16 mg/dL (ref 8–23)
CO2: 32 mmol/L (ref 22–32)
Calcium: 9.3 mg/dL (ref 8.9–10.3)
Chloride: 104 mmol/L (ref 98–111)
Creatinine: 0.86 mg/dL (ref 0.61–1.24)
GFR, Est AFR Am: >60 mL/min (ref >60)
GFR, Estimated: >60 mL/min (ref >60)
Glucose, Bld: 133 mg/dL — ABNORMAL HIGH (ref 70–99)
Potassium: 3.6 mmol/L (ref 3.5–5.1)
Sodium: 142 mmol/L (ref 135–145)
Total Bilirubin: 0.7 mg/dL (ref 0.3–1.2)
Total Protein: 5.9 g/dL — ABNORMAL LOW (ref 6.5–8.1)

## 2019-11-23 LAB — MAGNESIUM: Magnesium: 1.8 mg/dL (ref 1.7–2.4)

## 2019-11-24 ENCOUNTER — Ambulatory Visit
Admission: RE | Admit: 2019-11-24 | Discharge: 2019-11-24 | Disposition: A | Discharge status: Home / Self Care | Payer: Medicare HMO | Source: Ambulatory Visit | Attending: Radiation Oncology | Admitting: Radiation Oncology

## 2019-11-24 ENCOUNTER — Other Ambulatory Visit: Payer: Self-pay

## 2019-11-24 ENCOUNTER — Encounter: Payer: Self-pay | Admitting: Radiation Oncology

## 2019-11-24 ENCOUNTER — Encounter: Payer: Self-pay | Admitting: Hematology and Oncology

## 2019-11-24 VITALS — BP 139/75 | HR 62 | Temp 97.9°F | Resp 20 | Ht 70.0 in | Wt 184.8 lb

## 2019-11-24 DIAGNOSIS — Z7901 Long term (current) use of anticoagulants: Secondary | ICD-10-CM | POA: Diagnosis not present

## 2019-11-24 DIAGNOSIS — C8331 Diffuse large B-cell lymphoma, lymph nodes of head, face, and neck: Secondary | ICD-10-CM

## 2019-11-24 DIAGNOSIS — Z510 Encounter for antineoplastic radiation therapy: Secondary | ICD-10-CM | POA: Diagnosis not present

## 2019-11-24 DIAGNOSIS — Z79899 Other long term (current) drug therapy: Secondary | ICD-10-CM | POA: Diagnosis not present

## 2019-11-24 DIAGNOSIS — K439 Ventral hernia without obstruction or gangrene: Secondary | ICD-10-CM | POA: Diagnosis not present

## 2019-11-24 DIAGNOSIS — Z87891 Personal history of nicotine dependence: Secondary | ICD-10-CM | POA: Insufficient documentation

## 2019-11-24 DIAGNOSIS — R001 Bradycardia, unspecified: Secondary | ICD-10-CM | POA: Diagnosis not present

## 2019-11-24 DIAGNOSIS — Z9221 Personal history of antineoplastic chemotherapy: Secondary | ICD-10-CM | POA: Diagnosis not present

## 2019-11-24 DIAGNOSIS — I10 Essential (primary) hypertension: Secondary | ICD-10-CM | POA: Diagnosis not present

## 2019-11-24 DIAGNOSIS — Z87442 Personal history of urinary calculi: Secondary | ICD-10-CM | POA: Diagnosis not present

## 2019-11-24 DIAGNOSIS — I4891 Unspecified atrial fibrillation: Secondary | ICD-10-CM | POA: Insufficient documentation

## 2019-11-24 DIAGNOSIS — E785 Hyperlipidemia, unspecified: Secondary | ICD-10-CM | POA: Insufficient documentation

## 2019-11-24 NOTE — Patient Instructions (Signed)
Coronavirus (COVID-19) Are you at risk?  Are you at risk for the Coronavirus (COVID-19)?  To be considered HIGH RISK for Coronavirus (COVID-19), you have to meet the following criteria:  . Traveled to [place], [place], [place], [place] or [place]; or in the [place] to [place], [place], [place], or [place]; and have fever, cough, and shortness of breath within the last 2 weeks of travel OR . Been in close contact with a person diagnosed with COVID-19 within the last 2 weeks and have fever, cough, and shortness of breath . IF YOU DO NOT MEET THESE CRITERIA, YOU ARE CONSIDERED LOW RISK FOR COVID-19.  What to do if you are HIGH RISK for COVID-19?  . If you are having a medical emergency, call 911. . Seek medical care right away. Before you go to a doctor's office, urgent care or emergency department, call ahead and tell them about your recent travel, contact with someone diagnosed with COVID-19, and your symptoms. You should receive instructions from your physician's office regarding next steps of care.  . When you arrive at healthcare provider, tell the healthcare staff immediately you have returned from visiting [place], [place], [place], [place] or [place]; or traveled in the [place] to [place], [place], [place], or [place]; in the last two weeks or you have been in close contact with a person diagnosed with COVID-19 in the last 2 weeks.   . Tell the health care staff about your symptoms: fever, cough and shortness of breath. . After you have been seen by a medical provider, you will be either: o Tested for (COVID-19) and discharged home on quarantine except to seek medical care if symptoms worsen, and asked to  - Stay home and avoid contact with others until you get your results (4-5 days)  - Avoid travel on public transportation if possible (such as bus, train, or airplane) or o Sent to the Emergency Department by EMS for evaluation, COVID-19 testing, and possible  admission depending on your condition and test results.  What to do if you are LOW RISK for COVID-19?  Reduce your risk of any infection by using the same precautions used for avoiding the common cold or flu:  . Wash your hands often with soap and warm water for at least 20 seconds.  If soap and water are not readily available, use an alcohol-based hand sanitizer with at least 60% alcohol.  . If coughing or sneezing, cover your mouth and nose by coughing or sneezing into the elbow areas of your shirt or coat, into a tissue or into your sleeve (not your hands). . Avoid shaking hands with others and consider head nods or verbal greetings only. . Avoid touching your eyes, nose, or mouth with unwashed hands.  . Avoid close contact with people who are sick. . Avoid places or events with large numbers of people in one location, like concerts or sporting events. . Carefully consider travel plans you have or are making. . If you are planning any travel outside or inside the US, visit the CDC's Travelers' Health webpage for the latest health notices. . If you have some symptoms but not all symptoms, continue to monitor at home and seek medical attention if your symptoms worsen. . If you are having a medical emergency, call 911.   ADDITIONAL HEALTHCARE OPTIONS FOR PATIENTS  [facility] / e-Visit: [email or web address]         [facility]: [phone]  [facility]: [phone]                   [facility]: [phone]   

## 2019-11-24 NOTE — Progress Notes (Signed)
Oncology Nurse Navigator Documentation  To provide support, encouragement and care continuity, met with Mr. Charles Li during his follow up appointment with Dr. Isidore Moos and  CT SIM. He was unaccompanied d/t [facility] COVID safety practices.   He tolerated procedure without difficulty, denied questions/concerns.   I toured him to treatment area, explained procedures for lobby registration, arrival to Radiation Waiting, arrival to tmt area and preparation for tmt.  He voiced understanding.   I encouraged him to call me prior to start of radiation if needed.  Harlow Asa RN, BSN, OCN Head & Neck Oncology Nurse [place] at [facility] Phone # [phone]  Fax # [phone]

## 2019-11-24 NOTE — Progress Notes (Signed)
Radiation Oncology         [phone] ________________________________  Follow Up New visit in person Outpatient   Name: Charles Li MRN: [id number]  Date: 11/24/2019  DOB: 18-May-1941  UK:[id number], Alinda Sierras, MD  Orson Slick, MD   REFERRING PHYSICIAN: Orson Slick, MD  DIAGNOSIS:    ICD-10-CM   1. Diffuse large B-cell lymphoma of lymph nodes of neck (HCC)  C83.31     CHIEF COMPLAINT: Here to discuss management of large B-cell lymphoma  Stage: IIA  HISTORY OF PRESENT ILLNESS::Charles Li is a 79 y.o. male who presented with foul-smelling breath, as well as a left tonsillar lesion.  Subsequently, the patient saw Dr. Lucia Gaskins who performed laryngoscopy and biopsy of the lesion. Laryngoscopy showed a left tonsil mass that extends down towards base of tongue and up towards superior pole of tonsil. Vallecula, epiglottis, and soft palate not involved.  Biopsy of left tonsil mass on 08/10/2019 revealed: large B-cell lymphoma with MYC rearrangement.  No double hit. he was referred to Dr. Lorenso Courier on 09/02/2019 and proceeded to bone marrow biopsy on 09/04/2019, which was negative for lymphoma.  I met with the patient on 10/21/2019. At that time, I recommended he complete his chemotherapy and undergo repeat PET scan, then follow up with me after.  He has completed 3 cycles of R-CEOP chemotherapy under Dr. Lorenso Courier.  R-CHOP was not given due to atrial fibrillation.  He underwent PET scan on 11/17/2019 showing: complete resolution of hypermetabolic left neck adenopathy and left tonsil activity. I have personally reviewed his imaging  He has met with nutritionist  He denies any night sweats or fevers prior to diagnosis.  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    PAST SURGICAL HISTORY: Past Surgical History:  Procedure Laterality Date  . CATARACT EXTRACTION  2009 - approximate   bilat & glaucoma surgery (7 ophth surgeries)  . INGUINAL HERNIA REPAIR  05/02/2012   Procedure: HERNIA REPAIR INGUINAL ADULT BILATERAL;  Surgeon: Shann Medal, MD;  Location: [facility];  Service: General;  Laterality: N/A;  . IR IMAGING GUIDED PORT INSERTION  09/07/2019  . VASECTOMY  04-24-12  . VENTRAL HERNIA REPAIR  05/02/2012   Procedure: LAPAROSCOPIC VENTRAL HERNIA;  Surgeon: Shann Medal, MD;  Location: [facility];  Service: General;  Laterality: N/A;    FAMILY HISTORY: family history includes Birth defects in his paternal aunt; Cancer in his mother; Heart disease in his brother; Pneumonia in his father.  SOCIAL HISTORY:  reports that he quit smoking about 50 years ago. His smoking use included cigarettes. He has a 21.00 pack-year smoking history. He has never used smokeless tobacco. He reports current alcohol use of about 14.0 standard drinks of alcohol per week. He reports that he does not use drugs.  ALLERGIES: Dorzolamide, Acetazolamide, Bimatoprost, Brimonidine, Sulfa antibiotics, Sulfamethoxazole, Dorzolamide hcl-timolol mal, Erythromycin, Penicillins, and Sulfonamide derivatives  MEDICATIONS:  Current Outpatient Medications  Medication Sig Dispense Refill  . amLODipine (NORVASC) 5 MG tablet TAKE 1 TABLET BY MOUTH EVERY DAY 90 tablet 2  . apixaban (ELIQUIS) 5 MG TABS tablet Take 1 tablet (5 mg  total) by mouth 2 (two) times daily. 180 tablet 3  . benazepril-hydrochlorthiazide (LOTENSIN HCT) 20-12.5 MG tablet Take 1 tablet by mouth daily. 90 tablet 3  .  dorzolamide-timolol (COSOPT) 22.3-6.8 MG/ML ophthalmic solution Place 1 drop into both eyes 2 times daily.    . Multiple Vitamin (MULTIVITAMIN) capsule Take 1 capsule by mouth daily.    . Netarsudil-Latanoprost (ROCKLATAN) 0.02-0.005 % SOLN Place 1 drop into both eyes daily.    . pravastatin (PRAVACHOL) 40 MG tablet Take 1 tablet (40 mg total) by mouth daily. Please schedule an appointment for further refills. [phone] 90 tablet 3   No current facility-administered medications for this encounter.    REVIEW OF SYSTEMS:  Notable for that above.   PHYSICAL EXAM:  height is _0  (1.778 m) and weight is 184 lb 12.8 oz (83.8 kg). His temperature is 97.9 F (36.6 C). His blood pressure is 139/75 and his pulse is 62. His respiration is 20 and oxygen saturation is 100%.   General: Alert and oriented, in no acute distress No oropharyngeal lesions No neck adenopathy      LABORATORY DATA:  Lab Results  Component Value Date   WBC 9.3 11/23/2019   HGB 12.3 (L) 11/23/2019   HCT 36.1 (L) 11/23/2019   MCV 97.0 11/23/2019   PLT 245 11/23/2019   CMP     Component Value Date/Time   NA 142 11/23/2019 0939   K 3.6 11/23/2019 0939   CL 104 11/23/2019 0939   CO2 32 11/23/2019 0939   GLUCOSE 133 (H) 11/23/2019 0939   BUN 16 11/23/2019 0939   CREATININE 0.86 11/23/2019 0939   CREATININE 0.86 01/11/2012 1715   CALCIUM 9.3 11/23/2019 0939   PROT 5.9 (L) 11/23/2019 0939   ALBUMIN 3.3 (L) 11/23/2019 0939   AST 14 (L) 11/23/2019 0939   ALT 13 11/23/2019 0939   ALKPHOS 62 11/23/2019 0939   BILITOT 0.7 11/23/2019 0939   GFRNONAA >60 11/23/2019 0939   GFRAA >60 11/23/2019 0939      Lab Results  Component Value Date   TSH 0.59 02/23/2015     RADIOGRAPHY: As above, I personally reviewed his images  IMPRESSION/PLAN: This is a delightful 79 year old gentleman with a history of non-Hodgkin's lymphoma  I personally reviewed his PET restaging scan.  He had a complete response.  I recommend  that he undergo 17 fractions of involved site radiation therapy for best chance of local control.  We again reviewed the risks benefits and side effects of radiotherapy.  A consent form was signed today.  He is enthusiastic to proceed with treatment.  Anticipate starting treatment next week.  Simulation will occur today.  On date of service, in total, I spent 30 minutes on this encounter.  Patient was seen in person.  __________________________________________   Eppie Gibson, MD   This document serves as a record of services personally performed by Eppie Gibson, MD. It was created on her behalf by Wilburn Mylar, a trained medical scribe. The creation of this record is based on the scribe's personal observations and the provider's statements to them. This document has been checked and approved by the attending provider.

## 2019-11-27 DIAGNOSIS — C8331 Diffuse large B-cell lymphoma, lymph nodes of head, face, and neck: Secondary | ICD-10-CM | POA: Diagnosis not present

## 2019-11-27 DIAGNOSIS — Z510 Encounter for antineoplastic radiation therapy: Secondary | ICD-10-CM | POA: Diagnosis not present

## 2019-11-30 ENCOUNTER — Other Ambulatory Visit: Payer: Self-pay

## 2019-11-30 ENCOUNTER — Ambulatory Visit
Admission: RE | Admit: 2019-11-30 | Discharge: 2019-11-30 | Disposition: A | Discharge status: Home / Self Care | Payer: Medicare HMO | Source: Ambulatory Visit | Attending: Radiation Oncology | Admitting: Radiation Oncology

## 2019-11-30 DIAGNOSIS — C8331 Diffuse large B-cell lymphoma, lymph nodes of head, face, and neck: Secondary | ICD-10-CM | POA: Diagnosis not present

## 2019-11-30 DIAGNOSIS — Z510 Encounter for antineoplastic radiation therapy: Secondary | ICD-10-CM | POA: Diagnosis not present

## 2019-11-30 MED ORDER — SONAFINE EX EMUL
1.0000 "application " | Freq: Two times a day (BID) | CUTANEOUS | Status: DC
  Administered 2019-11-30: 1 via TOPICAL

## 2019-11-30 NOTE — Progress Notes (Signed)

## 2019-11-30 NOTE — Progress Notes (Signed)
Oncology Nurse Navigator Documentation   I met with Charles Li after his first radiation treatment today. He denies any concerns related to treatment. I then assisted him over to the Radiation clinic for his undertreat visit with Dr. Isidore Moos. He knows to call me if they have any further questions or concerns.   Harlow Asa RN, BSN, OCN Head & Neck Oncology Nurse [place] at [facility] Phone # [phone]  Fax # [phone]

## 2019-12-01 ENCOUNTER — Other Ambulatory Visit: Payer: Self-pay

## 2019-12-01 ENCOUNTER — Ambulatory Visit
Admission: RE | Admit: 2019-12-01 | Discharge: 2019-12-01 | Disposition: A | Discharge status: Home / Self Care | Payer: Medicare HMO | Source: Ambulatory Visit | Attending: Radiation Oncology | Admitting: Radiation Oncology

## 2019-12-01 DIAGNOSIS — C8331 Diffuse large B-cell lymphoma, lymph nodes of head, face, and neck: Secondary | ICD-10-CM | POA: Diagnosis not present

## 2019-12-01 DIAGNOSIS — Z510 Encounter for antineoplastic radiation therapy: Secondary | ICD-10-CM | POA: Diagnosis not present

## 2019-12-02 ENCOUNTER — Ambulatory Visit
Admission: RE | Admit: 2019-12-02 | Discharge: 2019-12-02 | Disposition: A | Discharge status: Home / Self Care | Payer: Medicare HMO | Source: Ambulatory Visit | Attending: Radiation Oncology | Admitting: Radiation Oncology

## 2019-12-02 ENCOUNTER — Other Ambulatory Visit: Payer: Self-pay

## 2019-12-02 DIAGNOSIS — C8331 Diffuse large B-cell lymphoma, lymph nodes of head, face, and neck: Secondary | ICD-10-CM | POA: Diagnosis not present

## 2019-12-02 DIAGNOSIS — Z510 Encounter for antineoplastic radiation therapy: Secondary | ICD-10-CM | POA: Diagnosis not present

## 2019-12-03 ENCOUNTER — Ambulatory Visit
Admission: RE | Admit: 2019-12-03 | Discharge: 2019-12-03 | Disposition: A | Discharge status: Home / Self Care | Payer: Medicare HMO | Source: Ambulatory Visit | Attending: Radiation Oncology | Admitting: Radiation Oncology

## 2019-12-03 ENCOUNTER — Encounter (HOSPITAL_COMMUNITY): Payer: Self-pay | Admitting: Nurse Practitioner

## 2019-12-03 ENCOUNTER — Ambulatory Visit (HOSPITAL_COMMUNITY)
Admission: RE | Admit: 2019-12-03 | Discharge: 2019-12-03 | Disposition: A | Discharge status: Home / Self Care | Payer: Medicare HMO | Source: Ambulatory Visit | Attending: Nurse Practitioner | Admitting: Nurse Practitioner

## 2019-12-03 ENCOUNTER — Other Ambulatory Visit: Payer: Self-pay

## 2019-12-03 VITALS — BP 108/56 | HR 46 | Ht 70.0 in | Wt 189.6 lb

## 2019-12-03 DIAGNOSIS — J45909 Unspecified asthma, uncomplicated: Secondary | ICD-10-CM | POA: Insufficient documentation

## 2019-12-03 DIAGNOSIS — H548 Legal blindness, as defined in USA: Secondary | ICD-10-CM | POA: Diagnosis not present

## 2019-12-03 DIAGNOSIS — Z880 Allergy status to penicillin: Secondary | ICD-10-CM | POA: Diagnosis not present

## 2019-12-03 DIAGNOSIS — Z7901 Long term (current) use of anticoagulants: Secondary | ICD-10-CM | POA: Insufficient documentation

## 2019-12-03 DIAGNOSIS — Z881 Allergy status to other antibiotic agents status: Secondary | ICD-10-CM | POA: Diagnosis not present

## 2019-12-03 DIAGNOSIS — Z888 Allergy status to other drugs, medicaments and biological substances status: Secondary | ICD-10-CM | POA: Diagnosis not present

## 2019-12-03 DIAGNOSIS — C8591 Non-Hodgkin lymphoma, unspecified, lymph nodes of head, face, and neck: Secondary | ICD-10-CM | POA: Diagnosis not present

## 2019-12-03 DIAGNOSIS — I10 Essential (primary) hypertension: Secondary | ICD-10-CM | POA: Diagnosis not present

## 2019-12-03 DIAGNOSIS — I4891 Unspecified atrial fibrillation: Secondary | ICD-10-CM

## 2019-12-03 DIAGNOSIS — Z79899 Other long term (current) drug therapy: Secondary | ICD-10-CM | POA: Insufficient documentation

## 2019-12-03 DIAGNOSIS — Z9221 Personal history of antineoplastic chemotherapy: Secondary | ICD-10-CM | POA: Insufficient documentation

## 2019-12-03 DIAGNOSIS — I872 Venous insufficiency (chronic) (peripheral): Secondary | ICD-10-CM | POA: Insufficient documentation

## 2019-12-03 DIAGNOSIS — Z8249 Family history of ischemic heart disease and other diseases of the circulatory system: Secondary | ICD-10-CM | POA: Insufficient documentation

## 2019-12-03 DIAGNOSIS — E785 Hyperlipidemia, unspecified: Secondary | ICD-10-CM | POA: Diagnosis not present

## 2019-12-03 DIAGNOSIS — C8331 Diffuse large B-cell lymphoma, lymph nodes of head, face, and neck: Secondary | ICD-10-CM | POA: Diagnosis not present

## 2019-12-03 DIAGNOSIS — I4819 Other persistent atrial fibrillation: Secondary | ICD-10-CM | POA: Insufficient documentation

## 2019-12-03 DIAGNOSIS — D6869 Other thrombophilia: Secondary | ICD-10-CM | POA: Diagnosis not present

## 2019-12-03 DIAGNOSIS — Z87891 Personal history of nicotine dependence: Secondary | ICD-10-CM | POA: Diagnosis not present

## 2019-12-03 DIAGNOSIS — Z882 Allergy status to sulfonamides status: Secondary | ICD-10-CM | POA: Diagnosis not present

## 2019-12-03 DIAGNOSIS — Z510 Encounter for antineoplastic radiation therapy: Secondary | ICD-10-CM | POA: Diagnosis not present

## 2019-12-03 LAB — CBC
HCT: 37.8 % — ABNORMAL LOW (ref 39.0–52.0)
Hemoglobin: 12.3 g/dL — ABNORMAL LOW (ref 13.0–17.0)
MCH: 32.6 pg (ref 26.0–34.0)
MCHC: 32.5 g/dL (ref 30.0–36.0)
MCV: 100.3 fL — ABNORMAL HIGH (ref 80.0–100.0)
Platelets: 250 10*3/uL (ref 150–400)
RBC: 3.77 MIL/uL — ABNORMAL LOW (ref 4.22–5.81)
RDW: 14.3 % (ref 11.5–15.5)
WBC: 8 10*3/uL (ref 4.0–10.5)
nRBC: 0 % (ref 0.0–0.2)

## 2019-12-03 LAB — COMPREHENSIVE METABOLIC PANEL
ALT: 14 U/L (ref 0–44)
AST: 18 U/L (ref 15–41)
Albumin: 3.5 g/dL (ref 3.5–5.0)
Alkaline Phosphatase: 45 U/L (ref 38–126)
Anion gap: 10 (ref 5–15)
BUN: 14 mg/dL (ref 8–23)
CO2: 29 mmol/L (ref 22–32)
Calcium: 9.3 mg/dL (ref 8.9–10.3)
Chloride: 101 mmol/L (ref 98–111)
Creatinine, Ser: 0.9 mg/dL (ref 0.61–1.24)
GFR calc Af Amer: >60 mL/min (ref >60)
GFR calc non Af Amer: >60 mL/min (ref >60)
Glucose, Bld: 133 mg/dL — ABNORMAL HIGH (ref 70–99)
Potassium: 4.8 mmol/L (ref 3.5–5.1)
Sodium: 140 mmol/L (ref 135–145)
Total Bilirubin: 0.8 mg/dL (ref 0.3–1.2)
Total Protein: 5.9 g/dL — ABNORMAL LOW (ref 6.5–8.1)

## 2019-12-03 LAB — TSH: TSH: 0.894 u[IU]/mL (ref 0.350–4.500)

## 2019-12-03 MED ORDER — AMLODIPINE BESYLATE 5 MG PO TABS: 2.5000 mg | ORAL_TABLET | Freq: Every day | ORAL | 2 refills | Status: DC

## 2019-12-03 MED ORDER — BENAZEPRIL-HYDROCHLOROTHIAZIDE 20-25 MG PO TABS: 1.0000 | ORAL_TABLET | Freq: Every day | ORAL | 3 refills | Status: DC

## 2019-12-03 NOTE — Progress Notes (Signed)
Primary Care Physician: Eulas Post, MD Referring Physician: Dr. Rayann Heman Cardiologist: Dr. Scarlette Calico is a 79 y.o. male with a h/o afib, lymphoma of neck, legal blindness. Per pt, afib  was found in February of this year, at time of echo. He was unaware and had  slow ventricular response without rate control drugs. He was referred to [place] by oncologist for further evaluation, who referred to Dr. Rayann Heman. The patient at the time of Dr. Jackalyn Lombard visit did not want to start anticoagulation while undergoing chemo. Dr. Rayann Heman did not think PPM was indicated at the time.   He has now finished chemo x 4 weeks and is willing to follow Dr. Jackalyn Lombard plan now of starting anticoagulation and subsequent DCCV after adequate time on drug. Pt has started radiation of his neck sine I saw him last.  He had one dip in plts in March to 72, CBC one month ago plts were in normal range. Per oncologist, ok to start anticoagulation if plts are over 50. Repeat CBC today with start of eliquis showed adequate plt's.  Pt is back in afib 5/27 clinic, 5/27 as he has noted some swelling of his LE's and questioned if it was related to ELiquis use, since this has been the only change in meds. His weight up a few pounds and has swelling of the lower leg's  that does not go down at night.  No abdominal fullness. I discussed that I did not think eliquis was responsible for this unless his blood count had dropped. It may be the afib as well. He has been on the eliquis for 2 weeks now and will set up cardioversion for 6/7. His HR in afib is in the upper 40's. This is his usual in afib. He is not symptomatic with this. In SR, pt reports his HR was in the low 50's.   Today, he denies symptoms of palpitations, chest pain, shortness of breath, orthopnea, PND, lower extremity edema, dizziness, presyncope, syncope, or neurologic sequela. The patient is tolerating medications without difficulties and is otherwise  without complaint today.   Past Medical History:  Diagnosis Date  . Abdominal pain, epigastric 07/14/2009  . Abdominal wall hernia   . ALLERGIC RHINITIS 12/12/2006  . ASTHMA 11/28/2007  . 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 Diffuse large B cell lymphoma ([place])   . GLAUCOMA 12/12/2006  . 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 HERNIA, VENTRAL 01/04/2009  . HYPERLIPIDEMIA 11/28/2007  . HYPERTENSION 12/12/2006  . Leg swelling 04-24-12   occ., none at present  . Persistent atrial fibrillation ([place])   . RENAL CALCULUS 03/24/2010  . VENOUS INSUFFICIENCY 01/18/2010  . Visual disturbance 04-24-12   legally blind   Past Surgical History:  Procedure Laterality Date  . CATARACT EXTRACTION  2009 - approximate   bilat & glaucoma surgery (7 ophth surgeries)  . INGUINAL HERNIA REPAIR  05/02/2012   Procedure: HERNIA REPAIR INGUINAL ADULT BILATERAL;  Surgeon: Shann Medal, MD;  [facility];  Service: General;  Laterality: N/A;  . IR IMAGING GUIDED PORT INSERTION  09/07/2019  . VASECTOMY  04-24-12  . VENTRAL HERNIA REPAIR  05/02/2012   Procedure: LAPAROSCOPIC VENTRAL HERNIA;  Surgeon: Shann Medal, MD;  Location: [facility];  Service: General;  Laterality: N/A;    Current Outpatient Medications  Medication Sig Dispense Refill  . amLODipine (NORVASC) 5 MG tablet TAKE 1 TABLET BY MOUTH EVERY DAY 90 tablet 2  . apixaban (ELIQUIS) 5 MG TABS  tablet Take 1 tablet (5 mg total) by mouth 2 (two) times daily. 180 tablet 3  . benazepril-hydrochlorthiazide (LOTENSIN HCT) 20-12.5 MG tablet Take 1 tablet by mouth daily. 90 tablet 3  . dorzolamide-timolol (COSOPT) 22.3-6.8 MG/ML ophthalmic solution Place 1 drop into both eyes 2 times daily.    . Multiple Vitamin (MULTIVITAMIN) capsule Take 1 capsule by mouth daily.    . Netarsudil-Latanoprost (ROCKLATAN) 0.02-0.005 % SOLN Place 1 drop into both eyes daily.    . pravastatin (PRAVACHOL) 40 MG tablet Take 1 tablet (40 mg total) by mouth daily. Please schedule an appointment for  further refills. [phone] 90 tablet 3   No current facility-administered medications for this encounter.    Allergies  Allergen Reactions  . Dorzolamide Anaphylaxis and Dermatitis    Irritation on upper lids   . Acetazolamide Diarrhea    Lost weight also Lost weight also   . Bimatoprost Dermatitis and Other (See Comments)    Unknown Unknown   . Brimonidine Other (See Comments)    Unknown Unknown   . Sulfa Antibiotics   . Sulfamethoxazole Hives  . Dorzolamide Hcl-Timolol Mal Rash    itching itching   . Erythromycin Rash  . Penicillins Rash  . Sulfonamide Derivatives Rash    Social History   Socioeconomic History  . Marital status: Married    Spouse name: Not on file  . Number of children: Not on file  . Years of education: Not on file  . Highest education level: Not on file  Occupational History  . Not on file  Tobacco Use  . Smoking status: Former Smoker    Packs/day: 1.50    Years: 14.00    Pack years: 21.00    Types: Cigarettes    Quit date: 11/06/1969    Years since quitting: 50.1  . Smokeless tobacco: Never Used  Substance and Sexual Activity  . Alcohol use: Yes    Alcohol/week: 14.0 standard drinks    Types: 14 Glasses of wine per week    Comment: wine & beer - 3 glasses daily; Waits 10 days after chemo treatment  . Drug use: No  . Sexual activity: Yes  Other Topics Concern  . Not on file  Social History Narrative   Lives in [place] with wife.      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 Difficulty of Paying Living Expenses:   Food Insecurity:   . Worried About Charity fundraiser in the Last Year:   . Arboriculturist in the Last Year:   Transportation Needs:   . Film/video editor (Medical):   Marland Kitchen Lack of Transportation (Non-Medical):   Physical Activity:   . Days of Exercise per Week:   . Minutes of Exercise per Session:   Stress:   . Feeling of Stress :     Social Connections:   . Frequency of Communication with Friends and Family:   . Frequency of Social Gatherings with Friends and Family:   . Attends Religious Services:   . Active Member of Clubs or Organizations:   . Attends Archivist Meetings:   Marland Kitchen Marital Status:   Intimate Partner Violence:   . Fear of Current or Ex-Partner:   . Emotionally Abused:   Marland Kitchen Physically Abused:   . Sexually Abused:     Family History  Problem Relation Age of Onset  . Cancer Mother   .  Pneumonia Father   . Heart disease Brother        congen heart disease  . Birth defects Paternal Aunt        lung  . Colon cancer Neg Hx   . Stomach cancer Neg Hx     ROS- All systems are reviewed and negative except as per the HPI above  Physical Exam: Vitals:   12/03/19 0846  BP: (!) 108/56  Pulse: (!) 46  Weight: 86 kg  Height: 5\' 10"  (1.778 m)   Wt Readings from Last 3 Encounters:  12/03/19 86 kg  11/24/19 83.8 kg  11/23/19 83.9 kg    Labs: Lab Results  Component Value Date   NA 142 11/23/2019   K 3.6 11/23/2019   CL 104 11/23/2019   CO2 32 11/23/2019   GLUCOSE 133 (H) 11/23/2019   BUN 16 11/23/2019   CREATININE 0.86 11/23/2019   CALCIUM 9.3 11/23/2019   MG 1.8 11/23/2019   No results found for: INR Lab Results  Component Value Date   CHOL 143 07/14/2018   HDL 63.80 07/14/2018   LDLCALC 64 07/14/2018   TRIG 73.0 07/14/2018     GEN- The patient is well appearing, alert and oriented x 3 today.   Head- normocephalic, atraumatic Eyes-  Sclera clear, conjunctiva pink Ears- hearing intact Oropharynx- clear Neck- supple, no JVP Lymph- no cervical lymphadenopathy Lungs- Clear to ausculation bilaterally, normal work of breathing Heart- irregular rate and rhythm, no murmurs, rubs or gallops, PMI not laterally displaced GI- soft, NT, ND, + BS Extremities- no clubbing, cyanosis, or edema MS- no significant deformity or atrophy Skin- no rash or lesion Psych- euthymic mood,  full affect Neuro- strength and sensation are intact  EKG-afib at 46 bpm, qrs int 92 ms, qtc 383 ms   Echo- 08/2019-  1. Pt in atrial fibrillation during the study; normal LV function; mild  LVH; mild biatrial enlargement; trace AI.  2. Left ventricular ejection fraction, by estimation, is 60 to 65%. The  left ventricle has normal function. The left ventricle has no regional  wall motion abnormalities. There is mild left ventricular hypertrophy.  Left ventricular diastolic function  could not be evaluated. Left ventricular diastolic function could not be  evaluated.  3. Right ventricular systolic function is normal. The right ventricular  size is normal. There is normal pulmonary artery systolic pressure.  4. Left atrial size was mildly dilated.  5. Right atrial size was mildly dilated.  6. The mitral valve is normal in structure and function. Trivial mitral  valve regurgitation. No evidence of mitral stenosis.  7. The aortic valve is tricuspid. Aortic valve regurgitation is trivial.  Mild aortic valve sclerosis is present, with no evidence of aortic valve  stenosis.  8. The inferior vena cava is normal in size with greater than 50%  respiratory variability, suggesting right atrial pressure of 3 mmHg.   FINDINGS  Left Ventricle: Left ventricular ejection fraction, by estimation, is 60  to 65%. The left ventricle has normal function. The left ventricle has no  regional wall motion abnormalities. The left ventricular internal cavity  size was normal in size. There is  mild left ventricular hypertrophy. Left ventricular diastolic function  could not be evaluated due to atrial fibrillation. Left ventricular  diastolic function could not be evaluated.   Right Ventricle: The right ventricular size is normal.Right ventricular  systolic function is normal. There is normal pulmonary artery systolic  pressure. The tricuspid regurgitant velocity is 2.40 m/s, and  with an  assumed  right atrial pressure of 3 mmHg,  the estimated right ventricular systolic pressure is [id number] mmHg.   Assessment and Plan:  1. New onset asymptomatic  afib with SVR,  Dx 08/2019 Discussed with pt and he would like to proceed with Dr. Jackalyn Lombard plan of start of anticoagulation and subsequent cardioversion after at least 3 weeks of anticoagulation. He has now been on eliquis 5 mg bid x 2 weeks  He has noted new LLE which I do not think is from the eliquis I will recheck cbc, cmet, tsh today and I stat am of DCCV I discussed starting prn  low dose lasix but he is allergic to sulfa which be contraindicated  for use of lasix so I will rx an extra 12.5 of HCTZ for LLE swelling as needed  He will cut amlodipine in half to 2.5 mg daily   2. CHA2DS2VASc score of 3 Continue eliquis 5 mg bid, no missed doses since starting 5/13 pm Reminded not to miss any doses between now and cardioversion date of 6/7  3. Lymphoma  Receiving radiation to neck  Per oncology    Geroge Baseman. Sierah Lacewell, [facility] [address] [place], [place] [address] [phone]

## 2019-12-03 NOTE — Patient Instructions (Addendum)
Cardioversion scheduled for Wednesday, June 9th  - Arrive at the [facility] and go to admitting at [facility] not eat or drink anything after midnight the night prior to your procedure.  - Take all your morning medication (except diabetic medications) with a sip of water prior to arrival.  - You will not be able to drive home after your procedure.  - Do NOT miss any doses of your blood thinner - if you should miss a dose please notify our office immediately.  Decrease amlodipine to 1/2 tablet once a day  Change lotensin to 20/25 once a day. New prescription has been sent to pharmacy.

## 2019-12-03 NOTE — H&P (View-Only) (Signed)
Primary Care Physician: Eulas Post, MD Referring Physician: Dr. Rayann Heman Cardiologist: Dr. Scarlette Calico is a 79 y.o. male with a h/o afib, lymphoma of neck, legal blindness. Per pt, afib  was found in February of this year, at time of echo. He was unaware and had  slow ventricular response without rate control drugs. He was referred to [place] by oncologist for further evaluation, who referred to Dr. Rayann Heman. The patient at the time of Dr. Jackalyn Lombard visit did not want to start anticoagulation while undergoing chemo. Dr. Rayann Heman did not think PPM was indicated at the time.   He has now finished chemo x 4 weeks and is willing to follow Dr. Jackalyn Lombard plan now of starting anticoagulation and subsequent DCCV after adequate time on drug. Pt has started radiation of his neck sine I saw him last.  He had one dip in plts in March to 72, CBC one month ago plts were in normal range. Per oncologist, ok to start anticoagulation if plts are over 50. Repeat CBC today with start of eliquis showed adequate plt's.  Pt is back in afib 5/27 clinic, 5/27 as he has noted some swelling of his LE's and questioned if it was related to ELiquis use, since this has been the only change in meds. His weight up a few pounds and has swelling of the lower leg's  that does not go down at night.  No abdominal fullness. I discussed that I did not think eliquis was responsible for this unless his blood count had dropped. It may be the afib as well. He has been on the eliquis for 2 weeks now and will set up cardioversion for 6/7. His HR in afib is in the upper 40's. This is his usual in afib. He is not symptomatic with this. In SR, pt reports his HR was in the low 50's.   Today, he denies symptoms of palpitations, chest pain, shortness of breath, orthopnea, PND, lower extremity edema, dizziness, presyncope, syncope, or neurologic sequela. The patient is tolerating medications without difficulties and is otherwise  without complaint today.   Past Medical History:  Diagnosis Date  . Abdominal pain, epigastric 07/14/2009  . Abdominal wall hernia   . ALLERGIC RHINITIS 12/12/2006  . ASTHMA 11/28/2007  . 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 Diffuse large B cell lymphoma ([place])   . GLAUCOMA 12/12/2006  . 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 HERNIA, VENTRAL 01/04/2009  . HYPERLIPIDEMIA 11/28/2007  . HYPERTENSION 12/12/2006  . Leg swelling 04-24-12   occ., none at present  . Persistent atrial fibrillation ([place])   . RENAL CALCULUS 03/24/2010  . VENOUS INSUFFICIENCY 01/18/2010  . Visual disturbance 04-24-12   legally blind   Past Surgical History:  Procedure Laterality Date  . CATARACT EXTRACTION  2009 - approximate   bilat & glaucoma surgery (7 ophth surgeries)  . INGUINAL HERNIA REPAIR  05/02/2012   Procedure: HERNIA REPAIR INGUINAL ADULT BILATERAL;  Surgeon: Shann Medal, MD;  Location: [facility];  Service: General;  Laterality: N/A;  . IR IMAGING GUIDED PORT INSERTION  09/07/2019  . VASECTOMY  04-24-12  . VENTRAL HERNIA REPAIR  05/02/2012   Procedure: LAPAROSCOPIC VENTRAL HERNIA;  Surgeon: Shann Medal, MD;  Location: [facility];  Service: General;  Laterality: N/A;    Current Outpatient Medications  Medication Sig Dispense Refill  . amLODipine (NORVASC) 5 MG tablet TAKE 1 TABLET BY MOUTH EVERY DAY 90 tablet 2  . apixaban (ELIQUIS) 5 MG TABS  tablet Take 1 tablet (5 mg total) by mouth 2 (two) times daily. 180 tablet 3  . benazepril-hydrochlorthiazide (LOTENSIN HCT) 20-12.5 MG tablet Take 1 tablet by mouth daily. 90 tablet 3  . dorzolamide-timolol (COSOPT) 22.3-6.8 MG/ML ophthalmic solution Place 1 drop into both eyes 2 times daily.    . Multiple Vitamin (MULTIVITAMIN) capsule Take 1 capsule by mouth daily.    . Netarsudil-Latanoprost (ROCKLATAN) 0.02-0.005 % SOLN Place 1 drop into both eyes daily.    . pravastatin (PRAVACHOL) 40 MG tablet Take 1 tablet (40 mg total) by mouth daily. Please schedule an appointment for  further refills. [phone] 90 tablet 3   No current facility-administered medications for this encounter.    Allergies  Allergen Reactions  . Dorzolamide Anaphylaxis and Dermatitis    Irritation on upper lids   . Acetazolamide Diarrhea    Lost weight also Lost weight also   . Bimatoprost Dermatitis and Other (See Comments)    Unknown Unknown   . Brimonidine Other (See Comments)    Unknown Unknown   . Sulfa Antibiotics   . Sulfamethoxazole Hives  . Dorzolamide Hcl-Timolol Mal Rash    itching itching   . Erythromycin Rash  . Penicillins Rash  . Sulfonamide Derivatives Rash    Social History   Socioeconomic History  . Marital status: Married    Spouse name: Not on file  . Number of children: Not on file  . Years of education: Not on file  . Highest education level: Not on file  Occupational History  . Not on file  Tobacco Use  . Smoking status: Former Smoker    Packs/day: 1.50    Years: 14.00    Pack years: 21.00    Types: Cigarettes    Quit date: 11/06/1969    Years since quitting: 50.1  . Smokeless tobacco: Never Used  Substance and Sexual Activity  . Alcohol use: Yes    Alcohol/week: 14.0 standard drinks    Types: 14 Glasses of wine per week    Comment: wine & beer - 3 glasses daily; Waits 10 days after chemo treatment  . Drug use: No  . Sexual activity: Yes  Other Topics Concern  . Not on file  Social History Narrative   Lives in [place] with wife.      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 Difficulty of Paying Living Expenses:   Food Insecurity:   . Worried About Charity fundraiser in the Last Year:   . Arboriculturist in the Last Year:   Transportation Needs:   . Film/video editor (Medical):   Marland Kitchen Lack of Transportation (Non-Medical):   Physical Activity:   . Days of Exercise per Week:   . Minutes of Exercise per Session:   Stress:   . Feeling of Stress :     Social Connections:   . Frequency of Communication with Friends and Family:   . Frequency of Social Gatherings with Friends and Family:   . Attends Religious Services:   . Active Member of Clubs or Organizations:   . Attends Archivist Meetings:   Marland Kitchen Marital Status:   Intimate Partner Violence:   . Fear of Current or Ex-Partner:   . Emotionally Abused:   Marland Kitchen Physically Abused:   . Sexually Abused:     Family History  Problem Relation Age of Onset  . Cancer Mother   .  Pneumonia Father   . Heart disease Brother        congen heart disease  . Birth defects Paternal Aunt        lung  . Colon cancer Neg Hx   . Stomach cancer Neg Hx     ROS- All systems are reviewed and negative except as per the HPI above  Physical Exam: Vitals:   12/03/19 0846  BP: (!) 108/56  Pulse: (!) 46  Weight: 86 kg  Height: 5\' 10"  (1.778 m)   Wt Readings from Last 3 Encounters:  12/03/19 86 kg  11/24/19 83.8 kg  11/23/19 83.9 kg    Labs: Lab Results  Component Value Date   NA 142 11/23/2019   K 3.6 11/23/2019   CL 104 11/23/2019   CO2 32 11/23/2019   GLUCOSE 133 (H) 11/23/2019   BUN 16 11/23/2019   CREATININE 0.86 11/23/2019   CALCIUM 9.3 11/23/2019   MG 1.8 11/23/2019   No results found for: INR Lab Results  Component Value Date   CHOL 143 07/14/2018   HDL 63.80 07/14/2018   LDLCALC 64 07/14/2018   TRIG 73.0 07/14/2018     GEN- The patient is well appearing, alert and oriented x 3 today.   Head- normocephalic, atraumatic Eyes-  Sclera clear, conjunctiva pink Ears- hearing intact Oropharynx- clear Neck- supple, no JVP Lymph- no cervical lymphadenopathy Lungs- Clear to ausculation bilaterally, normal work of breathing Heart- irregular rate and rhythm, no murmurs, rubs or gallops, PMI not laterally displaced GI- soft, NT, ND, + BS Extremities- no clubbing, cyanosis, or edema MS- no significant deformity or atrophy Skin- no rash or lesion Psych- euthymic mood,  full affect Neuro- strength and sensation are intact  EKG-afib at 46 bpm, qrs int 92 ms, qtc 383 ms   Echo- 08/2019-  1. Pt in atrial fibrillation during the study; normal LV function; mild  LVH; mild biatrial enlargement; trace AI.  2. Left ventricular ejection fraction, by estimation, is 60 to 65%. The  left ventricle has normal function. The left ventricle has no regional  wall motion abnormalities. There is mild left ventricular hypertrophy.  Left ventricular diastolic function  could not be evaluated. Left ventricular diastolic function could not be  evaluated.  3. Right ventricular systolic function is normal. The right ventricular  size is normal. There is normal pulmonary artery systolic pressure.  4. Left atrial size was mildly dilated.  5. Right atrial size was mildly dilated.  6. The mitral valve is normal in structure and function. Trivial mitral  valve regurgitation. No evidence of mitral stenosis.  7. The aortic valve is tricuspid. Aortic valve regurgitation is trivial.  Mild aortic valve sclerosis is present, with no evidence of aortic valve  stenosis.  8. The inferior vena cava is normal in size with greater than 50%  respiratory variability, suggesting right atrial pressure of 3 mmHg.   FINDINGS  Left Ventricle: Left ventricular ejection fraction, by estimation, is 60  to 65%. The left ventricle has normal function. The left ventricle has no  regional wall motion abnormalities. The left ventricular internal cavity  size was normal in size. There is  mild left ventricular hypertrophy. Left ventricular diastolic function  could not be evaluated due to atrial fibrillation. Left ventricular  diastolic function could not be evaluated.   Right Ventricle: The right ventricular size is normal.Right ventricular  systolic function is normal. There is normal pulmonary artery systolic  pressure. The tricuspid regurgitant velocity is 2.40 m/s, and  with an  assumed  right atrial pressure of 3 mmHg,  the estimated right ventricular systolic pressure is [id number] mmHg.   Assessment and Plan:  1. New onset asymptomatic  afib with SVR,  Dx 08/2019 Discussed with pt and he would like to proceed with Dr. Jackalyn Lombard plan of start of anticoagulation and subsequent cardioversion after at least 3 weeks of anticoagulation. He has now been on eliquis 5 mg bid x 2 weeks  He has noted new LLE which I do not think is from the eliquis I will recheck cbc, cmet, tsh today and I stat am of DCCV I discussed starting prn  low dose lasix but he is allergic to sulfa which be contraindicated  for use of lasix so I will rx an extra 12.5 of HCTZ for LLE swelling as needed  He will cut amlodipine in half to 2.5 mg daily   2. CHA2DS2VASc score of 3 Continue eliquis 5 mg bid, no missed doses since starting 5/13 pm Reminded not to miss any doses between now and cardioversion date of 6/7  3. Lymphoma  Receiving radiation to neck  Per oncology    Geroge Baseman. Therisa Mennella, [facility] [address] [place], [place] [address] [phone]

## 2019-12-04 ENCOUNTER — Other Ambulatory Visit: Payer: Self-pay

## 2019-12-04 ENCOUNTER — Ambulatory Visit
Admission: RE | Admit: 2019-12-04 | Discharge: 2019-12-04 | Disposition: A | Discharge status: Home / Self Care | Payer: Medicare HMO | Source: Ambulatory Visit | Attending: Radiation Oncology | Admitting: Radiation Oncology

## 2019-12-04 DIAGNOSIS — C8331 Diffuse large B-cell lymphoma, lymph nodes of head, face, and neck: Secondary | ICD-10-CM | POA: Diagnosis not present

## 2019-12-04 DIAGNOSIS — Z510 Encounter for antineoplastic radiation therapy: Secondary | ICD-10-CM | POA: Diagnosis not present

## 2019-12-08 ENCOUNTER — Ambulatory Visit (HOSPITAL_COMMUNITY): Payer: Medicare HMO | Admitting: Nurse Practitioner

## 2019-12-08 ENCOUNTER — Other Ambulatory Visit: Payer: Self-pay | Admitting: Hematology and Oncology

## 2019-12-08 ENCOUNTER — Ambulatory Visit
Admission: RE | Admit: 2019-12-08 | Discharge: 2019-12-08 | Disposition: A | Discharge status: Home / Self Care | Payer: Medicare HMO | Source: Ambulatory Visit | Attending: Radiation Oncology | Admitting: Radiation Oncology

## 2019-12-08 ENCOUNTER — Other Ambulatory Visit: Payer: Self-pay

## 2019-12-08 DIAGNOSIS — C8331 Diffuse large B-cell lymphoma, lymph nodes of head, face, and neck: Secondary | ICD-10-CM | POA: Insufficient documentation

## 2019-12-08 DIAGNOSIS — Z510 Encounter for antineoplastic radiation therapy: Secondary | ICD-10-CM | POA: Insufficient documentation

## 2019-12-08 NOTE — Progress Notes (Signed)
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 Lucia Gaskins for tonsillar mass present for a few weeks. 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 FISH studies pending.  2) 08/17/2019: establish care with Dr. Lorenso Courier   3) 08/28/2019: PET CT scan reveals activity in left lingual tonsil, level 2 lymph node, and level 3 lymph node. Consistent with Stage I 4) 09/04/2019: Bone marrow biopsy shows no evidence of lymphoma in the bone marrow.  5) 09/04/2019; TTE showed EF 60-65%, however patient found to have new atrial fibrillation.  6) 09/14/2019: started R-CEOP chemotherapy, Cycle 1 Day 1 7) 10/05/2019: R-CEOP chemotherapy, Cycle 2 Day 1 8) 10/26/2019: R-CEOP chemotherapy Cycle 3 Day 1 9) 11/30/2019: start Radiation therapy with Dr. Isidore Moos  HISTORY OF PRESENTING ILLNESS:  Charles Li 79 y.o. male with medical history significant for left tonsillar B cell lymphoma presents for f/u. He was last seen on 11/23/2019 in our clinic. In the interim he has started radiation therapy with Dr. Isidore Moos on 11/30/2019.   On exam today Mr. Dotson notes he was having some prior issues with his blood pressure medications.  He notes that he was having pain and swelling in his lower extremities and that he was told to decrease his amlodipine dose to 2.5 mg.  At that time he came to the realization that he had been sent a prescription for 10 mg and he  had been taking higher than the recommended dose by his primary care provider.  He continues to have some swelling in his lower extremities, but markedly improved from the decrease in dose back down to 2.5 mg.  He notes that he is no longer having the "excruciating pain" that he was experiencing with his leg swelling.  Both legs swelled at the same time to approximately the same size.  Overall Mr. Rockett has tolerated his radiation therapy well.  He is received 6 doses of radiation with the seventh dose plan today.  He is expected to receive a total of 17 fractions.  He is beginning to have some soreness in the back of his throat with swallowing.  Otherwise he is not having any rash, dry mouth, or other concerning symptoms at this time.  A full 10 point ROS is listed below.  MEDICAL HISTORY:  Past Medical History:  Diagnosis Date  . Abdominal pain, epigastric 07/14/2009  . Abdominal wall hernia   . ALLERGIC RHINITIS 12/12/2006  . ASTHMA 11/28/2007  . 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 Diffuse large B cell lymphoma ([place])   . GLAUCOMA 12/12/2006  . 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 HERNIA, VENTRAL 01/04/2009  . HYPERLIPIDEMIA 11/28/2007  . HYPERTENSION 12/12/2006  . Leg swelling 04-24-12   occ., none at present  . Persistent atrial fibrillation ([place])   . RENAL CALCULUS 03/24/2010  . VENOUS INSUFFICIENCY 01/18/2010  . Visual disturbance 04-24-12   legally blind    ALLERGIES:  is allergic to dorzolamide; acetazolamide; bimatoprost; brimonidine; sulfa antibiotics; sulfamethoxazole; dorzolamide hcl-timolol mal; erythromycin; penicillins; and sulfonamide derivatives.  MEDICATIONS:  Current Outpatient Medications  Medication Sig Dispense Refill  . amLODipine (NORVASC) 5 MG tablet Take 0.5 tablets (2.5 mg total) by mouth daily. (Patient taking differently: Take 5 mg by mouth daily. ) 90 tablet 2  . apixaban (ELIQUIS) 5 MG TABS tablet Take 1 tablet (5 mg total) by mouth 2 (two) times daily. 180 tablet 3  .  benazepril-hydrochlorthiazide (LOTENSIN HCT) 20-25 MG tablet Take 1 tablet by mouth daily. 30 tablet 3  . dorzolamide-timolol (COSOPT) 22.3-6.8 MG/ML ophthalmic solution Place 1 drop into both eyes 2 (two) times daily.     . Multiple Vitamin (MULTIVITAMIN WITH MINERALS) TABS tablet Take 1 tablet by mouth daily.    . Netarsudil-Latanoprost (ROCKLATAN) 0.02-0.005 % SOLN Place 1 drop into both eyes at bedtime.     . pravastatin (PRAVACHOL) 40 MG tablet Take 1 tablet (40 mg total) by mouth daily. Please schedule an appointment for further refills. [phone] (Patient taking differently: Take 40 mg by mouth every evening. Please schedule an appointment for further refills. [phone]) 90 tablet 3   No current facility-administered medications for this visit.    REVIEW OF SYSTEMS:   Constitutional: ( - ) fevers, ( - )  chills , ( - ) night sweats (+) hair loss Eyes: ( - ) blurriness of vision, ( - ) double vision, ( - ) watery eyes Ears, nose, mouth, throat, and face: ( - ) mucositis, ( + ) sore throat Respiratory: ( - ) cough, ( - ) dyspnea, ( - ) wheezes Cardiovascular: ( - ) palpitation, ( - ) chest discomfort, ( - ) lower extremity swelling Gastrointestinal:  ( - ) nausea, ( - ) heartburn, ( - ) change in bowel habits Skin: ( - ) abnormal skin rashes Lymphatics: ( - ) new lymphadenopathy, ( - ) easy bruising Neurological: ( - ) numbness, ( - ) tingling, ( - ) new weaknesses Behavioral/Psych: ( - ) mood change, ( - ) new changes  All other systems were reviewed with the patient and are negative.  PHYSICAL EXAMINATION: ECOG PERFORMANCE STATUS: 1 - Symptomatic but completely ambulatory  Vitals:   12/09/19 1002  BP: 128/72  Pulse: 61  Resp: 18  Temp: 97.7 F (36.5 C)  SpO2: 99%   Filed Weights   12/09/19 1002  Weight: 184 lb 12.8 oz (83.8 kg)    GENERAL: well appearing elderly Caucasian male in NAD, balding with considerably thinner hair on his head  SKIN: skin color, texture,  turgor are normal, no rashes or significant lesions OROPHARYNX: no swelling, erythema, enlarged tonsils or new lesions.  EYES: conjunctiva are pink and non-injected, sclera clear LUNGS: clear to auscultation and percussion with normal breathing effort HEART: regular rate & rhythm and no murmurs and no lower extremity edema Musculoskeletal: no cyanosis of digits and no clubbing  PSYCH: alert & oriented x 3, fluent speech NEURO: no focal motor/sensory deficits  LABORATORY DATA:  I have reviewed the data as listed CBC Latest Ref Rng & Units 12/09/2019 12/03/2019 11/23/2019  WBC 4.0 - 10.5 K/uL 7.3 8.0 9.3  Hemoglobin 13.0 - 17.0 g/dL 12.3(L) 12.3(L) 12.3(L)  Hematocrit 39.0 - 52.0 % 35.6(L) 37.8(L) 36.1(L)  Platelets 150 - 400 K/uL 219 250 245    CMP Latest Ref Rng & Units 12/09/2019 12/03/2019 11/23/2019  Glucose 70 - 99 mg/dL 115(H) 133(H) 133(H)  BUN 8 - 23 mg/dL 22 14 16   Creatinine 0.61 - 1.24 mg/dL 0.87 0.90 0.86  Sodium 135 - 145 mmol/L 141 140 142  Potassium 3.5 - 5.1 mmol/L 3.7 4.8 3.6  Chloride 98 - 111 mmol/L 104 101 104  CO2 22 - 32 mmol/L 27 29 32  Calcium 8.9 - 10.3 mg/dL 9.2 9.3 9.3  Total Protein 6.5 - 8.1 g/dL 5.8(L) 5.9(L) 5.9(L)  Total Bilirubin 0.3 - 1.2 mg/dL 1.0 0.8 0.7  Alkaline Phos 38 - 126 U/L 48 45 62  AST 15 - 41 U/L 15 18 14(L)  ALT 0 - 44 U/L 13 14 13     PATHOLOGY:  SURGICAL PATHOLOGY  CASE: [id number]  PATIENT: Quantrell Okun  Surgical Pathology Report   Clinical History: tonsil cancer (cm)   FINAL MICROSCOPIC DIAGNOSIS:   A. TONSIL, LEFT, BIOPSY:  - Large B-cell lymphoma  - See comment   COMMENT:  The tonsillar tissue has an ulcerated surface with underlying atypical  cellular proliferation. By immunohistochemistry, the neoplastic cells  are positive for CD20, CD10, BCL6 and BCL2 (weak) but negative for CD3,  CD56, CD5, Mum-1, cytokeratin AE1/3 and EBV by in situ hybridization.  The proliferative rate by Ki-67 is 70 to 80%. Overall,  the features are  consistent with a large B-cell lymphoma. The differential diagnosis  includes diffuse large B-cell lymphoma and high-grade B-cell lymphoma  (double hit). FISH is pending and will be reported in an addendum.   Preliminary results of this case were given to Dr. Lucia Gaskins on August 14, 2019.   Dr. Tresa Moore reviewed the case and agrees with the above diagnosis.   GROSS DESCRIPTION:   The specimen is received in formalin and consists of a 0.9 x 0.4 x 0.4  cm piece of tan soft tissue. The specimen is entirely submitted in 1  cassette. Craig Staggers 08/14/2019)   Final Diagnosis performed by Thressa Sheller, MD.  Electronically signed  08/14/2019  Technical and / or Professional components performed at [facility]. [facility], [place] [address] [place], [place] [address].  Immunohistochemistry Technical component (if applicable) was performed  at [facility]. [address] [place],  [place], [place] [address].  IMMUNOHISTOCHEMISTRY DISCLAIMER (if applicable):  Some of these immunohistochemical stains may have been developed and the  performance characteristics determine by [facility]. Some  may not have been cleared or approved by the U.S. Food and Drug  Administration. The FDA has determined that such clearance or approval  is not necessary. This test is used for clinical purposes. It should not  be regarded as investigational or for research. This laboratory is  certified under the [place]  (CLIA-88) as qualified to perform high complexity clinical laboratory  testing. The controls stained appropriately.  SURGICAL PATHOLOGY  ** THIS IS AN ADDENDUM REPORT **  CASE: [id number]  PATIENT: Clearence Reinertsen  Bone Marrow Report  **Addendum **   Reason for Addendum #1: Cytogenetics results   Clinical History: lymphoma, DLBCL, left posterior iliac, (ADC)    DIAGNOSIS:   BONE MARROW, ASPIRATE,  CLOT, CORE:  - Normocellular marrow with trilineage hematopoiesis  - No lymphoma identified   PERIPHERAL BLOOD:  - Morphologically unremarkable  - See complete blood cell count   MICROSCOPIC DESCRIPTION:   PERIPHERAL BLOOD SMEAR: The peripheral blood is morphologically  unremarkable.   BONE MARROW ASPIRATE: Spicular, cellular and adequate for evaluation  Erythroid precursors: Orderly maturation without overt dysplasia  Granulocytic precursors: Orderly maturation without overt dysplasia  Megakaryocytes: Qualitatively and quantitatively unremarkable  Lymphocytes/plasma cells: No lymphocytosis or plasmacytosis   TOUCH PREPARATIONS: Hypocellular with no additional  findings ascompared  to the aspirate smears.   CLOT AND BIOPSY: Examination of the core biopsy and clot section reveals  a normocellular bone marrow (30-40%) with trilineage hematopoiesis.  Myeloid and erythroid elements are present in essentially normal  proportions. Megakaryocytes display a spectrum of maturation without  clustering. There are no granulomas identified. A single well-formed  lymphoid aggregate is noted on the clot section. By  immunohistochemistry, is composed of an admixture of B and T cells  (CD20, CD3) without aberrant B-cell expression of CD5 or CD10.   IRON STAIN: Iron stains are performed on a bone marrow aspirate or touch  imprint smear and section of clot. The controls stained appropriately.     Storage Iron: Present    Ring Sideroblasts: Not identified   ADDITIONAL DATA/TESTING: Cytogenetics is pending and will be reported  separately. Flow cytometry performed on the bone marrow biopsy did not  identify a monoclonal B-cell or phenotypically aberrant T-cell  population (See [id number]).   CELL COUNT DATA:   Bone Marrow count performed on 500 cells shows:  Blasts:  0%  Myeloid: 57%  Promyelocytes: 0%  Erythroid:   28%  Myelocytes:  10% Lymphocytes:  7%    Metamyelocytes:   2%  Plasma cells: 8%  Bands:  7%  Neutrophils:  33% M:E ratio:   2.03  Eosinophils:  5%  Basophils:   0%  Monocytes:   0%   Lab Data: CBC performed on 09/04/2019 shows:  WBC: 8.6 k/uL Neutrophils:  63%  Hgb: 14.2 g/dL Lymphocytes:  30%  HCT: 41.2 %  Monocytes:   3%  MCV: 94.7 fL  Eosinophils:  4%  RDW: 11.8 %  Basophils:   0%  PLT: 263 k/uL   GROSS DESCRIPTION:   A: Aspirate smear   B: Received in B-plus fixative are tissue fragments measuring 2.1 x 1.0  x 0.3 cm in aggregate. The specimen is submitted in total.   C: Received in B-plus fixative is a 2.0 x 0.2 cm core of bone which is  submitted in toto fine decalcification. [facility] 09/04/2019)    Final Diagnosis performed by Thressa Sheller, MD.  Electronically signed  09/08/2019  Technical component performed at [facility]  [address], [place], [place] [address].  Professional component performed at [employer]. [facility],  [place] [address] [place], [place] [address].  Immunohistochemistry Technical component (if applicable) was performed  at [facility]. [address], [place],  [place], [place] [address].  IMMUNOHISTOCHEMISTRY DISCLAIMER (if applicable):  Some of these immunohistochemical stains may have been developed and the  performance characteristics determine by [facility]. Some  may not have been cleared or approved by the U.S. Food and Drug  Administration. The FDA has determined that such clearance or approval  is not necessary. This test is used for clinical purposes. It should not  be regarded as investigational or for research. This laboratory is  certified under the [place]  (CLIA-88) as qualified to perform high complexity clinical laboratory  testing. The controls stained appropriately.   ADDENDUM:   ** Please note this testing was performed and  interpreted by an outside  facility. This addendum is only being added to provide a summary of the  results for report completeness. Please see electronic medical record  for a copy of the full report. **   Karyotype: 47,?XY,?del(?9)?(?q13q22)?,?+?mar[?3]?/?46,?XY[?20]?  Interpretation: ABNORMAL MALE KARYOTYPE   Cytogenetic analysis shows an abnormal male  karyotype. 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 The remaining twenty cells show a normal  karyotype.   Deletion of chromosome 9q occurs in both myeloid (more common) and  lymphoid disorders, including acute myeloid leukemia (AML) (more  commonly) and myelodysplastic syndrome (MDS) (only rarely). In MDS the  prognostic significance would be expected to be intermediate.  In AML  the prognosis is variable. Marker chromosomes and structural  chromosomal aberrations in the absence of autosomal monosomies have  minimal prognostic impact. Correlation with other clinical and  laboratory findings is indicated.    RADIOGRAPHIC STUDIES:  NM PET Image Restag (PS) Skull Base To Thigh  Result Date: 11/17/2019 CLINICAL DATA:  Subsequent treatment strategy for large B-cell lymphoma. EXAM: NUCLEAR MEDICINE PET SKULL BASE TO THIGH TECHNIQUE: 9.28 mCi F-18 FDG was injected intravenously. Full-ring PET imaging was performed from the skull base to thigh after the radiotracer. CT data was obtained and used for attenuation correction and anatomic localization. Fasting blood glucose: 118 mg/dl COMPARISON:  PET-CT 08/28/2019 FINDINGS: Mediastinal blood pool activity: SUV max 2.7 Liver activity: SUV max 3.8 NECK: Resolution of the hypermetabolic LEFT tonsillar tissue as well as hypermetabolic LEFT level II lymph node. Previously the LEFT level II lymph node just anterior to the sternocleidomastoid muscle measured 14 mm with SUV max equal 44.6. This lymph node is not readily measurable  and there is no measurable metabolic activity ( Deauville 1 Likewise small LEFT level III lymph node described on comparison exam currently measures 3 mm (image 39/4) compared to 5 mm without radiotracer activity. No asymmetric or increased metabolic activity in the LEFT oropharynx No new lymph nodes in the neck Incidental CT findings: none CHEST: No hypermetabolic mediastinal nodes. No hypermetabolic axillary nodes Incidental CT findings: Port in the anterior chest wall with tip in distal SVC. A branching nodular lesion in the superior segment of the LEFT lower lobe is near completely resolved (image 81/4) and does not have residual metabolic ABDOMEN/PELVIS: No abnormal hypermetabolic activity within the liver, pancreas, adrenal glands, or spleen. No hypermetabolic lymph nodes in the abdomen or pelvis. Incidental CT findings: Normal volume spleen. Severe RIGHT hydronephrosis secondary to obstructing calculus in the mid ureter is unchanged. SKELETON: No focal hypermetabolic activity to suggest skeletal metastasis. Incidental CT findings: none IMPRESSION: 1. Complete resolution of hypermetabolic adenopathy in the LEFT neck. Deauville 1 2. A complete resolution of the asymmetric hypermetabolic activity in the LEFT tonsil region ( Deauville 1) 3. Resolution of branching nodular densities. Segment of the LEFT lower lobe most consistent with resolution with benign infectious or inflammatory process. 4. Chronic severe RIGHT hydronephrosis related to obstructing RIGHT ureteral calculus. Electronically Signed   By: Suzy Bouchard M.D.   On: 11/17/2019 14:35    ASSESSMENT & PLAN Charles Li 78 y.o. 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   In the interim since his last visit the patient has start radiation therapy and has received 6 of 17 planned fractions, with fraction 7 to be performed today.  The patient is tolerated the treatment well so far with  only some pain on swallowing. He does not currently have any other focal symptoms.  Review of Mr. Knapke's PET CT scan from 11/17/2019 is reassuring with complete resolution of FDG avidity in the involved lymph nodes. We will continue to be involved for symptom management while he  is receiving radiation therapy.  We will schedule follow-up visit at the end of radiation to assure that the patient is still doing well.  The treatment plan was for Rituximab 375 mg/m2, Cyclophosphamide 745m/m2, etoposide IV 528mm2 (followed by 10030m2 PO on Day 2 and 3), and Vincristine 1.4 mg/m2 all on Day 1. The patient will also received PO prednisone 100m25my 1-5 of each 3 week cycle. Patient recieving ISRT with Dr. 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 Bone marrow biopsy confirmed no alternative sites of lymphoma.  -- started R-CEOP chemotherapy on 09/14/2019. This is therapy with curative intent. Etoposide was substituted for anthracycline therapy due to his atrial fibrillation found on TTE.  -- At this time his findings are consistent with a Stage I. Plan is for 3 cycles of R-CEOP with ISRT to site of disease. Interval PET scan on 11/17/2019 showed no active disease. Patient is undergoing radiation therapy and is currently Fraction 7 of 17.  --plan to have patient RTC in 3 weeks to reassess how he is tolerating radiation therapy.   #Oropharynx Pain, recurrent --not currently requiring pain medication  --recommend viscous lidocaine (tried prior for his tumor pain ,though for his current pain it may be more effective).  --additionally can consider warm salt water rinses.  --continue to monitor  #Symptom Management --no longer requires anti-nausea medication. Never needed during chemotherapy.  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 We were agreeable to deferring evaluation to a later time.  --kidney function is stable and he has no urinary/pain symptoms --patient agreeable to having consult placed today.   #Atrial Fibrillation --currently following with cardiology, scheduled for a cardioversion next week.  --no bleeding, bruising, or other issues on Eliquis.   Orders Placed This Encounter  Procedures  . Ambulatory referral to Urology    Referral Priority:   Routine    Referral Type:   Consultation    Referral Reason:   Specialty Services Required    Requested Specialty:   Urology    Number of Visits Requested:   1    All questions were answered. The patient knows to call the clinic with any problems, questions or concerns.  A total of more than 30 minutes were spent on this encounter and over half of that time was spent on counseling and coordination of care as outlined above.   JohnLedell Peoples Department of Hematology/Oncology [facility] [place]: [phone]: [phone]: [email or web address]  12/09/2019 5:00 PM

## 2019-12-09 ENCOUNTER — Inpatient Hospital Stay: Payer: Medicare HMO

## 2019-12-09 ENCOUNTER — Other Ambulatory Visit: Payer: Self-pay

## 2019-12-09 ENCOUNTER — Inpatient Hospital Stay: Payer: Medicare HMO | Attending: Hematology and Oncology | Admitting: Hematology and Oncology

## 2019-12-09 ENCOUNTER — Ambulatory Visit: Payer: Medicare HMO

## 2019-12-09 ENCOUNTER — Ambulatory Visit
Admission: RE | Admit: 2019-12-09 | Discharge: 2019-12-09 | Disposition: A | Discharge status: Home / Self Care | Payer: Medicare HMO | Source: Ambulatory Visit | Attending: Radiation Oncology | Admitting: Radiation Oncology

## 2019-12-09 ENCOUNTER — Encounter: Payer: Self-pay | Admitting: Hematology and Oncology

## 2019-12-09 VITALS — BP 128/72 | HR 61 | Temp 97.7°F | Resp 18 | Ht 70.0 in | Wt 184.8 lb

## 2019-12-09 DIAGNOSIS — Z95828 Presence of other vascular implants and grafts: Secondary | ICD-10-CM

## 2019-12-09 DIAGNOSIS — Z510 Encounter for antineoplastic radiation therapy: Secondary | ICD-10-CM | POA: Diagnosis not present

## 2019-12-09 DIAGNOSIS — C8331 Diffuse large B-cell lymphoma, lymph nodes of head, face, and neck: Secondary | ICD-10-CM | POA: Diagnosis not present

## 2019-12-09 DIAGNOSIS — H548 Legal blindness, as defined in USA: Secondary | ICD-10-CM | POA: Insufficient documentation

## 2019-12-09 DIAGNOSIS — N200 Calculus of kidney: Secondary | ICD-10-CM

## 2019-12-09 DIAGNOSIS — I4819 Other persistent atrial fibrillation: Secondary | ICD-10-CM | POA: Insufficient documentation

## 2019-12-09 DIAGNOSIS — I482 Chronic atrial fibrillation, unspecified: Secondary | ICD-10-CM

## 2019-12-09 DIAGNOSIS — I10 Essential (primary) hypertension: Secondary | ICD-10-CM | POA: Diagnosis not present

## 2019-12-09 DIAGNOSIS — N132 Hydronephrosis with renal and ureteral calculous obstruction: Secondary | ICD-10-CM | POA: Insufficient documentation

## 2019-12-09 LAB — CBC WITH DIFFERENTIAL (CANCER CENTER ONLY)
Abs Immature Granulocytes: 0.02 10*3/uL (ref 0.00–0.07)
Basophils Absolute: 0 10*3/uL (ref 0.0–0.1)
Basophils Relative: 1 %
Eosinophils Absolute: 0.5 10*3/uL (ref 0.0–0.5)
Eosinophils Relative: 6 %
HCT: 35.6 % — ABNORMAL LOW (ref 39.0–52.0)
Hemoglobin: 12.3 g/dL — ABNORMAL LOW (ref 13.0–17.0)
Immature Granulocytes: 0 %
Lymphocytes Relative: 16 %
Lymphs Abs: 1.2 10*3/uL (ref 0.7–4.0)
MCH: 33.1 pg (ref 26.0–34.0)
MCHC: 34.6 g/dL (ref 30.0–36.0)
MCV: 95.7 fL (ref 80.0–100.0)
Monocytes Absolute: 0.9 10*3/uL (ref 0.1–1.0)
Monocytes Relative: 13 %
Neutro Abs: 4.7 10*3/uL (ref 1.7–7.7)
Neutrophils Relative %: 64 %
Platelet Count: 219 10*3/uL (ref 150–400)
RBC: 3.72 MIL/uL — ABNORMAL LOW (ref 4.22–5.81)
RDW: 13.4 % (ref 11.5–15.5)
WBC Count: 7.3 10*3/uL (ref 4.0–10.5)
nRBC: 0 % (ref 0.0–0.2)

## 2019-12-09 LAB — CMP (CANCER CENTER ONLY)
ALT: 13 U/L (ref 0–44)
AST: 15 U/L (ref 15–41)
Albumin: 3.5 g/dL (ref 3.5–5.0)
Alkaline Phosphatase: 48 U/L (ref 38–126)
Anion gap: 10 (ref 5–15)
BUN: 22 mg/dL (ref 8–23)
CO2: 27 mmol/L (ref 22–32)
Calcium: 9.2 mg/dL (ref 8.9–10.3)
Chloride: 104 mmol/L (ref 98–111)
Creatinine: 0.87 mg/dL (ref 0.61–1.24)
GFR, Est AFR Am: >60 mL/min (ref >60)
GFR, Estimated: >60 mL/min (ref >60)
Glucose, Bld: 115 mg/dL — ABNORMAL HIGH (ref 70–99)
Potassium: 3.7 mmol/L (ref 3.5–5.1)
Sodium: 141 mmol/L (ref 135–145)
Total Bilirubin: 1 mg/dL (ref 0.3–1.2)
Total Protein: 5.8 g/dL — ABNORMAL LOW (ref 6.5–8.1)

## 2019-12-09 LAB — LACTATE DEHYDROGENASE: LDH: 160 U/L (ref 98–192)

## 2019-12-09 MED ORDER — SODIUM CHLORIDE 0.9% FLUSH
10.0000 mL | INTRAVENOUS | Status: DC | PRN
  Filled 2019-12-09: qty 10

## 2019-12-10 ENCOUNTER — Ambulatory Visit
Admission: RE | Admit: 2019-12-10 | Discharge: 2019-12-10 | Disposition: A | Discharge status: Home / Self Care | Payer: Medicare HMO | Source: Ambulatory Visit | Attending: Radiation Oncology | Admitting: Radiation Oncology

## 2019-12-10 ENCOUNTER — Other Ambulatory Visit: Payer: Self-pay

## 2019-12-10 DIAGNOSIS — C8331 Diffuse large B-cell lymphoma, lymph nodes of head, face, and neck: Secondary | ICD-10-CM | POA: Diagnosis not present

## 2019-12-10 DIAGNOSIS — Z510 Encounter for antineoplastic radiation therapy: Secondary | ICD-10-CM | POA: Diagnosis not present

## 2019-12-11 ENCOUNTER — Ambulatory Visit
Admission: RE | Admit: 2019-12-11 | Discharge: 2019-12-11 | Disposition: A | Discharge status: Home / Self Care | Payer: Medicare HMO | Source: Ambulatory Visit | Attending: Radiation Oncology | Admitting: Radiation Oncology

## 2019-12-11 ENCOUNTER — Other Ambulatory Visit: Payer: Self-pay

## 2019-12-11 DIAGNOSIS — C8331 Diffuse large B-cell lymphoma, lymph nodes of head, face, and neck: Secondary | ICD-10-CM | POA: Diagnosis not present

## 2019-12-11 DIAGNOSIS — Z510 Encounter for antineoplastic radiation therapy: Secondary | ICD-10-CM | POA: Diagnosis not present

## 2019-12-14 ENCOUNTER — Other Ambulatory Visit: Payer: Self-pay

## 2019-12-14 ENCOUNTER — Ambulatory Visit: Payer: Medicare HMO

## 2019-12-14 ENCOUNTER — Other Ambulatory Visit (HOSPITAL_COMMUNITY): Payer: Medicare HMO

## 2019-12-14 ENCOUNTER — Ambulatory Visit
Admission: RE | Admit: 2019-12-14 | Discharge: 2019-12-14 | Disposition: A | Discharge status: Home / Self Care | Payer: Medicare HMO | Source: Ambulatory Visit | Attending: Radiation Oncology | Admitting: Radiation Oncology

## 2019-12-14 DIAGNOSIS — Z510 Encounter for antineoplastic radiation therapy: Secondary | ICD-10-CM | POA: Diagnosis not present

## 2019-12-14 DIAGNOSIS — C8331 Diffuse large B-cell lymphoma, lymph nodes of head, face, and neck: Secondary | ICD-10-CM | POA: Diagnosis not present

## 2019-12-15 ENCOUNTER — Ambulatory Visit
Admission: RE | Admit: 2019-12-15 | Discharge: 2019-12-15 | Disposition: A | Discharge status: Home / Self Care | Payer: Medicare HMO | Source: Ambulatory Visit | Attending: Radiation Oncology | Admitting: Radiation Oncology

## 2019-12-15 ENCOUNTER — Other Ambulatory Visit (HOSPITAL_COMMUNITY)
Admission: RE | Admit: 2019-12-15 | Discharge: 2019-12-15 | Disposition: A | Discharge status: Home / Self Care | Payer: Medicare HMO | Source: Ambulatory Visit | Attending: Cardiovascular Disease | Admitting: Cardiovascular Disease

## 2019-12-15 ENCOUNTER — Other Ambulatory Visit: Payer: Self-pay

## 2019-12-15 DIAGNOSIS — Z01812 Encounter for preprocedural laboratory examination: Secondary | ICD-10-CM | POA: Diagnosis not present

## 2019-12-15 DIAGNOSIS — C8331 Diffuse large B-cell lymphoma, lymph nodes of head, face, and neck: Secondary | ICD-10-CM | POA: Diagnosis not present

## 2019-12-15 DIAGNOSIS — Z20822 Contact with and (suspected) exposure to covid-19: Secondary | ICD-10-CM | POA: Diagnosis not present

## 2019-12-15 DIAGNOSIS — Z510 Encounter for antineoplastic radiation therapy: Secondary | ICD-10-CM | POA: Diagnosis not present

## 2019-12-15 LAB — SARS CORONAVIRUS 2 (TAT 6-24 HRS): SARS Coronavirus 2: NEGATIVE

## 2019-12-15 NOTE — Progress Notes (Signed)
Pre call done for cardioversion tomorrow 12/16/19. Realized patient has not been tested for COVID since 12/03/19. Informed patient to get tested at the [facility] and that he would need to go before 330pm today. Patient agreeable, I told him if he had any issues to call afib clinic and get rescheduled.

## 2019-12-16 ENCOUNTER — Encounter (HOSPITAL_COMMUNITY)
Admission: RE | Disposition: A | Discharge status: Home / Self Care | Payer: Medicare HMO | Source: Home / Self Care | Attending: Cardiovascular Disease

## 2019-12-16 ENCOUNTER — Ambulatory Visit (HOSPITAL_COMMUNITY)
Admission: RE | Admit: 2019-12-16 | Discharge: 2019-12-16 | Disposition: A | Discharge status: Home / Self Care | Payer: Medicare HMO | Attending: Cardiovascular Disease | Admitting: Cardiovascular Disease

## 2019-12-16 ENCOUNTER — Other Ambulatory Visit: Payer: Self-pay

## 2019-12-16 ENCOUNTER — Ambulatory Visit (HOSPITAL_COMMUNITY): Payer: Medicare HMO | Admitting: Anesthesiology

## 2019-12-16 ENCOUNTER — Ambulatory Visit
Admission: RE | Admit: 2019-12-16 | Discharge: 2019-12-16 | Disposition: A | Discharge status: Home / Self Care | Payer: Medicare HMO | Source: Ambulatory Visit | Attending: Radiation Oncology | Admitting: Radiation Oncology

## 2019-12-16 ENCOUNTER — Encounter (HOSPITAL_COMMUNITY): Payer: Self-pay | Admitting: Cardiovascular Disease

## 2019-12-16 DIAGNOSIS — Z79899 Other long term (current) drug therapy: Secondary | ICD-10-CM | POA: Diagnosis not present

## 2019-12-16 DIAGNOSIS — Z87891 Personal history of nicotine dependence: Secondary | ICD-10-CM | POA: Diagnosis not present

## 2019-12-16 DIAGNOSIS — I4819 Other persistent atrial fibrillation: Secondary | ICD-10-CM

## 2019-12-16 DIAGNOSIS — I4891 Unspecified atrial fibrillation: Secondary | ICD-10-CM | POA: Insufficient documentation

## 2019-12-16 DIAGNOSIS — E785 Hyperlipidemia, unspecified: Secondary | ICD-10-CM | POA: Diagnosis not present

## 2019-12-16 DIAGNOSIS — I10 Essential (primary) hypertension: Secondary | ICD-10-CM | POA: Diagnosis not present

## 2019-12-16 DIAGNOSIS — J45909 Unspecified asthma, uncomplicated: Secondary | ICD-10-CM | POA: Diagnosis not present

## 2019-12-16 DIAGNOSIS — Z882 Allergy status to sulfonamides status: Secondary | ICD-10-CM | POA: Insufficient documentation

## 2019-12-16 DIAGNOSIS — C8331 Diffuse large B-cell lymphoma, lymph nodes of head, face, and neck: Secondary | ICD-10-CM | POA: Diagnosis not present

## 2019-12-16 DIAGNOSIS — H548 Legal blindness, as defined in USA: Secondary | ICD-10-CM | POA: Diagnosis not present

## 2019-12-16 DIAGNOSIS — Z923 Personal history of irradiation: Secondary | ICD-10-CM | POA: Diagnosis not present

## 2019-12-16 DIAGNOSIS — Z7901 Long term (current) use of anticoagulants: Secondary | ICD-10-CM | POA: Insufficient documentation

## 2019-12-16 DIAGNOSIS — Z9221 Personal history of antineoplastic chemotherapy: Secondary | ICD-10-CM | POA: Insufficient documentation

## 2019-12-16 DIAGNOSIS — Z510 Encounter for antineoplastic radiation therapy: Secondary | ICD-10-CM | POA: Diagnosis not present

## 2019-12-16 HISTORY — PX: CARDIOVERSION: SHX1299

## 2019-12-16 HISTORY — DX: Other complications of anesthesia, initial encounter: T88.59XA

## 2019-12-16 SURGERY — CARDIOVERSION
Anesthesia: General

## 2019-12-16 MED ORDER — SODIUM CHLORIDE 0.9 % IV SOLN: INTRAVENOUS | Status: DC

## 2019-12-16 MED ORDER — LIDOCAINE HCL (CARDIAC) PF 100 MG/5ML IV SOSY
PREFILLED_SYRINGE | INTRAVENOUS | Status: DC | PRN
  Administered 2019-12-16: 40 mg via INTRAVENOUS

## 2019-12-16 MED ORDER — PROPOFOL 10 MG/ML IV BOLUS
INTRAVENOUS | Status: DC | PRN
  Administered 2019-12-16 (×2): 40 mg via INTRAVENOUS

## 2019-12-16 NOTE — Interval H&P Note (Signed)
History and Physical Interval Note:  12/16/2019 12:48 PM  Charles Li  has presented today for surgery, with the diagnosis of AFIB.  The various methods of treatment have been discussed with the patient and family. After consideration of risks, benefits and other options for treatment, the patient has consented to  Procedure(s): CARDIOVERSION (N/A) as a surgical intervention.  The patient's history has been reviewed, patient examined, no change in status, stable for surgery.  I have reviewed the patient's chart and labs.  Questions were answered to the patient's satisfaction.     [name], MD

## 2019-12-16 NOTE — Discharge Instructions (Signed)
Electrical Cardioversion Electrical cardioversion is the delivery of a jolt of electricity to restore a normal rhythm to the heart. A rhythm that is too fast or is not regular keeps the heart from pumping well. In this procedure, sticky patches or metal paddles are placed on the chest to deliver electricity to the heart from a device. This procedure may be done in an emergency if:  There is low or no blood pressure as a result of the heart rhythm.  Normal rhythm must be restored as fast as possible to protect the brain and heart from further damage.  It may save a life. This may also be a scheduled procedure for irregular or fast heart rhythms that are not immediately life-threatening. Tell a health care provider about:  Any allergies you have.  All medicines you are taking, including vitamins, herbs, eye drops, creams, and over-the-counter medicines.  Any problems you or family members have had with anesthetic medicines.  Any blood disorders you have.  Any surgeries you have had.  Any medical conditions you have.  Whether you are pregnant or may be pregnant. What are the risks? Generally, this is a safe procedure. However, problems may occur, including:  Allergic reactions to medicines.  A blood clot that breaks free and travels to other parts of your body.  The possible return of an abnormal heart rhythm within hours or days after the procedure.  Your heart stopping (cardiac arrest). This is rare. What happens before the procedure? Medicines  Your health care provider may have you start taking: ? Blood-thinning medicines (anticoagulants) so your blood does not clot as easily. ? Medicines to help stabilize your heart rate and rhythm.  Ask your health care provider about: ? Changing or stopping your regular medicines. This is especially important if you are taking diabetes medicines or blood thinners. ? Taking medicines such as aspirin and ibuprofen. These medicines can  thin your blood. Do not take these medicines unless your health care provider tells you to take them. ? Taking over-the-counter medicines, vitamins, herbs, and supplements. General instructions  Follow instructions from your health care provider about eating or drinking restrictions.  Plan to have someone take you home from the hospital or clinic.  If you will be going home right after the procedure, plan to have someone with you for 24 hours.  Ask your health care provider what steps will be taken to help prevent infection. These may include washing your skin with a germ-killing soap. What happens during the procedure?   An IV will be inserted into one of your veins.  Sticky patches (electrodes) or metal paddles may be placed on your chest.  You will be given a medicine to help you relax (sedative).  An electrical shock will be delivered. The procedure may vary among health care providers and hospitals. What can I expect after the procedure?  Your blood pressure, heart rate, breathing rate, and blood oxygen level will be monitored until you leave the hospital or clinic.  Your heart rhythm will be watched to make sure it does not change.  You may have some redness on the skin where the shocks were given. Follow these instructions at home:  Do not drive for 24 hours if you were given a sedative during your procedure.  Take over-the-counter and prescription medicines only as told by your health care provider.  Ask your health care provider how to check your pulse. Check it often.  Rest for 48 hours after the procedure or   as told by your health care provider.  Avoid or limit your caffeine use as told by your health care provider.  Keep all follow-up visits as told by your health care provider. This is important. Contact a health care provider if:  You feel like your heart is beating too quickly or your pulse is not regular.  You have a serious muscle cramp that does not go  away. Get help right away if:  You have discomfort in your chest.  You are dizzy or you feel faint.  You have trouble breathing or you are short of breath.  Your speech is slurred.  You have trouble moving an arm or leg on one side of your body.  Your fingers or toes turn cold or blue. Summary  Electrical cardioversion is the delivery of a jolt of electricity to restore a normal rhythm to the heart.  This procedure may be done right away in an emergency or may be a scheduled procedure if the condition is not an emergency.  Generally, this is a safe procedure.  After the procedure, check your pulse often as told by your health care provider. This information is not intended to replace advice given to you by your health care provider. Make sure you discuss any questions you have with your health care provider. Document Revised: 01/26/2019 Document Reviewed: 01/26/2019 Elsevier Patient Education  2020 [employer]  

## 2019-12-16 NOTE — Transfer of Care (Signed)
Immediate Anesthesia Transfer of Care Note  Patient: Charles Li  Procedure(s) Performed: CARDIOVERSION (N/A )  Patient Location: Endoscopy Unit  Anesthesia Type:MAC  Level of Consciousness: awake  Airway & Oxygen Therapy: Patient Spontanous Breathing  Post-op Assessment: Report given to RN and Post -op Vital signs reviewed and stable  Post vital signs: Reviewed and stable  Last Vitals:  Vitals Value Taken Time  BP    Temp    Pulse    Resp    SpO2      Last Pain:  Vitals:   12/16/19 1217  TempSrc: Oral  PainSc: 0-No pain         Complications: No apparent anesthesia complications

## 2019-12-16 NOTE — Anesthesia Postprocedure Evaluation (Signed)
Anesthesia Post Note  Patient: Charles Li  Procedure(s) Performed: CARDIOVERSION (N/A )     Patient location during evaluation: Endoscopy Anesthesia Type: General Level of consciousness: awake and alert Pain management: pain level controlled Vital Signs Assessment: post-procedure vital signs reviewed and stable Respiratory status: spontaneous breathing, nonlabored ventilation and respiratory function stable Cardiovascular status: blood pressure returned to baseline and stable Postop Assessment: no apparent nausea or vomiting Anesthetic complications: no    Last Vitals:  Vitals:   12/16/19 1303 12/16/19 1314  BP: (!) 129/100 122/64  Pulse: (!) 51 (!) 50  Resp: 12 (!) 9  Temp:    SpO2: 100% 95%    Last Pain:  Vitals:   12/16/19 1314  TempSrc:   PainSc: 0-No pain                 Jerald Hennington,W. EDMOND

## 2019-12-16 NOTE — Anesthesia Preprocedure Evaluation (Addendum)
Anesthesia Evaluation  Patient identified by MRN, date of birth, ID band Patient awake    Reviewed: Allergy & Precautions, H&P , NPO status , Patient's Chart, lab work & pertinent test results  History of Anesthesia Complications (+) DIFFICULT AIRWAY  Airway Mallampati: II  TM Distance: >3 FB Neck ROM: Full    Dental no notable dental hx. (+) Teeth Intact, Dental Advisory Given   Pulmonary neg pulmonary ROS, former smoker,    Pulmonary exam normal breath sounds clear to auscultation       Cardiovascular hypertension, Pt. on medications + dysrhythmias Atrial Fibrillation  Rhythm:Irregular Rate:Normal     Neuro/Psych negative neurological ROS  negative psych ROS   GI/Hepatic negative GI ROS, Neg liver ROS,   Endo/Other  negative endocrine ROS  Renal/GU negative Renal ROS  negative genitourinary   Musculoskeletal   Abdominal   Peds  Hematology negative hematology ROS (+)   Anesthesia Other Findings   Reproductive/Obstetrics negative OB ROS                            Anesthesia Physical Anesthesia Plan  ASA: III  Anesthesia Plan: General   Post-op Pain Management:    Induction: Intravenous  PONV Risk Score and Plan: 2 and Propofol infusion and Treatment may vary due to age or medical condition  Airway Management Planned: Mask  Additional Equipment:   Intra-op Plan:   Post-operative Plan:   Informed Consent: I have reviewed the patients History and Physical, chart, labs and discussed the procedure including the risks, benefits and alternatives for the proposed anesthesia with the patient or authorized representative who has indicated his/her understanding and acceptance.     Dental advisory given  Plan Discussed with: CRNA  Anesthesia Plan Comments:         Anesthesia Quick Evaluation

## 2019-12-16 NOTE — CV Procedure (Signed)
Electrical Cardioversion Procedure Note Charles Li [id number] [birth year]  Procedure: Electrical Cardioversion Indications:  Atrial Fibrillation  Procedure Details Consent: Risks of procedure as well as the alternatives and risks of each were explained to the (patient/caregiver).  Consent for procedure obtained. Time Out: Verified patient identification, verified procedure, site/side was marked, verified correct patient position, special equipment/implants available, medications/allergies/relevent history reviewed, required imaging and test results available.  Performed  Patient placed on cardiac monitor, pulse oximetry, supplemental oxygen as necessary.  Sedation given: propofol Pacer pads placed anterior and posterior chest.  Cardioverted 1 time(s).  Cardioverted at 150J.  Evaluation Findings: Post procedure EKG shows: sinus bradycardia Complications: None Patient did tolerate procedure well.   Charles Latch, MD 12/16/2019, 12:48 PM

## 2019-12-17 ENCOUNTER — Ambulatory Visit
Admission: RE | Admit: 2019-12-17 | Discharge: 2019-12-17 | Disposition: A | Discharge status: Home / Self Care | Payer: Medicare HMO | Source: Ambulatory Visit | Attending: Radiation Oncology | Admitting: Radiation Oncology

## 2019-12-17 ENCOUNTER — Other Ambulatory Visit: Payer: Self-pay

## 2019-12-17 ENCOUNTER — Encounter (HOSPITAL_COMMUNITY): Payer: Self-pay | Admitting: Cardiovascular Disease

## 2019-12-17 DIAGNOSIS — C8331 Diffuse large B-cell lymphoma, lymph nodes of head, face, and neck: Secondary | ICD-10-CM | POA: Diagnosis not present

## 2019-12-17 DIAGNOSIS — Z510 Encounter for antineoplastic radiation therapy: Secondary | ICD-10-CM | POA: Diagnosis not present

## 2019-12-18 ENCOUNTER — Other Ambulatory Visit: Payer: Self-pay

## 2019-12-18 ENCOUNTER — Ambulatory Visit
Admission: RE | Admit: 2019-12-18 | Discharge: 2019-12-18 | Disposition: A | Discharge status: Home / Self Care | Payer: Medicare HMO | Source: Ambulatory Visit | Attending: Radiation Oncology | Admitting: Radiation Oncology

## 2019-12-18 DIAGNOSIS — C8331 Diffuse large B-cell lymphoma, lymph nodes of head, face, and neck: Secondary | ICD-10-CM | POA: Diagnosis not present

## 2019-12-18 DIAGNOSIS — Z510 Encounter for antineoplastic radiation therapy: Secondary | ICD-10-CM | POA: Diagnosis not present

## 2019-12-21 ENCOUNTER — Other Ambulatory Visit: Payer: Self-pay

## 2019-12-21 ENCOUNTER — Ambulatory Visit
Admission: RE | Admit: 2019-12-21 | Discharge: 2019-12-21 | Disposition: A | Discharge status: Home / Self Care | Payer: Medicare HMO | Source: Ambulatory Visit | Attending: Radiation Oncology | Admitting: Radiation Oncology

## 2019-12-21 DIAGNOSIS — Z510 Encounter for antineoplastic radiation therapy: Secondary | ICD-10-CM | POA: Diagnosis not present

## 2019-12-21 DIAGNOSIS — C8331 Diffuse large B-cell lymphoma, lymph nodes of head, face, and neck: Secondary | ICD-10-CM | POA: Diagnosis not present

## 2019-12-22 ENCOUNTER — Other Ambulatory Visit: Payer: Self-pay

## 2019-12-22 ENCOUNTER — Ambulatory Visit
Admission: RE | Admit: 2019-12-22 | Discharge: 2019-12-22 | Disposition: A | Discharge status: Home / Self Care | Payer: Medicare HMO | Source: Ambulatory Visit | Attending: Radiation Oncology | Admitting: Radiation Oncology

## 2019-12-22 DIAGNOSIS — C8331 Diffuse large B-cell lymphoma, lymph nodes of head, face, and neck: Secondary | ICD-10-CM | POA: Diagnosis not present

## 2019-12-22 DIAGNOSIS — Z510 Encounter for antineoplastic radiation therapy: Secondary | ICD-10-CM | POA: Diagnosis not present

## 2019-12-23 ENCOUNTER — Other Ambulatory Visit: Payer: Self-pay

## 2019-12-23 ENCOUNTER — Ambulatory Visit
Admission: RE | Admit: 2019-12-23 | Discharge: 2019-12-23 | Disposition: A | Discharge status: Home / Self Care | Payer: Medicare HMO | Source: Ambulatory Visit | Attending: Radiation Oncology | Admitting: Radiation Oncology

## 2019-12-23 ENCOUNTER — Encounter: Payer: Self-pay | Admitting: Radiation Oncology

## 2019-12-23 DIAGNOSIS — Z510 Encounter for antineoplastic radiation therapy: Secondary | ICD-10-CM | POA: Diagnosis not present

## 2019-12-23 DIAGNOSIS — C8331 Diffuse large B-cell lymphoma, lymph nodes of head, face, and neck: Secondary | ICD-10-CM | POA: Diagnosis not present

## 2019-12-23 NOTE — Progress Notes (Signed)
Oncology Nurse Navigator Documentation  Met with Charles Li and his wife after final RT to offer support and to celebrate end of radiation treatment.   Provided verbal/written post-RT guidance:  Importance of keeping all follow-up appts with Dr. Lorenso Courier and Dr. Isidore Moos.  Importance of protecting treatment area from sun.  Continuation of Sonafine application 2-3 times daily, application of antibiotic ointment to areas of raw skin; when supply of Sonafine exhausted transition to OTC lotion with vitamin E. Provided/reviewed Epic calendar of upcoming appts. Explained my role as navigator will continue for several more months, encouraged him to call me with needs/concerns.     Harlow Asa RN, BSN, OCN Head & Neck Oncology Nurse [place] [facility] Phone # [phone]  Fax # [phone]

## 2019-12-24 ENCOUNTER — Other Ambulatory Visit: Payer: Self-pay

## 2019-12-24 ENCOUNTER — Encounter (HOSPITAL_COMMUNITY): Payer: Self-pay | Admitting: Nurse Practitioner

## 2019-12-24 ENCOUNTER — Ambulatory Visit (HOSPITAL_COMMUNITY)
Admission: RE | Admit: 2019-12-24 | Discharge: 2019-12-24 | Disposition: A | Discharge status: Home / Self Care | Payer: Medicare HMO | Source: Ambulatory Visit | Attending: Nurse Practitioner | Admitting: Nurse Practitioner

## 2019-12-24 VITALS — BP 136/76 | HR 52 | Ht 70.0 in | Wt 180.2 lb

## 2019-12-24 DIAGNOSIS — Z79899 Other long term (current) drug therapy: Secondary | ICD-10-CM | POA: Insufficient documentation

## 2019-12-24 DIAGNOSIS — I4819 Other persistent atrial fibrillation: Secondary | ICD-10-CM | POA: Diagnosis not present

## 2019-12-24 DIAGNOSIS — Z888 Allergy status to other drugs, medicaments and biological substances status: Secondary | ICD-10-CM | POA: Diagnosis not present

## 2019-12-24 DIAGNOSIS — C8591 Non-Hodgkin lymphoma, unspecified, lymph nodes of head, face, and neck: Secondary | ICD-10-CM | POA: Diagnosis not present

## 2019-12-24 DIAGNOSIS — Z882 Allergy status to sulfonamides status: Secondary | ICD-10-CM | POA: Insufficient documentation

## 2019-12-24 DIAGNOSIS — Z7901 Long term (current) use of anticoagulants: Secondary | ICD-10-CM | POA: Diagnosis not present

## 2019-12-24 DIAGNOSIS — Z9221 Personal history of antineoplastic chemotherapy: Secondary | ICD-10-CM | POA: Insufficient documentation

## 2019-12-24 DIAGNOSIS — H548 Legal blindness, as defined in USA: Secondary | ICD-10-CM | POA: Insufficient documentation

## 2019-12-24 DIAGNOSIS — Z8249 Family history of ischemic heart disease and other diseases of the circulatory system: Secondary | ICD-10-CM | POA: Diagnosis not present

## 2019-12-24 DIAGNOSIS — Z809 Family history of malignant neoplasm, unspecified: Secondary | ICD-10-CM | POA: Diagnosis not present

## 2019-12-24 DIAGNOSIS — I4891 Unspecified atrial fibrillation: Secondary | ICD-10-CM | POA: Diagnosis not present

## 2019-12-24 DIAGNOSIS — E785 Hyperlipidemia, unspecified: Secondary | ICD-10-CM | POA: Diagnosis not present

## 2019-12-24 DIAGNOSIS — I10 Essential (primary) hypertension: Secondary | ICD-10-CM | POA: Diagnosis not present

## 2019-12-24 DIAGNOSIS — D6869 Other thrombophilia: Secondary | ICD-10-CM | POA: Diagnosis not present

## 2019-12-24 DIAGNOSIS — Z880 Allergy status to penicillin: Secondary | ICD-10-CM | POA: Insufficient documentation

## 2019-12-24 DIAGNOSIS — Z87891 Personal history of nicotine dependence: Secondary | ICD-10-CM | POA: Diagnosis not present

## 2019-12-24 MED ORDER — AMLODIPINE BESYLATE 5 MG PO TABS: 2.5000 mg | ORAL_TABLET | Freq: Every day | ORAL | Status: DC

## 2019-12-24 MED ORDER — AMLODIPINE BESYLATE 2.5 MG PO TABS: 2.5000 mg | ORAL_TABLET | Freq: Every day | ORAL | 1 refills | Status: DC

## 2019-12-24 MED ORDER — BENAZEPRIL-HYDROCHLOROTHIAZIDE 20-12.5 MG PO TABS
1.0000 | ORAL_TABLET | Freq: Every day | ORAL | 2 refills | Status: DC
Start: 2019-12-24 — End: 2020-02-25

## 2019-12-24 NOTE — Progress Notes (Signed)
Primary Care Physician: Eulas Post, MD Referring Physician: Dr. Rayann Heman Cardiologist: Dr. Scarlette Calico is a 79 y.o. male with a h/o afib, lymphoma of neck, legal blindness. Per pt, afib  was found in February of this year, at time of echo. He was unaware and had  slow ventricular response without rate control drugs. He was referred to [place] by oncologist for further evaluation, who referred to Dr. Rayann Heman. The patient at the time of Dr. Jackalyn Lombard visit did not want to start anticoagulation while undergoing chemo. Dr. Rayann Heman did not think PPM was indicated at the time.   He has now finished chemo x 4 weeks and is willing to follow Dr. Jackalyn Lombard plan now of starting anticoagulation and subsequent DCCV after adequate time on drug. Pt has started radiation of his neck sine I saw him last.  He had one dip in plts in March to 72, CBC one month ago plts were in normal range. Per oncologist, ok to start anticoagulation if plts are over 50. Repeat CBC today with start of eliquis showed adequate plt's.  Pt is back in afib 5/27 clinic, 5/27 as he has noted some swelling of his LE's and questioned if it was related to ELiquis use, since this has been the only change in meds. His weight up a few pounds and has swelling of the lower leg's  that does not go down at night.  No abdominal fullness. I discussed that I did not think eliquis was responsible for this unless his blood count had dropped. It may be the afib as well. He has been on the eliquis for 2 weeks now and will set up cardioversion for 6/7. His HR in afib is in the upper 40's. This is his usual in afib. He is not symptomatic with this. In SR, pt reports his HR was in the low 50's.   F/u in afib clinic, 6/17, pt  had a successful cardioversion and continues in afib. He feels improved. Since I decreased his amlodipine and increased HCTZ, his LLE has resolved. He is now wanting to go back on his usual dose of HCTZ. .   Today,  he denies symptoms of palpitations, chest pain, shortness of breath, orthopnea, PND, lower extremity edema, dizziness, presyncope, syncope, or neurologic sequela. The patient is tolerating medications without difficulties and is otherwise without complaint today.   Past Medical History:  Diagnosis Date  . Abdominal pain, epigastric 07/14/2009  . Abdominal wall hernia   . ALLERGIC RHINITIS 12/12/2006  . ASTHMA 11/28/2007  . 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 Complication of anesthesia    Anxiety with gas  . Diffuse large B cell lymphoma ([place])   . GLAUCOMA 12/12/2006  . 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 HERNIA, VENTRAL 01/04/2009  . HYPERLIPIDEMIA 11/28/2007  . HYPERTENSION 12/12/2006  . Leg swelling 04-24-12   occ., none at present  . Persistent atrial fibrillation ([place])   . RENAL CALCULUS 03/24/2010  . VENOUS INSUFFICIENCY 01/18/2010  . Visual disturbance 04-24-12   legally blind   Past Surgical History:  Procedure Laterality Date  . CARDIOVERSION N/A 12/16/2019   Procedure: CARDIOVERSION;  Surgeon: [name], MD;  Location: [facility];  Service: Cardiovascular;  Laterality: N/A;  . CATARACT EXTRACTION  2009 - approximate   bilat & glaucoma surgery (7 ophth surgeries)  . INGUINAL HERNIA REPAIR  05/02/2012   Procedure: HERNIA REPAIR INGUINAL ADULT BILATERAL;  Surgeon: Shann Medal, MD;  Location: [facility];  Service: General;  Laterality: N/A;  . IR IMAGING GUIDED PORT INSERTION  09/07/2019  . VASECTOMY  04-24-12  . VENTRAL HERNIA REPAIR  05/02/2012   Procedure: LAPAROSCOPIC VENTRAL HERNIA;  Surgeon: Shann Medal, MD;  Location: [facility];  Service: General;  Laterality: N/A;    Current Outpatient Medications  Medication Sig Dispense Refill  . amLODipine (NORVASC) 2.5 MG tablet Take 1 tablet (2.5 mg total) by mouth daily. 90 tablet 1  . apixaban (ELIQUIS) 5 MG TABS tablet Take 1 tablet (5 mg total) by mouth 2 (two) times daily. 180 tablet 3  . dorzolamide-timolol (COSOPT) 22.3-6.8 MG/ML  ophthalmic solution Place 1 drop into both eyes 2 (two) times daily.     . Multiple Vitamin (MULTIVITAMIN WITH MINERALS) TABS tablet Take 1 tablet by mouth daily.    . Netarsudil-Latanoprost (ROCKLATAN) 0.02-0.005 % SOLN Place 1 drop into both eyes at bedtime.     . pravastatin (PRAVACHOL) 40 MG tablet Take 1 tablet (40 mg total) by mouth daily. Please schedule an appointment for further refills. [phone] (Patient taking differently: Take 40 mg by mouth every evening. Please schedule an appointment for further refills. [phone]) 90 tablet 3  . benazepril-hydrochlorthiazide (LOTENSIN HCT) 20-12.5 MG tablet Take 1 tablet by mouth daily. 90 tablet 2   No current facility-administered medications for this encounter.    Allergies  Allergen Reactions  . Dorzolamide Anaphylaxis and Dermatitis    Irritation on upper lids   . Acetazolamide Diarrhea    Lost weight also Lost weight also   . Bimatoprost Dermatitis and Other (See Comments)    Unknown Unknown   . Brimonidine Other (See Comments)    Unknown Unknown   . Sulfa Antibiotics   . Sulfamethoxazole Hives  . Dorzolamide Hcl-Timolol Mal Rash    itching itching   . Erythromycin Rash  . Penicillins Rash  . Sulfonamide Derivatives Rash    Social History   Socioeconomic History  . Marital status: Married    Spouse name: Not on file  . Number of children: Not on file  . Years of education: Not on file  . Highest education level: Not on file  Occupational History  . Not on file  Tobacco Use  . Smoking status: Former Smoker    Packs/day: 1.50    Years: 14.00    Pack years: 21.00    Types: Cigarettes    Quit date: 11/06/1969    Years since quitting: 50.1  . Smokeless tobacco: Never Used  Vaping Use  . Vaping Use: Never used  Substance and Sexual Activity  . Alcohol use: Yes    Alcohol/week: 14.0 standard drinks    Types: 14 Glasses of wine per week    Comment: wine & beer - 3 glasses daily; Waits 10 days after  chemo treatment  . Drug use: No  . Sexual activity: Yes  Other Topics Concern  . Not on file  Social History Narrative   Lives in [place] with wife.      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 Difficulty of Paying Living Expenses:   Food Insecurity:   . Worried About Charity fundraiser in the Last Year:   . Arboriculturist in the Last Year:   Transportation Needs:   . Film/video editor (Medical):   Marland Kitchen Lack of Transportation (Non-Medical):   Physical Activity:   . Days of Exercise per Week:   .  Minutes of Exercise per Session:   Stress:   . Feeling of Stress :   Social Connections:   . Frequency of Communication with Friends and Family:   . Frequency of Social Gatherings with Friends and Family:   . Attends Religious Services:   . Active Member of Clubs or Organizations:   . Attends Archivist Meetings:   Marland Kitchen Marital Status:   Intimate Partner Violence:   . Fear of Current or Ex-Partner:   . Emotionally Abused:   Marland Kitchen Physically Abused:   . Sexually Abused:     Family History  Problem Relation Age of Onset  . Cancer Mother   . Pneumonia Father   . Heart disease Brother        congen heart disease  . Birth defects Paternal Aunt        lung  . Colon cancer Neg Hx   . Stomach cancer Neg Hx     ROS- All systems are reviewed and negative except as per the HPI above  Physical Exam: Vitals:   12/24/19 0924  BP: 136/76  Pulse: (!) 52  Weight: 81.7 kg  Height: 5\' 10"  (1.778 m)   Wt Readings from Last 3 Encounters:  12/24/19 81.7 kg  12/16/19 82.6 kg  12/09/19 83.8 kg    Labs: Lab Results  Component Value Date   NA 141 12/09/2019   K 3.7 12/09/2019   CL 104 12/09/2019   CO2 27 12/09/2019   GLUCOSE 115 (H) 12/09/2019   BUN 22 12/09/2019   CREATININE 0.87 12/09/2019   CALCIUM 9.2 12/09/2019   MG 1.8 11/23/2019   No results found for: INR Lab Results  Component Value  Date   CHOL 143 07/14/2018   HDL 63.80 07/14/2018   LDLCALC 64 07/14/2018   TRIG 73.0 07/14/2018     GEN- The patient is well appearing, alert and oriented x 3 today.   Head- normocephalic, atraumatic Eyes-  Sclera clear, conjunctiva pink Ears- hearing intact Oropharynx- clear Neck- supple, no JVP Lymph- no cervical lymphadenopathy Lungs- Clear to ausculation bilaterally, normal work of breathing Heart- regular rate and rhythm, no murmurs, rubs or gallops, PMI not laterally displaced GI- soft, NT, ND, + BS Extremities- no clubbing, cyanosis, or edema MS- no significant deformity or atrophy Skin- no rash or lesion Psych- euthymic mood, full affect Neuro- strength and sensation are intact  EKG-sinus brady at 52 bpm, pr int 264 ms, qrs int 94 ms, qtc 414 ms  Echo- 08/2019-  1. Pt in atrial fibrillation during the study; normal LV function; mild  LVH; mild biatrial enlargement; trace AI.  2. Left ventricular ejection fraction, by estimation, is 60 to 65%. The  left ventricle has normal function. The left ventricle has no regional  wall motion abnormalities. There is mild left ventricular hypertrophy.  Left ventricular diastolic function  could not be evaluated. Left ventricular diastolic function could not be  evaluated.  3. Right ventricular systolic function is normal. The right ventricular  size is normal. There is normal pulmonary artery systolic pressure.  4. Left atrial size was mildly dilated.  5. Right atrial size was mildly dilated.  6. The mitral valve is normal in structure and function. Trivial mitral  valve regurgitation. No evidence of mitral stenosis.  7. The aortic valve is tricuspid. Aortic valve regurgitation is trivial.  Mild aortic valve sclerosis is present, with no evidence of aortic valve  stenosis.  8. The inferior vena cava is normal in size with  greater than 50%  respiratory variability, suggesting right atrial pressure of 3 mmHg.   FINDINGS   Left Ventricle: Left ventricular ejection fraction, by estimation, is 60  to 65%. The left ventricle has normal function. The left ventricle has no  regional wall motion abnormalities. The left ventricular internal cavity  size was normal in size. There is  mild left ventricular hypertrophy. Left ventricular diastolic function  could not be evaluated due to atrial fibrillation. Left ventricular  diastolic function could not be evaluated.   Right Ventricle: The right ventricular size is normal.Right ventricular  systolic function is normal. There is normal pulmonary artery systolic  pressure. The tricuspid regurgitant velocity is 2.40 m/s, and with an  assumed right atrial pressure of 3 mmHg,  the estimated right ventricular systolic pressure is 63.8 mmHg.   Assessment and Plan:  1. New onset asymptomatic afib with SVR,  Dx 08/2019 He has now had successful cardioversion and remains in SR today Continue  amlodipine 2.5 mg daily Return to benazepril 25 mg/hctz 12.5 mg daily  2. CHA2DS2VASc score of 3 Continue eliquis 5 mg bid   3. Lymphoma  Per oncology   afib clinic as needed   Geroge Baseman. Glendal Cassaday, [facility] [address] [place], [place] [address] [phone]

## 2019-12-29 ENCOUNTER — Other Ambulatory Visit: Payer: Self-pay | Admitting: Hematology and Oncology

## 2019-12-29 DIAGNOSIS — C8331 Diffuse large B-cell lymphoma, lymph nodes of head, face, and neck: Secondary | ICD-10-CM

## 2019-12-29 NOTE — Progress Notes (Deleted)
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 Lucia Gaskins for tonsillar mass present for a few weeks. 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 FISH studies pending.  2) 08/17/2019: establish care with Dr. Lorenso Courier   3) 08/28/2019: PET CT scan reveals activity in left lingual tonsil, level 2 lymph node, and level 3 lymph node. Consistent with Stage I 4) 09/04/2019: Bone marrow biopsy shows no evidence of lymphoma in the bone marrow.  5) 09/04/2019; TTE showed EF 60-65%, however patient found to have new atrial fibrillation.  6) 09/14/2019: started R-CEOP chemotherapy, Cycle 1 Day 1 7) 10/05/2019: R-CEOP chemotherapy, Cycle 2 Day 1 8) 10/26/2019: R-CEOP chemotherapy Cycle 3 Day 1 9) 11/30/2019: start Radiation therapy with Dr. Isidore Moos  HISTORY OF PRESENTING ILLNESS:  DOLPH TAVANO 79 y.o. male with medical history significant for left tonsillar B cell lymphoma presents for f/u. He was last seen on 12/09/2019. In the interim he has completed radiation therapy.   On exam today Mr. Penna notes ***  Overall Mr. Trinkle tolerated his radiation therapy well.  ***  MEDICAL HISTORY:  Past Medical History:  Diagnosis Date  . Abdominal pain, epigastric 07/14/2009  . Abdominal wall hernia   . ALLERGIC RHINITIS 12/12/2006  . ASTHMA 11/28/2007  . 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 Complication of anesthesia    Anxiety with gas  . Diffuse  large B cell lymphoma ([place])   . GLAUCOMA 12/12/2006  . 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 HERNIA, VENTRAL 01/04/2009  . HYPERLIPIDEMIA 11/28/2007  . HYPERTENSION 12/12/2006  . Leg swelling 04-24-12   occ., none at present  . Persistent atrial fibrillation ([place])   . RENAL CALCULUS 03/24/2010  . VENOUS INSUFFICIENCY 01/18/2010  . Visual disturbance 04-24-12   legally blind    ALLERGIES:  is allergic to dorzolamide, acetazolamide, bimatoprost, brimonidine, sulfa antibiotics, sulfamethoxazole, dorzolamide hcl-timolol mal, erythromycin, penicillins, and sulfonamide derivatives.  MEDICATIONS:  Current Outpatient Medications  Medication Sig Dispense Refill  . amLODipine (NORVASC) 2.5 MG tablet Take 1 tablet (2.5 mg total) by mouth daily. 90 tablet 1  . apixaban (ELIQUIS) 5 MG TABS tablet Take 1 tablet (5 mg total) by mouth 2 (two) times daily. 180 tablet 3  . benazepril-hydrochlorthiazide (LOTENSIN HCT) 20-12.5 MG tablet Take 1 tablet by mouth daily. 90 tablet 2  . dorzolamide-timolol (COSOPT) 22.3-6.8 MG/ML ophthalmic solution Place 1 drop into both eyes 2 (two) times daily.     . Multiple Vitamin (MULTIVITAMIN WITH MINERALS) TABS tablet Take 1 tablet by mouth daily.    . Netarsudil-Latanoprost (ROCKLATAN) 0.02-0.005 % SOLN Place 1 drop into both eyes at bedtime.     . pravastatin (PRAVACHOL) 40 MG tablet Take 1 tablet (40 mg total) by mouth daily. Please schedule an appointment for further refills. [phone] (Patient taking differently: Take 40 mg by mouth every evening. Please schedule an appointment for further refills. [phone]) 90 tablet 3   No current facility-administered medications for this visit.    REVIEW OF SYSTEMS:   Constitutional: ( - ) fevers, ( - )  chills , ( - ) night sweats (+) hair loss Eyes: ( - ) blurriness of vision, ( - ) double vision, ( - ) watery eyes Ears, nose, mouth, throat, and face: ( - ) mucositis, ( + ) sore throat Respiratory: ( - ) cough, ( - )  dyspnea, ( - ) wheezes Cardiovascular: ( - ) palpitation, ( - ) chest discomfort, ( - ) lower extremity swelling Gastrointestinal:  ( - ) nausea, ( - ) heartburn, ( - ) change in bowel habits Skin: ( - ) abnormal skin rashes Lymphatics: ( - ) new lymphadenopathy, ( - ) easy bruising Neurological: ( - ) numbness, ( - ) tingling, ( - ) new weaknesses Behavioral/Psych: ( - ) mood change, ( - ) new changes  All other systems were reviewed with the patient and are negative.  PHYSICAL EXAMINATION: ECOG PERFORMANCE STATUS: 1 - Symptomatic but completely ambulatory  There were no vitals filed for this visit. There were no vitals filed for this visit.  GENERAL: well appearing elderly Caucasian male in NAD, balding with considerably thinner hair on his head  SKIN: skin color, texture, turgor are normal, no rashes or significant lesions OROPHARYNX: no swelling, erythema, enlarged tonsils or new lesions.  EYES: conjunctiva are pink and non-injected, sclera clear LUNGS: clear to auscultation and percussion with normal breathing effort HEART: regular rate & rhythm and no murmurs and no lower extremity edema Musculoskeletal: no cyanosis of digits and no clubbing  PSYCH: alert & oriented x 3, fluent speech NEURO: no focal motor/sensory deficits  LABORATORY DATA:  I have reviewed the data as listed CBC Latest Ref Rng & Units 12/09/2019 12/03/2019 11/23/2019  WBC 4.0 - 10.5 K/uL 7.3 8.0 9.3  Hemoglobin 13.0 - 17.0 g/dL 12.3(L) 12.3(L) 12.3(L)  Hematocrit 39 - 52 % 35.6(L) 37.8(L) 36.1(L)  Platelets 150 - 400 K/uL 219 250 245    CMP Latest Ref Rng & Units 12/09/2019 12/03/2019 11/23/2019  Glucose 70 - 99 mg/dL 115(H) 133(H) 133(H)  BUN 8 - 23 mg/dL 22 14 16   Creatinine 0.61 - 1.24 mg/dL 0.87 0.90 0.86  Sodium 135 - 145 mmol/L 141 140 142  Potassium 3.5 - 5.1 mmol/L 3.7 4.8 3.6  Chloride 98 - 111 mmol/L 104 101 104  CO2 22 - 32 mmol/L 27 29 32  Calcium 8.9 - 10.3 mg/dL 9.2 9.3 9.3  Total Protein 6.5 -  8.1 g/dL 5.8(L) 5.9(L) 5.9(L)  Total Bilirubin 0.3 - 1.2 mg/dL 1.0 0.8 0.7  Alkaline Phos 38 - 126 U/L 48 45 62  AST 15 - 41 U/L 15 18 14(L)  ALT 0 - 44 U/L 13 14 13     PATHOLOGY:  SURGICAL PATHOLOGY  CASE: [id number]  PATIENT: Nayef Gadsby  Surgical Pathology Report   Clinical History: tonsil cancer (cm)   FINAL MICROSCOPIC DIAGNOSIS:   A. TONSIL, LEFT, BIOPSY:  - Large B-cell lymphoma  - See comment   COMMENT:  The tonsillar tissue has an ulcerated surface with underlying atypical  cellular proliferation. By immunohistochemistry, the neoplastic cells  are positive for CD20, CD10, BCL6 and BCL2 (weak) but negative for CD3,  CD56, CD5, Mum-1, cytokeratin AE1/3 and EBV by in situ hybridization.  The proliferative rate by Ki-67 is 70 to 80%. Overall, the features are  consistent with a large B-cell lymphoma. The differential diagnosis  includes diffuse large B-cell lymphoma and high-grade B-cell lymphoma  (double hit). FISH is pending and will be reported in an addendum.   Preliminary results of  this case were given to Dr. Lucia Gaskins on August 14, 2019.   Dr. Tresa Moore reviewed the case and agrees with the above diagnosis.   GROSS DESCRIPTION:   The specimen is received in formalin and consists of a 0.9 x 0.4 x 0.4  cm piece of tan soft tissue. The specimen is entirely submitted in 1  cassette. Craig Staggers 08/14/2019)   Final Diagnosis performed by Thressa Sheller, MD.  Electronically signed  08/14/2019  Technical and / or Professional components performed at [facility]. [facility], [place] [address], [place], [place] [address].  Immunohistochemistry Technical component (if applicable) was performed  at [facility]. [address], [place],  [place], [place] [address].  IMMUNOHISTOCHEMISTRY DISCLAIMER (if applicable):  Some of these immunohistochemical stains may have been developed and the  performance characteristics determine by [facility]. Some  may not have been cleared or approved by the U.S. Food and Drug  Administration. The FDA has determined that such clearance or approval  is not necessary. This test is used for clinical purposes. It should not  be regarded as investigational or for research. This laboratory is  certified under the [place]  (CLIA-88) as qualified to perform high complexity clinical laboratory  testing. The controls stained appropriately.  SURGICAL PATHOLOGY  ** THIS IS AN ADDENDUM REPORT **  CASE: [id number]  PATIENT: Vineeth Plitt  Bone Marrow Report  **Addendum **   Reason for Addendum #1: Cytogenetics results   Clinical History: lymphoma, DLBCL, left posterior iliac, (ADC)    DIAGNOSIS:   BONE MARROW, ASPIRATE, CLOT, CORE:  - Normocellular marrow with trilineage hematopoiesis  - No lymphoma identified   PERIPHERAL BLOOD:  - Morphologically unremarkable  - See complete blood cell count   MICROSCOPIC DESCRIPTION:   PERIPHERAL BLOOD SMEAR: The peripheral blood is morphologically  unremarkable.   BONE MARROW ASPIRATE: Spicular, cellular and adequate for evaluation  Erythroid precursors: Orderly maturation without overt dysplasia  Granulocytic precursors: Orderly maturation without overt dysplasia  Megakaryocytes: Qualitatively and quantitatively unremarkable  Lymphocytes/plasma cells: No lymphocytosis or plasmacytosis   TOUCH PREPARATIONS: Hypocellular with no additional findings ascompared  to the aspirate smears.   CLOT AND BIOPSY: Examination of the core biopsy and clot section reveals  a normocellular bone marrow (30-40%) with trilineage hematopoiesis.  Myeloid and erythroid elements are present in essentially normal  proportions. Megakaryocytes display a spectrum of maturation without  clustering. There are no granulomas identified. A single well-formed  lymphoid aggregate is noted on the clot section. By    immunohistochemistry, is composed of an admixture of B and T cells  (CD20, CD3) without aberrant B-cell expression of CD5 or CD10.   IRON STAIN: Iron stains are performed on a bone marrow aspirate or touch  imprint smear and section of clot. The controls stained appropriately.     Storage Iron: Present    Ring Sideroblasts: Not identified   ADDITIONAL DATA/TESTING: Cytogenetics is pending and will be reported  separately. Flow cytometry performed on the bone marrow biopsy did not  identify a monoclonal B-cell or phenotypically aberrant T-cell  population (See [id number]).   CELL COUNT DATA:   Bone Marrow count performed on 500 cells shows:  Blasts:  0%  Myeloid: 57%  Promyelocytes: 0%  Erythroid:   28%  Myelocytes:  10% Lymphocytes:  7%  Metamyelocytes:   2%  Plasma cells: 8%  Bands:  7%  Neutrophils:  33% M:E ratio:   2.03  Eosinophils:  5%  Basophils:   0%  Monocytes:   0%   Lab Data: CBC performed on 09/04/2019 shows:  WBC: 8.6 k/uL Neutrophils:  63%  Hgb: 14.2 g/dL Lymphocytes:  30%  HCT: 41.2 %  Monocytes:   3%  MCV: 94.7 fL  Eosinophils:  4%  RDW: 11.8 %  Basophils:   0%  PLT: 263 k/uL   GROSS DESCRIPTION:   A: Aspirate smear   B: Received in B-plus fixative are tissue fragments measuring 2.1 x 1.0  x 0.3 cm in aggregate. The specimen is submitted in total.   C: Received in B-plus fixative is a 2.0 x 0.2 cm core of bone which is  submitted in toto fine decalcification. [facility] 09/04/2019)    Final Diagnosis performed by Thressa Sheller, MD.  Electronically signed  09/08/2019  Technical component performed at [facility]  [address] [place], [place] [address].  Professional component performed at [employer]. [facility],  [place] [address] [place], [place] [address].  Immunohistochemistry Technical component (if applicable) was performed  at [facility]. [address] [place],  [place], [place] [address].  IMMUNOHISTOCHEMISTRY DISCLAIMER (if applicable):  Some of these immunohistochemical stains may have been developed and the  performance characteristics determine by [facility]. Some  may not have been cleared or approved by the U.S. Food and Drug  Administration. The FDA has determined that such clearance or approval  is not necessary. This test is used for clinical purposes. It should not  be regarded as investigational or for research. This laboratory is  certified under the [place]  (CLIA-88) as qualified to perform high complexity clinical laboratory  testing. The controls stained appropriately.   ADDENDUM:   ** Please note this testing was performed and interpreted by an outside  facility. This addendum is only being added to provide a summary of the  results for report completeness. Please see electronic medical record  for a copy of the full report. **   Karyotype: 47,?XY,?del(?9)?(?q13q22)?,?+?mar[?3]?/?46,?XY[?20]?  Interpretation: ABNORMAL MALE KARYOTYPE   Cytogenetic analysis shows an abnormal male karyotype. 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 The remaining twenty cells show a normal  karyotype.   Deletion of chromosome 9q occurs in both myeloid (more common) and  lymphoid disorders, including acute myeloid leukemia (AML) (more  commonly) and myelodysplastic syndrome (MDS) (only rarely). In MDS the  prognostic significance would be expected to be intermediate.  In AML  the prognosis is variable. Marker chromosomes and structural  chromosomal aberrations in the absence of autosomal monosomies have  minimal prognostic impact. Correlation with other clinical and  laboratory findings is indicated.    RADIOGRAPHIC STUDIES:  No results found.  ASSESSMENT & PLAN DEMARYIUS IMRAN 79 y.o.  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   In the interim since his last visit the patient has completed radiation therapy and has received 17 of 17 planned fractions.  The patient tolerated the treatment well *** with only some pain on swallowing. He does not currently have any other focal symptoms.  Review of Mr. Blass's PET CT scan from 11/17/2019 is reassuring with complete resolution of FDG avidity in the involved lymph nodes.   The treatment plan was for Rituximab 375 mg/m2, Cyclophosphamide 776m/m2, etoposide IV 524mm2 (followed  by 154m/m2 PO on Day 2 and 3), and Vincristine 1.4 mg/m2 all on Day 1. The patient will also received PO prednisone 1011mDay 1-5 of each 3 week cycle. Patient recieved ISRT with Dr. 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 Bone marrow biopsy confirmed no alternative sites of lymphoma.  -- started R-CEOP chemotherapy on 09/14/2019. This is therapy with curative intent. Etoposide was substituted for anthracycline therapy due to his atrial fibrillation found on TTE.  -- At this time his findings are consistent with a Stage I. Patient completed 3 cycles of R-CEOP with ISRT to site of disease. Interval PET scan on 11/17/2019 showed no active disease. Patient completed radiation therapy. --routine follow up imaging is not recommended per NCCN guidelines. This can be performed as clinically indicated.   --RTC ***  #Oropharynx Pain, recurrent --not currently requiring pain medication  --recommend viscous lidocaine (tried prior for his tumor pain ,though for his current pain it may be more effective).  --additionally can consider warm salt water rinses.  --continue to monitor  #Symptom Management --no longer requires anti-nausea medication. Never needed during  chemotherapy.  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 We were agreeable to deferring evaluation to a later time.  --kidney function is stable and he has no urinary/pain symptoms --***  #Atrial Fibrillation --currently following with cardiology, underwent cardioversion on 12/16/2019.  --no bleeding, bruising, or other issues on Eliquis.   No orders of the defined types were placed in this encounter.   All questions were answered. The patient knows to call the clinic with any problems, questions or concerns.  A total of more than 30 minutes were spent on this encounter and over half of that time was spent on counseling and coordination of care as outlined above.   JoLedell PeoplesMD Department of Hematology/Oncology [facility] [place]: [phone]: [phone]: [email or web address]  12/29/2019 9:04 PM

## 2019-12-30 ENCOUNTER — Inpatient Hospital Stay: Payer: Medicare HMO | Admitting: Hematology and Oncology

## 2019-12-30 ENCOUNTER — Inpatient Hospital Stay: Payer: Medicare HMO

## 2019-12-30 ENCOUNTER — Telehealth: Payer: Self-pay | Admitting: *Deleted

## 2019-12-30 NOTE — Telephone Encounter (Signed)
TCT  Patient regarding today's appt. Pt did not show for his appt.  Spoke with patient and he stated he did not know about his appt.  Advised that I would send a message to scheduling to have his appt re-scheduled. He voiced understanding.

## 2020-01-06 NOTE — Progress Notes (Signed)
Charles Li presents today for follow up of radiation completed on 12/23/2019 to his left tonsil  Pain issues, if any: Patient denies Using a feeding tube?: N/A Weight changes, if any:  Wt Readings from Last 3 Encounters:  01/08/20 178 lb 12.8 oz (81.1 kg)  01/07/20 179 lb 4.8 oz (81.3 kg)  12/24/19 180 lb 3.2 oz (81.7 kg)   Swallowing issues, if any: Patient denies. Still has lack of taste, so he looses interest in food very quickly after about one bite. Drinking ~2 bottles of Ensure supplement daily Smoking or chewing tobacco? None Using fluoride trays daily? N/A Last ENT visit was on: Not since diagnosis  Other notable issues, if any: Reports a rash that developed at the base of his neck, down about 2" of his back and around the left side of shoulder. Patient reports rash seem to be resolving now.   F/U with Dr. Narda Rutherford on 01/07/2020:  "--routine follow up imaging is not recommended per NCCN guidelines. This can be performed as clinically indicated.   --RTC in 3 months for continued post treatment surveillance --not currently requiring pain medication  --recommend viscous lidocaine (tried prior for his tumor pain ,though for his current pain it may be more effective).  --additionally can consider warm salt water rinses.  --continue to monitor --no longer requires anti-nausea medication. Never needed during chemotherapy. " Vitals:   01/08/20 1126  BP: (!) 173/81  Pulse: (!) 50  Resp: 20  Temp: 98 F (36.7 C)  SpO2: 99%

## 2020-01-07 ENCOUNTER — Inpatient Hospital Stay: Payer: Medicare HMO

## 2020-01-07 ENCOUNTER — Inpatient Hospital Stay: Payer: Medicare HMO | Attending: Hematology and Oncology

## 2020-01-07 ENCOUNTER — Encounter: Payer: Self-pay | Admitting: Hematology and Oncology

## 2020-01-07 ENCOUNTER — Other Ambulatory Visit: Payer: Self-pay | Admitting: Hematology and Oncology

## 2020-01-07 ENCOUNTER — Other Ambulatory Visit: Payer: Self-pay

## 2020-01-07 ENCOUNTER — Inpatient Hospital Stay (HOSPITAL_BASED_OUTPATIENT_CLINIC_OR_DEPARTMENT_OTHER): Payer: Medicare HMO | Admitting: Hematology and Oncology

## 2020-01-07 VITALS — BP 141/75 | HR 50 | Temp 98.1°F | Resp 16 | Ht 70.0 in | Wt 179.3 lb

## 2020-01-07 DIAGNOSIS — Z95828 Presence of other vascular implants and grafts: Secondary | ICD-10-CM | POA: Diagnosis not present

## 2020-01-07 DIAGNOSIS — C8331 Diffuse large B-cell lymphoma, lymph nodes of head, face, and neck: Secondary | ICD-10-CM

## 2020-01-07 DIAGNOSIS — Z79899 Other long term (current) drug therapy: Secondary | ICD-10-CM | POA: Insufficient documentation

## 2020-01-07 DIAGNOSIS — I482 Chronic atrial fibrillation, unspecified: Secondary | ICD-10-CM | POA: Diagnosis not present

## 2020-01-07 DIAGNOSIS — I10 Essential (primary) hypertension: Secondary | ICD-10-CM | POA: Insufficient documentation

## 2020-01-07 DIAGNOSIS — Z7901 Long term (current) use of anticoagulants: Secondary | ICD-10-CM | POA: Insufficient documentation

## 2020-01-07 DIAGNOSIS — N132 Hydronephrosis with renal and ureteral calculous obstruction: Secondary | ICD-10-CM

## 2020-01-07 DIAGNOSIS — I4891 Unspecified atrial fibrillation: Secondary | ICD-10-CM | POA: Diagnosis not present

## 2020-01-07 DIAGNOSIS — Z923 Personal history of irradiation: Secondary | ICD-10-CM | POA: Diagnosis not present

## 2020-01-07 LAB — CBC WITH DIFFERENTIAL (CANCER CENTER ONLY)
Abs Immature Granulocytes: 0.03 10*3/uL (ref 0.00–0.07)
Basophils Absolute: 0.1 10*3/uL (ref 0.0–0.1)
Basophils Relative: 1 %
Eosinophils Absolute: 0.2 10*3/uL (ref 0.0–0.5)
Eosinophils Relative: 2 %
HCT: 38.9 % — ABNORMAL LOW (ref 39.0–52.0)
Hemoglobin: 13.5 g/dL (ref 13.0–17.0)
Immature Granulocytes: 1 %
Lymphocytes Relative: 9 %
Lymphs Abs: 0.6 10*3/uL — ABNORMAL LOW (ref 0.7–4.0)
MCH: 33 pg (ref 26.0–34.0)
MCHC: 34.7 g/dL (ref 30.0–36.0)
MCV: 95.1 fL (ref 80.0–100.0)
Monocytes Absolute: 0.6 10*3/uL (ref 0.1–1.0)
Monocytes Relative: 9 %
Neutro Abs: 5.2 10*3/uL (ref 1.7–7.7)
Neutrophils Relative %: 78 %
Platelet Count: 182 10*3/uL (ref 150–400)
RBC: 4.09 MIL/uL — ABNORMAL LOW (ref 4.22–5.81)
RDW: 11.5 % (ref 11.5–15.5)
WBC Count: 6.5 10*3/uL (ref 4.0–10.5)
nRBC: 0 % (ref 0.0–0.2)

## 2020-01-07 LAB — CMP (CANCER CENTER ONLY)
ALT: 14 U/L (ref 0–44)
AST: 16 U/L (ref 15–41)
Albumin: 3.5 g/dL (ref 3.5–5.0)
Alkaline Phosphatase: 56 U/L (ref 38–126)
Anion gap: 8 (ref 5–15)
BUN: 13 mg/dL (ref 8–23)
CO2: 29 mmol/L (ref 22–32)
Calcium: 9.2 mg/dL (ref 8.9–10.3)
Chloride: 104 mmol/L (ref 98–111)
Creatinine: 0.87 mg/dL (ref 0.61–1.24)
GFR, Est AFR Am: >60 mL/min (ref >60)
GFR, Estimated: >60 mL/min (ref >60)
Glucose, Bld: 144 mg/dL — ABNORMAL HIGH (ref 70–99)
Potassium: 3.7 mmol/L (ref 3.5–5.1)
Sodium: 141 mmol/L (ref 135–145)
Total Bilirubin: 0.6 mg/dL (ref 0.3–1.2)
Total Protein: 6 g/dL — ABNORMAL LOW (ref 6.5–8.1)

## 2020-01-07 LAB — LACTATE DEHYDROGENASE: LDH: 138 U/L (ref 98–192)

## 2020-01-07 MED ORDER — HEPARIN SOD (PORK) LOCK FLUSH 100 UNIT/ML IV SOLN
500.0000 [IU] | Freq: Once | INTRAVENOUS | Status: AC | PRN
  Administered 2020-01-07: 500 [IU]
  Filled 2020-01-07: qty 5

## 2020-01-07 MED ORDER — SODIUM CHLORIDE 0.9% FLUSH
10.0000 mL | INTRAVENOUS | Status: DC | PRN
  Administered 2020-01-07: 10 mL
  Filled 2020-01-07: qty 10

## 2020-01-07 NOTE — Progress Notes (Signed)
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 Lucia Gaskins for tonsillar mass present for a few weeks. 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 FISH studies pending.  2) 08/17/2019: establish care with Dr. Lorenso Courier   3) 08/28/2019: PET CT scan reveals activity in left lingual tonsil, level 2 lymph node, and level 3 lymph node. Consistent with Stage I 4) 09/04/2019: Bone marrow biopsy shows no evidence of lymphoma in the bone marrow.  5) 09/04/2019; TTE showed EF 60-65%, however patient found to have new atrial fibrillation.  6) 09/14/2019: started R-CEOP chemotherapy, Cycle 1 Day 1 7) 10/05/2019: R-CEOP chemotherapy, Cycle 2 Day 1 8) 10/26/2019: R-CEOP chemotherapy Cycle 3 Day 1 9) 11/30/2019- 12/23/2019: Radiation therapy with Dr. Isidore Moos  HISTORY OF PRESENTING ILLNESS:  Charles Li 79 y.o. male with medical history significant for left tonsillar B cell lymphoma presents for f/u. He was last seen on 12/09/2019. In the interim he has completed radiation therapy.   On exam today [name] notes that if he could get his taste back everything would be just great.  He reports that he does drink boost and Ensure and gets a hint of chocolate flavor out of that but that typically his second bite of food is lacking in all flavor.  He notes that he ate a fruit cup and he felt like chewing on straw.  He does note that he gets some  flavor from barbecue, broccoli and cheese soup.  The patient notes that he is not having any issues with recurrent pain in his throat, though he does have some skin lesions from the radiation therapy which are currently healing.  Overall Mr. Li tolerated his radiation therapy well.  He denies having any issues with fevers, chills, sweats, nausea, vomiting or diarrhea.  A full 10 point ROS is listed below.  MEDICAL HISTORY:  Past Medical History:  Diagnosis Date  . Abdominal pain, epigastric 07/14/2009  . Abdominal wall hernia   . ALLERGIC RHINITIS 12/12/2006  . ASTHMA 11/28/2007  . 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 Complication of anesthesia    Anxiety with gas  . Diffuse large B cell lymphoma ([place])   . GLAUCOMA 12/12/2006  . 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 HERNIA, VENTRAL 01/04/2009  . HYPERLIPIDEMIA 11/28/2007  . HYPERTENSION 12/12/2006  . Leg swelling 04-24-12   occ., none at present  . Persistent atrial fibrillation ([place])   . RENAL CALCULUS 03/24/2010  . VENOUS INSUFFICIENCY 01/18/2010  . Visual disturbance 04-24-12   legally blind    ALLERGIES:  is allergic to dorzolamide, acetazolamide, bimatoprost, brimonidine, sulfa antibiotics, sulfamethoxazole, dorzolamide hcl-timolol mal, erythromycin, penicillins, and sulfonamide derivatives.  MEDICATIONS:  Current Outpatient Medications  Medication Sig Dispense Refill  . amLODipine (NORVASC) 2.5 MG tablet Take 1 tablet (2.5 mg total) by mouth daily. 90 tablet 1  . apixaban (ELIQUIS) 5 MG TABS tablet Take 1 tablet (5 mg total) by mouth 2 (two) times daily. 180 tablet 3  . benazepril-hydrochlorthiazide (LOTENSIN HCT) 20-12.5 MG tablet Take 1 tablet by  mouth daily. 90 tablet 2  . dorzolamide-timolol (COSOPT) 22.3-6.8 MG/ML ophthalmic solution Place 1 drop into both eyes 2 (two) times daily.     . Multiple Vitamin (MULTIVITAMIN WITH MINERALS) TABS tablet Take 1 tablet by mouth daily.    . Netarsudil-Latanoprost (ROCKLATAN) 0.02-0.005 % SOLN  Place 1 drop into both eyes at bedtime.     . pravastatin (PRAVACHOL) 40 MG tablet Take 1 tablet (40 mg total) by mouth daily. Please schedule an appointment for further refills. [phone] (Patient taking differently: Take 40 mg by mouth every evening. Please schedule an appointment for further refills. [phone]) 90 tablet 3   No current facility-administered medications for this visit.    REVIEW OF SYSTEMS:   Constitutional: ( - ) fevers, ( - )  chills , ( - ) night sweats (+) hair loss Eyes: ( - ) blurriness of vision, ( - ) double vision, ( - ) watery eyes Ears, nose, mouth, throat, and face: ( - ) mucositis, ( + ) sore throat Respiratory: ( - ) cough, ( - ) dyspnea, ( - ) wheezes Cardiovascular: ( - ) palpitation, ( - ) chest discomfort, ( - ) lower extremity swelling Gastrointestinal:  ( - ) nausea, ( - ) heartburn, ( - ) change in bowel habits Skin: ( - ) abnormal skin rashes Lymphatics: ( - ) new lymphadenopathy, ( - ) easy bruising Neurological: ( - ) numbness, ( - ) tingling, ( - ) new weaknesses Behavioral/Psych: ( - ) mood change, ( - ) new changes  All other systems were reviewed with the patient and are negative.  PHYSICAL EXAMINATION: ECOG PERFORMANCE STATUS: 1 - Symptomatic but completely ambulatory  Vitals:   01/07/20 0955  BP: (!) 141/75  Pulse: (!) 50  Resp: 16  Temp: 98.1 F (36.7 C)  SpO2: 100%   Filed Weights   01/07/20 0955  Weight: 179 lb 4.8 oz (81.3 kg)    GENERAL: well appearing elderly Caucasian male in NAD,with short hair growing back SKIN: skin color, texture, turgor are normal, no rashes or significant lesions. Small scabbed over lesions in various stage of healing around neck and chest OROPHARYNX: no swelling, erythema, enlarged tonsils or new lesions.  EYES: conjunctiva are pink and non-injected, sclera clear LUNGS: clear to auscultation and percussion with normal breathing effort HEART: regular rate & rhythm and no murmurs and no lower  extremity edema Musculoskeletal: no cyanosis of digits and no clubbing  PSYCH: alert & oriented x 3, fluent speech NEURO: no focal motor/sensory deficits  LABORATORY DATA:  I have reviewed the data as listed CBC Latest Ref Rng & Units 01/07/2020 12/09/2019 12/03/2019  WBC 4.0 - 10.5 K/uL 6.5 7.3 8.0  Hemoglobin 13.0 - 17.0 g/dL 13.5 12.3(L) 12.3(L)  Hematocrit 39 - 52 % 38.9(L) 35.6(L) 37.8(L)  Platelets 150 - 400 K/uL 182 219 250    CMP Latest Ref Rng & Units 01/07/2020 12/09/2019 12/03/2019  Glucose 70 - 99 mg/dL 144(H) 115(H) 133(H)  BUN 8 - 23 mg/dL 13 22 14   Creatinine 0.61 - 1.24 mg/dL 0.87 0.87 0.90  Sodium 135 - 145 mmol/L 141 141 140  Potassium 3.5 - 5.1 mmol/L 3.7 3.7 4.8  Chloride 98 - 111 mmol/L 104 104 101  CO2 22 - 32 mmol/L 29 27 29   Calcium 8.9 - 10.3 mg/dL 9.2 9.2 9.3  Total Protein 6.5 - 8.1 g/dL 6.0(L) 5.8(L) 5.9(L)  Total Bilirubin 0.3 - 1.2 mg/dL 0.6 1.0 0.8  Alkaline Phos 38 - 126  U/L 56 48 45  AST 15 - 41 U/L 16 15 18   ALT 0 - 44 U/L 14 13 14     PATHOLOGY:  SURGICAL PATHOLOGY  CASE: [id number]  PATIENT: Tramond Michelin  Surgical Pathology Report   Clinical History: tonsil cancer (cm)   FINAL MICROSCOPIC DIAGNOSIS:   A. TONSIL, LEFT, BIOPSY:  - Large B-cell lymphoma  - See comment   COMMENT:  The tonsillar tissue has an ulcerated surface with underlying atypical  cellular proliferation. By immunohistochemistry, the neoplastic cells  are positive for CD20, CD10, BCL6 and BCL2 (weak) but negative for CD3,  CD56, CD5, Mum-1, cytokeratin AE1/3 and EBV by in situ hybridization.  The proliferative rate by Ki-67 is 70 to 80%. Overall, the features are  consistent with a large B-cell lymphoma. The differential diagnosis  includes diffuse large B-cell lymphoma and high-grade B-cell lymphoma  (double hit). FISH is pending and will be reported in an addendum.   Preliminary results of this case were given to Dr. Lucia Gaskins on August 14, 2019.   Dr.  Tresa Moore reviewed the case and agrees with the above diagnosis.   GROSS DESCRIPTION:   The specimen is received in formalin and consists of a 0.9 x 0.4 x 0.4  cm piece of tan soft tissue. The specimen is entirely submitted in 1  cassette. Craig Staggers 08/14/2019)   Final Diagnosis performed by Thressa Sheller, MD.  Electronically signed  08/14/2019  Technical and / or Professional components performed at [facility]. [facility], [place] [address] [place], [place] [address].  Immunohistochemistry Technical component (if applicable) was performed  at [facility]. [address] [place],  [place], [place] [address].  IMMUNOHISTOCHEMISTRY DISCLAIMER (if applicable):  Some of these immunohistochemical stains may have been developed and the  performance characteristics determine by [facility]. Some  may not have been cleared or approved by the U.S. Food and Drug  Administration. The FDA has determined that such clearance or approval  is not necessary. This test is used for clinical purposes. It should not  be regarded as investigational or for research. This laboratory is  certified under the [place]  (CLIA-88) as qualified to perform high complexity clinical laboratory  testing. The controls stained appropriately.  SURGICAL PATHOLOGY  ** THIS IS AN ADDENDUM REPORT **  CASE: [id number]  PATIENT: Jakhi Hallenbeck  Bone Marrow Report  **Addendum **   Reason for Addendum #1: Cytogenetics results   Clinical History: lymphoma, DLBCL, left posterior iliac, (ADC)    DIAGNOSIS:   BONE MARROW, ASPIRATE, CLOT, CORE:  - Normocellular marrow with trilineage hematopoiesis  - No lymphoma identified   PERIPHERAL BLOOD:  - Morphologically unremarkable  - See complete blood cell count   MICROSCOPIC DESCRIPTION:   PERIPHERAL BLOOD SMEAR: The peripheral blood is morphologically  unremarkable.   BONE MARROW ASPIRATE:  Spicular, cellular and adequate for evaluation  Erythroid precursors: Orderly maturation without overt dysplasia  Granulocytic precursors: Orderly maturation without overt dysplasia  Megakaryocytes: Qualitatively and quantitatively unremarkable  Lymphocytes/plasma cells: No lymphocytosis or plasmacytosis   TOUCH PREPARATIONS: Hypocellular with no additional findings ascompared  to the aspirate smears.   CLOT AND BIOPSY: Examination of the core biopsy and clot section reveals  a normocellular bone marrow (30-40%) with trilineage hematopoiesis.  Myeloid and erythroid elements are present in essentially normal  proportions. Megakaryocytes display a spectrum of maturation without  clustering. There are no granulomas identified. A single well-formed  lymphoid  aggregate is noted on the clot section. By  immunohistochemistry, is composed of an admixture of B and T cells  (CD20, CD3) without aberrant B-cell expression of CD5 or CD10.   IRON STAIN: Iron stains are performed on a bone marrow aspirate or touch  imprint smear and section of clot. The controls stained appropriately.     Storage Iron: Present    Ring Sideroblasts: Not identified   ADDITIONAL DATA/TESTING: Cytogenetics is pending and will be reported  separately. Flow cytometry performed on the bone marrow biopsy did not  identify a monoclonal B-cell or phenotypically aberrant T-cell  population (See [id number]).   CELL COUNT DATA:   Bone Marrow count performed on 500 cells shows:  Blasts:  0%  Myeloid: 57%  Promyelocytes: 0%  Erythroid:   28%  Myelocytes:  10% Lymphocytes:  7%  Metamyelocytes:   2%  Plasma cells: 8%  Bands:  7%  Neutrophils:  33% M:E ratio:   2.03  Eosinophils:  5%  Basophils:   0%  Monocytes:   0%   Lab Data: CBC performed on 09/04/2019 shows:  WBC: 8.6 k/uL Neutrophils:  63%  Hgb: 14.2 g/dL Lymphocytes:  30%  HCT: 41.2 %  Monocytes:   3%  MCV: 94.7 fL   Eosinophils:  4%  RDW: 11.8 %  Basophils:   0%  PLT: 263 k/uL   GROSS DESCRIPTION:   A: Aspirate smear   B: Received in B-plus fixative are tissue fragments measuring 2.1 x 1.0  x 0.3 cm in aggregate. The specimen is submitted in total.   C: Received in B-plus fixative is a 2.0 x 0.2 cm core of bone which is  submitted in toto fine decalcification. [facility] 09/04/2019)    Final Diagnosis performed by Thressa Sheller, MD.  Electronically signed  09/08/2019  Technical component performed at [facility]  [address] [place], [place] [address].  Professional component performed at [employer]. [facility],  [place] [address] [place], [place] [address].  Immunohistochemistry Technical component (if applicable) was performed  at [facility]. [address] [place],  [place], [place] [address].  IMMUNOHISTOCHEMISTRY DISCLAIMER (if applicable):  Some of these immunohistochemical stains may have been developed and the  performance characteristics determine by [facility]. Some  may not have been cleared or approved by the U.S. Food and Drug  Administration. The FDA has determined that such clearance or approval  is not necessary. This test is used for clinical purposes. It should not  be regarded as investigational or for research. This laboratory is  certified under the [place]  (CLIA-88) as qualified to perform high complexity clinical laboratory  testing. The controls stained appropriately.   ADDENDUM:   ** Please note this testing was performed and interpreted by an outside  facility. This addendum is only being added to provide a summary of the  results for report completeness. Please see electronic medical record  for a copy of the full report. **   Karyotype: 47,?XY,?del(?9)?(?q13q22)?,?+?mar[?3]?/?46,?XY[?20]?  Interpretation: ABNORMAL MALE KARYOTYPE   Cytogenetic  analysis shows an abnormal male karyotype. 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 The remaining twenty cells show a normal  karyotype.   Deletion of chromosome 9q occurs in both myeloid (more common) and  lymphoid disorders, including acute myeloid leukemia (AML) (more  commonly) and myelodysplastic syndrome (MDS) (only  rarely). In MDS the  prognostic significance would be expected to be intermediate.  In AML  the prognosis is variable. Marker chromosomes and structural  chromosomal aberrations in the absence of autosomal monosomies have  minimal prognostic impact. Correlation with other clinical and  laboratory findings is indicated.    RADIOGRAPHIC STUDIES:  No results found.  ASSESSMENT & PLAN Charles Li 79 y.o. 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   In the interim since his last visit the patient has completed radiation therapy and has received 17 of 17 planned fractions.  The patient tolerated the treatment well but now has some residual issues with taste and skin lesion that are now healing. He does not currently have any other systemic symptoms.  Review of Mr. Breisch's PET CT scan from 11/17/2019 is reassuring with complete resolution of FDG avidity in the involved lymph nodes.   The treatment plan was for Rituximab 375 mg/m2, Cyclophosphamide 720m/m2, etoposide IV 585mm2 (followed by 10020m2 PO on Day 2 and 3), and Vincristine 1.4 mg/m2 all on Day 1. The patient will also received PO prednisone 100m29my 1-5 of each 3 week cycle. Patient recieved ISRT with Dr. SquiIsidore Moosmpleted on 12/23/2019.   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 Bone marrow biopsy confirmed no alternative sites of lymphoma.  -- started R-CEOP  chemotherapy on 09/14/2019. This is therapy with curative intent. Etoposide was substituted for anthracycline therapy due to his atrial fibrillation found on TTE.  -- At this time his findings are consistent with a Stage I. Patient completed 3 cycles of R-CEOP with ISRT to site of disease. Interval PET scan on 11/17/2019 showed no active disease. Patient completed radiation therapy on 12/23/2019. --routine follow up imaging is not recommended per NCCN guidelines. This can be performed as clinically indicated.   --RTC in 3 months for continued post treatment surviellance  #Oropharynx Pain, resolved --not currently requiring pain medication  --recommend viscous lidocaine (tried prior for his tumor pain ,though for his current pain it may be more effective).  --additionally can consider warm salt water rinses.  --continue to monitor  #Symptom Management --no longer requires anti-nausea medication. Never needed during chemotherapy.  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 We were agreeable to deferring evaluation to a later time.  --kidney function is stable and he has no urinary/pain symptoms --referral to [facility] again today.   #Atrial Fibrillation --currently following with cardiology, underwent cardioversion on 12/16/2019.  --no bleeding, bruising, or other issues on Eliquis.   No orders of the defined types were placed in this encounter.   All questions were answered. The patient knows to call the clinic with any problems, questions or concerns.  A total of more than 30 minutes were spent on this encounter and over half of that time was spent on counseling and coordination of care as outlined above.   JohnLedell Peoples Department of Hematology/Oncology [facility]: [phone]: [phone]: [email or web address]  01/07/2020 10:17 AM

## 2020-01-07 NOTE — Patient Instructions (Signed)

## 2020-01-08 ENCOUNTER — Ambulatory Visit
Admission: RE | Admit: 2020-01-08 | Discharge: 2020-01-08 | Disposition: A | Discharge status: Home / Self Care | Payer: Medicare HMO | Source: Ambulatory Visit | Attending: Radiation Oncology | Admitting: Radiation Oncology

## 2020-01-08 ENCOUNTER — Telehealth: Payer: Self-pay | Admitting: Hematology and Oncology

## 2020-01-08 VITALS — BP 173/81 | HR 50 | Temp 98.0°F | Resp 20 | Ht 70.0 in | Wt 178.8 lb

## 2020-01-08 DIAGNOSIS — C8331 Diffuse large B-cell lymphoma, lymph nodes of head, face, and neck: Secondary | ICD-10-CM

## 2020-01-08 NOTE — Progress Notes (Signed)
Oncology Nurse Navigator Documentation  I met with Charles Li and his wife during his follow up appointment with Dr. Isidore Moos today. He is feeling well, and will continue to follow up with Dr. Lorenso Courier. He knows that he can call me if he has any further questions or concerns.   Charles Asa RN, BSN, OCN Head & Neck Oncology Nurse [place] at [facility] Phone # [phone]  Fax # [phone]

## 2020-01-08 NOTE — Telephone Encounter (Signed)
Scheduled per los. Called and spoke with patient. Confirmed appt 

## 2020-01-08 NOTE — Progress Notes (Signed)
Radiation Oncology         [phone] ________________________________  Name: MANDO BLATZ MRN: [id number]  Date: 01/08/2020  DOB: [birth year]  Follow-Up Visit Note  Outpatient  CC: Eulas Post, MD  Eulas Post, MD  Diagnosis and Prior Radiotherapy:    ICD-10-CM   1. Diffuse large B-cell lymphoma of lymph nodes of neck (HCC)  C83.31      CHIEF COMPLAINT: Here for follow-up and surveillance of lymphoma of tonsil  Narrative:  The patient returns today for routine follow-up.    Mr. Ferrebee presents today for follow up of radiation completed on 12/23/2019 to his left tonsil  Pain issues, if any: Patient denies Using a feeding tube?: N/A Weight changes, if any:  Wt Readings from Last 3 Encounters:  01/08/20 178 lb 12.8 oz (81.1 kg)  01/07/20 179 lb 4.8 oz (81.3 kg)  12/24/19 180 lb 3.2 oz (81.7 kg)   Swallowing issues, if any: Patient denies. Still has lack of taste, but improving. Drinking ~2 bottles of Ensure supplement daily Smoking or chewing tobacco? None Using fluoride trays daily? N/A Last ENT visit was on: Not since diagnosis  Other notable issues, if any: Reports a rash that developed at the base of his neck, down about 2" of his back and around the left side of shoulder. Patient reports rash seems to be resolving now.   F/U with Dr. Narda Rutherford on 01/07/2020:  "--routine follow up imaging is not recommended per NCCN guidelines. This can be performed as clinically indicated.   --RTC in 3 months for continued post treatment surveillance --not currently requiring pain medication  --recommend viscous lidocaine (tried prior for his tumor pain ,though for his current pain it may be more effective).  --additionally can consider warm salt water rinses.  --continue to monitor --no longer requires anti-nausea medication. Never needed during chemotherapy. "                             ALLERGIES:  is allergic to dorzolamide, acetazolamide, bimatoprost,  brimonidine, sulfa antibiotics, sulfamethoxazole, dorzolamide hcl-timolol mal, erythromycin, penicillins, and sulfonamide derivatives.  Meds: Current Outpatient Medications  Medication Sig Dispense Refill  . amLODipine (NORVASC) 2.5 MG tablet Take 1 tablet (2.5 mg total) by mouth daily. 90 tablet 1  . apixaban (ELIQUIS) 5 MG TABS tablet Take 1 tablet (5 mg total) by mouth 2 (two) times daily. 180 tablet 3  . benazepril-hydrochlorthiazide (LOTENSIN HCT) 20-12.5 MG tablet Take 1 tablet by mouth daily. 90 tablet 2  . dorzolamide-timolol (COSOPT) 22.3-6.8 MG/ML ophthalmic solution Place 1 drop into both eyes 2 (two) times daily.     . Multiple Vitamin (MULTIVITAMIN WITH MINERALS) TABS tablet Take 1 tablet by mouth daily.    . Netarsudil-Latanoprost (ROCKLATAN) 0.02-0.005 % SOLN Place 1 drop into both eyes at bedtime.     . pravastatin (PRAVACHOL) 40 MG tablet Take 1 tablet (40 mg total) by mouth daily. Please schedule an appointment for further refills. [phone] (Patient taking differently: Take 40 mg by mouth every evening. Please schedule an appointment for further refills. [phone]) 90 tablet 3   No current facility-administered medications for this encounter.    Physical Findings: The patient is in no acute distress. Patient is alert and oriented.  height is 5\' 10"  (1.778 m) and weight is 178 lb 12.8 oz (81.1 kg). His temperature is 98 F (36.7 C). His blood pressure is 173/81 (abnormal) and his pulse is  50 (abnormal). His respiration is 20 and oxygen saturation is 99%. .    Skin over neck has healed well. No tumor or mucositis or thrush in throat/ mouth No palpable neck masses   Lab Findings: Lab Results  Component Value Date   WBC 6.5 01/07/2020   HGB 13.5 01/07/2020   HCT 38.9 (L) 01/07/2020   MCV 95.1 01/07/2020   PLT 182 01/07/2020    Radiographic Findings: No results found.  Impression/Plan:  He has recovered well thus far.  Taste changes improving and should  continue to improve with time over the next several months.  Discussed signs/symptoms of recurrence to warrant phone call in the future. He will follow up with med/onc and see me back PRN.  He and his wife are pleased with this plan.  On date of service, in total, I spent 20 minutes on this encounter. Patient was seen in person. _____________________________________   Eppie Gibson, MD

## 2020-01-10 ENCOUNTER — Encounter: Payer: Self-pay | Admitting: Radiation Oncology

## 2020-01-15 DIAGNOSIS — M216X2 Other acquired deformities of left foot: Secondary | ICD-10-CM | POA: Diagnosis not present

## 2020-01-15 DIAGNOSIS — M216X1 Other acquired deformities of right foot: Secondary | ICD-10-CM | POA: Diagnosis not present

## 2020-01-15 DIAGNOSIS — M71571 Other bursitis, not elsewhere classified, right ankle and foot: Secondary | ICD-10-CM | POA: Diagnosis not present

## 2020-01-15 DIAGNOSIS — M71572 Other bursitis, not elsewhere classified, left ankle and foot: Secondary | ICD-10-CM | POA: Diagnosis not present

## 2020-01-22 ENCOUNTER — Other Ambulatory Visit: Payer: Self-pay | Admitting: Radiology

## 2020-01-25 ENCOUNTER — Ambulatory Visit (HOSPITAL_COMMUNITY)
Admission: RE | Admit: 2020-01-25 | Discharge: 2020-01-25 | Disposition: A | Discharge status: Home / Self Care | Payer: Medicare HMO | Source: Ambulatory Visit | Attending: Hematology and Oncology | Admitting: Hematology and Oncology

## 2020-01-25 ENCOUNTER — Encounter (HOSPITAL_COMMUNITY): Payer: Self-pay

## 2020-01-25 ENCOUNTER — Other Ambulatory Visit: Payer: Self-pay

## 2020-01-25 DIAGNOSIS — Z7901 Long term (current) use of anticoagulants: Secondary | ICD-10-CM | POA: Insufficient documentation

## 2020-01-25 DIAGNOSIS — Z95828 Presence of other vascular implants and grafts: Secondary | ICD-10-CM

## 2020-01-25 DIAGNOSIS — Z452 Encounter for adjustment and management of vascular access device: Secondary | ICD-10-CM | POA: Insufficient documentation

## 2020-01-25 DIAGNOSIS — Z87891 Personal history of nicotine dependence: Secondary | ICD-10-CM | POA: Diagnosis not present

## 2020-01-25 DIAGNOSIS — C8331 Diffuse large B-cell lymphoma, lymph nodes of head, face, and neck: Secondary | ICD-10-CM | POA: Diagnosis not present

## 2020-01-25 DIAGNOSIS — Z8572 Personal history of non-Hodgkin lymphomas: Secondary | ICD-10-CM | POA: Diagnosis not present

## 2020-01-25 DIAGNOSIS — Z79899 Other long term (current) drug therapy: Secondary | ICD-10-CM | POA: Insufficient documentation

## 2020-01-25 DIAGNOSIS — E785 Hyperlipidemia, unspecified: Secondary | ICD-10-CM | POA: Diagnosis not present

## 2020-01-25 DIAGNOSIS — I10 Essential (primary) hypertension: Secondary | ICD-10-CM | POA: Insufficient documentation

## 2020-01-25 DIAGNOSIS — I4819 Other persistent atrial fibrillation: Secondary | ICD-10-CM | POA: Insufficient documentation

## 2020-01-25 HISTORY — PX: IR REMOVAL TUN ACCESS W/ PORT W/O FL MOD SED: IMG2290

## 2020-01-25 LAB — CBC WITH DIFFERENTIAL/PLATELET
Abs Immature Granulocytes: 0.02 10*3/uL (ref 0.00–0.07)
Basophils Absolute: 0 10*3/uL (ref 0.0–0.1)
Basophils Relative: 1 %
Eosinophils Absolute: 0.2 10*3/uL (ref 0.0–0.5)
Eosinophils Relative: 3 %
HCT: 43.4 % (ref 39.0–52.0)
Hemoglobin: 14.7 g/dL (ref 13.0–17.0)
Immature Granulocytes: 0 %
Lymphocytes Relative: 12 %
Lymphs Abs: 0.8 10*3/uL (ref 0.7–4.0)
MCH: 32.6 pg (ref 26.0–34.0)
MCHC: 33.9 g/dL (ref 30.0–36.0)
MCV: 96.2 fL (ref 80.0–100.0)
Monocytes Absolute: 0.8 10*3/uL (ref 0.1–1.0)
Monocytes Relative: 12 %
Neutro Abs: 4.7 10*3/uL (ref 1.7–7.7)
Neutrophils Relative %: 72 %
Platelets: 195 10*3/uL (ref 150–400)
RBC: 4.51 MIL/uL (ref 4.22–5.81)
RDW: 11.8 % (ref 11.5–15.5)
WBC: 6.5 10*3/uL (ref 4.0–10.5)
nRBC: 0 % (ref 0.0–0.2)

## 2020-01-25 LAB — PROTIME-INR
INR: 1 (ref 0.8–1.2)
Prothrombin Time: 13.1 seconds (ref 11.4–15.2)

## 2020-01-25 MED ORDER — MIDAZOLAM HCL 2 MG/2ML IJ SOLN
INTRAMUSCULAR | Status: AC
  Filled 2020-01-25: qty 2

## 2020-01-25 MED ORDER — SODIUM CHLORIDE 0.9 % IV SOLN: INTRAVENOUS | Status: DC

## 2020-01-25 MED ORDER — MIDAZOLAM HCL 2 MG/2ML IJ SOLN
INTRAMUSCULAR | Status: AC | PRN
  Administered 2020-01-25 (×2): 0.5 mg via INTRAVENOUS

## 2020-01-25 MED ORDER — FENTANYL CITRATE (PF) 100 MCG/2ML IJ SOLN
INTRAMUSCULAR | Status: AC
  Filled 2020-01-25: qty 2

## 2020-01-25 MED ORDER — FENTANYL CITRATE (PF) 100 MCG/2ML IJ SOLN
INTRAMUSCULAR | Status: AC | PRN
  Administered 2020-01-25: 25 ug via INTRAVENOUS

## 2020-01-25 MED ORDER — CLINDAMYCIN PHOSPHATE 900 MG/50ML IV SOLN
INTRAVENOUS | Status: AC
  Administered 2020-01-25: 900 mg via INTRAVENOUS
  Filled 2020-01-25: qty 50

## 2020-01-25 MED ORDER — CLINDAMYCIN PHOSPHATE 900 MG/50ML IV SOLN: 900.0000 mg | Freq: Once | INTRAVENOUS | Status: AC

## 2020-01-25 MED ORDER — LIDOCAINE-EPINEPHRINE 1 %-1:100000 IJ SOLN
INTRAMUSCULAR | Status: AC
  Filled 2020-01-25: qty 1

## 2020-01-25 MED ORDER — LIDOCAINE-EPINEPHRINE 1 %-1:100000 IJ SOLN
INTRAMUSCULAR | Status: AC | PRN
  Administered 2020-01-25: 10 mL

## 2020-01-25 NOTE — Procedures (Signed)
  Procedure: R chest port catheter removal   EBL:   minimal Complications:  none immediate  See full dictation in [employer].  Dillard Cannon MD Main # [phone] Pager  [phone]

## 2020-01-25 NOTE — H&P (Signed)
Chief Complaint: Patient was seen in consultation today for port removal at the request of Dorsey,John T IV  Referring Physician(s): Dorsey,John T IV  Supervising Physician: Arne Cleveland  Patient Status: [place]  History of Present Illness: Charles Li is a 79 y.o. male with hx of Lymphoma. He had port placed on 09/07/19 and has now completed treatment.  He is referred for port removal. PMHx, meds, labs, imaging, allergies reviewed. Stopped his Eliquis 2 days ago as directed. Feels well, no recent fevers, chills, illness. Has been NPO today as directed.    Past Medical History:  Diagnosis Date  . Abdominal pain, epigastric 07/14/2009  . Abdominal wall hernia   . ALLERGIC RHINITIS 12/12/2006  . ASTHMA 11/28/2007  . 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 Complication of anesthesia    Anxiety with gas  . Diffuse large B cell lymphoma ([place])   . GLAUCOMA 12/12/2006  . 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 HERNIA, VENTRAL 01/04/2009  . HYPERLIPIDEMIA 11/28/2007  . HYPERTENSION 12/12/2006  . Leg swelling 04-24-12   occ., none at present  . Persistent atrial fibrillation ([place])   . RENAL CALCULUS 03/24/2010  . VENOUS INSUFFICIENCY 01/18/2010  . Visual disturbance 04-24-12   legally blind    Past Surgical History:  Procedure Laterality Date  . CARDIOVERSION N/A 12/16/2019   Procedure: CARDIOVERSION;  Surgeon: [name], MD;  Location: [facility];  Service: Cardiovascular;  Laterality: N/A;  . CATARACT EXTRACTION  2009 - approximate   bilat & glaucoma surgery (7 ophth surgeries)  . INGUINAL HERNIA REPAIR  05/02/2012   Procedure: HERNIA REPAIR INGUINAL ADULT BILATERAL;  Surgeon: Shann Medal, MD;  Location: [facility];  Service: General;  Laterality: N/A;  . IR IMAGING GUIDED PORT INSERTION  09/07/2019  . VASECTOMY  04-24-12  . VENTRAL HERNIA REPAIR  05/02/2012   Procedure: LAPAROSCOPIC VENTRAL HERNIA;  Surgeon: Shann Medal, MD;  Location: [facility];  Service: General;   Laterality: N/A;    Allergies: Dorzolamide, Acetazolamide, Bimatoprost, Brimonidine, Sulfa antibiotics, Sulfamethoxazole, Dorzolamide hcl-timolol mal, Erythromycin, Penicillins, and Sulfonamide derivatives  Medications: Prior to Admission medications   Medication Sig Start Date End Date Taking? Authorizing Provider  amLODipine (NORVASC) 2.5 MG tablet Take 1 tablet (2.5 mg total) by mouth daily. 12/24/19   Sherran Needs, NP  apixaban (ELIQUIS) 5 MG TABS tablet Take 1 tablet (5 mg total) by mouth 2 (two) times daily. 11/19/19   Sherran Needs, NP  benazepril-hydrochlorthiazide (LOTENSIN HCT) 20-12.5 MG tablet Take 1 tablet by mouth daily. 12/24/19 12/23/20  Sherran Needs, NP  dorzolamide-timolol (COSOPT) 22.3-6.8 MG/ML ophthalmic solution Place 1 drop into both eyes 2 (two) times daily.  04/01/19 03/31/20  [provider]  Multiple Vitamin (MULTIVITAMIN WITH MINERALS) TABS tablet Take 1 tablet by mouth daily.    [provider]  Netarsudil-Latanoprost (ROCKLATAN) 0.02-0.005 % SOLN Place 1 drop into both eyes at bedtime.  04/01/19   [provider]  pravastatin (PRAVACHOL) 40 MG tablet Take 1 tablet (40 mg total) by mouth daily. Please schedule an appointment for further refills. [phone] Patient taking differently: Take 40 mg by mouth every evening. Please schedule an appointment for further refills. [phone] 07/14/18   Eulas Post, MD     Family History  Problem Relation Age of Onset  . Cancer Mother   . Pneumonia Father   . Heart disease Brother        congen heart disease  . Birth defects Paternal Aunt  lung  . Colon cancer Neg Hx   . Stomach cancer Neg Hx     Social History   Socioeconomic History  . Marital status: Married    Spouse name: Not on file  . Number of children: Not on file  . Years of education: Not on file  . Highest education level: Not on file  Occupational History  . Not on file  Tobacco Use  . Smoking status:  Former Smoker    Packs/day: 1.50    Years: 14.00    Pack years: 21.00    Types: Cigarettes    Quit date: 11/06/1969    Years since quitting: 50.2  . Smokeless tobacco: Never Used  Vaping Use  . Vaping Use: Never used  Substance and Sexual Activity  . Alcohol use: Yes    Alcohol/week: 14.0 standard drinks    Types: 14 Glasses of wine per week    Comment: wine & beer - 3 glasses daily; Waits 10 days after chemo treatment  . Drug use: No  . Sexual activity: Yes  Other Topics Concern  . Not on file  Social History Narrative   Lives in [place] with wife.      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 Difficulty of Paying Living Expenses:   Food Insecurity:   . Worried About Charity fundraiser in the Last Year:   . Arboriculturist in the Last Year:   Transportation Needs:   . Film/video editor (Medical):   Marland Kitchen Lack of Transportation (Non-Medical):   Physical Activity:   . Days of Exercise per Week:   . Minutes of Exercise per Session:   Stress:   . Feeling of Stress :   Social Connections:   . Frequency of Communication with Friends and Family:   . Frequency of Social Gatherings with Friends and Family:   . Attends Religious Services:   . Active Member of Clubs or Organizations:   . Attends Archivist Meetings:   Marland Kitchen Marital Status:      Review of Systems: A 12 point ROS discussed and pertinent positives are indicated in the HPI above.  All other systems are negative.  Review of Systems  Vital Signs: BP (!) 164/78   Pulse (!) 51   Temp 98.1 F (36.7 C) (Oral)   Resp 18   SpO2 100%   Physical Exam Constitutional:      Appearance: Normal appearance. He is not ill-appearing.  HENT:     Mouth/Throat:     Mouth: Mucous membranes are moist.     Pharynx: Oropharynx is clear.  Cardiovascular:     Rate and Rhythm: Normal rate and regular rhythm.     Heart sounds: Normal heart sounds.    Pulmonary:     Effort: Pulmonary effort is normal. No respiratory distress.     Breath sounds: Normal breath sounds.  Skin:    General: Skin is warm and dry.     Comments: (R)chest port palpable. No erythema or swelling  Neurological:     General: No focal deficit present.     Mental Status: He is alert and oriented to person, place, and time.  Psychiatric:        Mood and Affect: Mood normal.        Thought Content: Thought content normal.        Judgment: Judgment normal.  Imaging: No results found.  Labs:  CBC: Recent Labs    11/23/19 0939 12/03/19 0938 12/09/19 0955 01/07/20 0940  WBC 9.3 8.0 7.3 6.5  HGB 12.3* 12.3* 12.3* 13.5  HCT 36.1* 37.8* 35.6* 38.9*  PLT 245 250 219 182    COAGS: No results for input(s): INR, APTT in the last 8760 hours.  BMP: Recent Labs    11/23/19 0939 12/03/19 0938 12/09/19 0955 01/07/20 0940  NA 142 140 141 141  K 3.6 4.8 3.7 3.7  CL 104 101 104 104  CO2 32 29 27 29   GLUCOSE 133* 133* 115* 144*  BUN 16 14 22 13   CALCIUM 9.3 9.3 9.2 9.2  CREATININE 0.86 0.90 0.87 0.87  GFRNONAA >60 >60 >60 >60  GFRAA >60 >60 >60 >60    LIVER FUNCTION TESTS: Recent Labs    11/23/19 0939 12/03/19 0938 12/09/19 0955 01/07/20 0940  BILITOT 0.7 0.8 1.0 0.6  AST 14* 18 15 16   ALT 13 14 13 14   ALKPHOS 62 45 48 56  PROT 5.9* 5.9* 5.8* 6.0*  ALBUMIN 3.3* 3.5 3.5 3.5    TUMOR MARKERS: No results for input(s): AFPTM, CEA, CA199, CHROMGRNA in the last 8760 hours.  Assessment and Plan: Hx of Lymphoma For port removal Risks and benefits of port removal was discussed with the patient including, but not limited to bleeding, infection, or fibrin sheath development and need for additional procedures.  All of the patient's questions were answered, patient is agreeable to proceed. Consent signed and in chart.    Thank you for this interesting consult.  I greatly enjoyed meeting Charles Li and look forward to participating in  their care.  A copy of this report was sent to the requesting provider on this date.  Electronically Signed: Ascencion Dike, PA-C 01/25/2020, 8:31 AM   I spent a total of 20 minutes in face to face in clinical consultation, greater than 50% of which was counseling/coordinating care for port removal

## 2020-01-25 NOTE — Discharge Instructions (Signed)
Please call Interventional Radiology clinic [phone] with any questions or concerns.  You may remove your dressing and shower tomorrow.   Implanted Port Removal, Care After This sheet gives you information about how to care for yourself after your procedure. Your health care provider may also give you more specific instructions. If you have problems or questions, contact your health care provider. What can I expect after the procedure? After the procedure, it is common to have:  Soreness or pain near your incision.  Some swelling or bruising near your incision. Follow these instructions at home: Medicines  Take over-the-counter and prescription medicines only as told by your health care provider.  If you were prescribed an antibiotic medicine, take it as told by your health care provider. Do not stop taking the antibiotic even if you start to feel better. Bathing  Do not take baths, swim, or use a hot tub until your health care provider approves. Ask your health care provider if you can take showers. You may only be allowed to take sponge baths. Incision care   Follow instructions from your health care provider about how to take care of your incision. Make sure you: ? Wash your hands with soap and water before you change your bandage (dressing). If soap and water are not available, use hand sanitizer. ? Change your dressing as told by your health care provider. ? Keep your dressing dry. ? Leave stitches (sutures), skin glue, or adhesive strips in place. These skin closures may need to stay in place for 2 weeks or longer. If adhesive strip edges start to loosen and curl up, you may trim the loose edges. Do not remove adhesive strips completely unless your health care provider tells you to do that.  Check your incision area every day for signs of infection. Check for: ? More redness, swelling, or pain. ? More fluid or blood. ? Warmth. ? Pus or a bad smell. Driving   Do not  drive for 24 hours if you were given a medicine to help you relax (sedative) during your procedure.  If you did not receive a sedative, ask your health care provider when it is safe to drive. Activity  Return to your normal activities as told by your health care provider. Ask your health care provider what activities are safe for you.  Do not lift anything that is heavier than 10 lb (4.5 kg), or the limit that you are told, until your health care provider says that it is safe.  Do not do activities that involve lifting your arms over your head. General instructions  Do not use any products that contain nicotine or tobacco, such as cigarettes and e-cigarettes. These can delay healing. If you need help quitting, ask your health care provider.  Keep all follow-up visits as told by your health care provider. This is important. Contact a health care provider if:  You have more redness, swelling, or pain around your incision.  You have more fluid or blood coming from your incision.  Your incision feels warm to the touch.  You have pus or a bad smell coming from your incision.  You have pain that is not relieved by your pain medicine. Get help right away if you have:  A fever or chills.  Chest pain.  Difficulty breathing. Summary  After the procedure, it is common to have pain, soreness, swelling, or bruising near your incision.  If you were prescribed an antibiotic medicine, take it as told by your health  care provider. Do not stop taking the antibiotic even if you start to feel better. °· Do not drive for 24 hours if you were given a sedative during your procedure. °· Return to your normal activities as told by your health care provider. Ask your health care provider what activities are safe for you. °This information is not intended to replace advice given to you by your health care provider. Make sure you discuss any questions you have with your health care provider. °Document  Revised: 08/08/2017 Document Reviewed: 08/08/2017 °Elsevier Patient Education © 2020 [employer] ° ° °Moderate Conscious Sedation, Adult, Care After °These instructions provide you with information about caring for yourself after your procedure. Your health care provider may also give you more specific instructions. Your treatment has been planned according to current medical practices, but problems sometimes occur. Call your health care provider if you have any problems or questions after your procedure. °What can I expect after the procedure? °After your procedure, it is common: °· To feel sleepy for several hours. °· To feel clumsy and have poor balance for several hours. °· To have poor judgment for several hours. °· To vomit if you eat too soon. °Follow these instructions at home: °For at least 24 hours after the procedure: ° °· Do not: °? Participate in activities where you could fall or become injured. °? Drive. °? Use heavy machinery. °? Drink alcohol. °? Take sleeping pills or medicines that cause drowsiness. °? Make important decisions or sign legal documents. °? Take care of children on your own. °· Rest. °Eating and drinking °· Follow the diet recommended by your health care provider. °· If you vomit: °? Drink water, juice, or soup when you can drink without vomiting. °? Make sure you have little or no nausea before eating solid foods. °General instructions °· Have a responsible adult stay with you until you are awake and alert. °· Take over-the-counter and prescription medicines only as told by your health care provider. °· If you smoke, do not smoke without supervision. °· Keep all follow-up visits as told by your health care provider. This is important. °Contact a health care provider if: °· You keep feeling nauseous or you keep vomiting. °· You feel light-headed. °· You develop a rash. °· You have a fever. °Get help right away if: °· You have trouble breathing. °This information is not intended to  replace advice given to you by your health care provider. Make sure you discuss any questions you have with your health care provider. °Document Revised: 06/07/2017 Document Reviewed: 10/15/2015 °Elsevier Patient Education © 2020 [employer] Inc. ° ° °

## 2020-02-12 DIAGNOSIS — M21621 Bunionette of right foot: Secondary | ICD-10-CM | POA: Diagnosis not present

## 2020-02-12 DIAGNOSIS — M71572 Other bursitis, not elsewhere classified, left ankle and foot: Secondary | ICD-10-CM | POA: Diagnosis not present

## 2020-02-12 DIAGNOSIS — M71571 Other bursitis, not elsewhere classified, right ankle and foot: Secondary | ICD-10-CM | POA: Diagnosis not present

## 2020-02-12 DIAGNOSIS — M21622 Bunionette of left foot: Secondary | ICD-10-CM | POA: Diagnosis not present

## 2020-02-22 NOTE — Progress Notes (Signed)
  Patient Name: Charles Li MRN: [id number] DOB: 1940-07-15 Referring Physician: Carolann Littler (Profile Not Attached) Date of Service: 12/23/2019 [facility] [place]                                                        End Of Treatment Note  Diagnoses: C83.31-Diffuse large b-cell lymphoma, lymph nodes of head, face, and neck  Cancer Staging:  IIA   Intent: Curative  Radiation Treatment Dates: 11/30/2019 through 12/23/2019  Site Technique Total Dose (Gy) Dose per Fx (Gy) Completed Fx Beam Energies  Tonsil, Left: HN_Lt_tonsil IMRT 30.6/30.6 1.8 17/17 6X   Narrative: The patient tolerated radiation therapy relatively well.   Plan: The patient will follow-up with radiation oncology in 2-4 wks. -----------------------------------  Eppie Gibson, MD

## 2020-02-25 ENCOUNTER — Other Ambulatory Visit (HOSPITAL_COMMUNITY): Payer: Self-pay

## 2020-02-25 ENCOUNTER — Other Ambulatory Visit (HOSPITAL_COMMUNITY): Payer: Self-pay | Admitting: Nurse Practitioner

## 2020-02-25 MED ORDER — BENAZEPRIL-HYDROCHLOROTHIAZIDE 20-12.5 MG PO TABS: 1.0000 | ORAL_TABLET | Freq: Every day | ORAL | 0 refills | Status: DC

## 2020-02-25 MED ORDER — BENAZEPRIL-HYDROCHLOROTHIAZIDE 20-12.5 MG PO TABS: 1.0000 | ORAL_TABLET | Freq: Every day | ORAL | 0 refills | Status: AC

## 2020-02-25 NOTE — Addendum Note (Signed)
Addended by: Maryln Manuel F on: 02/25/2020 09:29 AM   Modules accepted: Orders

## 2020-04-06 ENCOUNTER — Other Ambulatory Visit: Payer: Self-pay | Admitting: Hematology and Oncology

## 2020-04-06 DIAGNOSIS — C8331 Diffuse large B-cell lymphoma, lymph nodes of head, face, and neck: Secondary | ICD-10-CM

## 2020-04-06 NOTE — Progress Notes (Signed)
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 Lucia Gaskins for tonsillar mass present for a few weeks. 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 FISH studies pending.  2) 08/17/2019: establish care with Dr. Lorenso Courier   3) 08/28/2019: PET CT scan reveals activity in left lingual tonsil, level 2 lymph node, and level 3 lymph node. Consistent with Stage I 4) 09/04/2019: Bone marrow biopsy shows no evidence of lymphoma in the bone marrow.  5) 09/04/2019; TTE showed EF 60-65%, however patient found to have new atrial fibrillation.  6) 09/14/2019: started R-CEOP chemotherapy, Cycle 1 Day 1 7) 10/05/2019: R-CEOP chemotherapy, Cycle 2 Day 1 8) 10/26/2019: R-CEOP chemotherapy Cycle 3 Day 1 9) 11/30/2019- 12/23/2019: Radiation therapy with Dr. Isidore Moos  HISTORY OF PRESENTING ILLNESS:  Charles Li 79 y.o. male with medical history significant for left tonsillar B cell lymphoma presents for f/u. He was last seen on 01/07/2020. In the interim he had his port removed on 01/25/2020.   On exam today Charles Li notes that he is doing quite well.  His hair is begun to grow back and he has good levels of energy.  He notes that his appetite is been "okay" and the main reason for this has been that his taste buds still have not come back entirely.  He reports that they are improving and he is getting some flavor from foods such as barbecue, and  yogurt.  He notes that his swallowing is good and is not having any pain in the back of his throat.  He does note that other foods to taste like soggy cardboard.  He otherwise denies having issues with fevers, chills, sweats, nausea, vomiting or diarrhea.  He denies having any new lymphadenopathy, weakness, shortness of breath, or fatigue.  A full 10 point ROS is listed below.  MEDICAL HISTORY:  Past Medical History:  Diagnosis Date  . Abdominal pain, epigastric 07/14/2009  . Abdominal wall hernia   . ALLERGIC RHINITIS 12/12/2006  . ASTHMA 11/28/2007  . 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 Complication of anesthesia    Anxiety with gas  . Diffuse large B cell lymphoma ([place])   . GLAUCOMA 12/12/2006  . 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 HERNIA, VENTRAL 01/04/2009  . HYPERLIPIDEMIA 11/28/2007  . HYPERTENSION 12/12/2006  . Leg swelling 04-24-12   occ., none at present  . Persistent atrial fibrillation ([place])   . RENAL CALCULUS 03/24/2010  . VENOUS INSUFFICIENCY 01/18/2010  . Visual disturbance 04-24-12   legally blind    ALLERGIES:  is allergic to dorzolamide, acetazolamide, bimatoprost, brimonidine, sulfa antibiotics, sulfamethoxazole, dorzolamide hcl-timolol mal, erythromycin, penicillins, and sulfonamide derivatives.  MEDICATIONS:  Current Outpatient Medications  Medication Sig Dispense Refill  . amLODipine (NORVASC) 2.5 MG tablet Take 1 tablet (2.5 mg total) by mouth daily. 90 tablet 1  . apixaban (ELIQUIS) 5 MG TABS tablet Take 1 tablet (5 mg total) by mouth 2 (two) times daily. 180 tablet 3  . benazepril-hydrochlorthiazide (LOTENSIN HCT) 20-12.5 MG tablet Take 1 tablet by mouth daily. [place]  tablet 0  . Multiple Vitamin (MULTIVITAMIN WITH MINERALS) TABS tablet Take 1 tablet by mouth daily.    . Netarsudil-Latanoprost (ROCKLATAN) 0.02-0.005 % SOLN Place 1 drop into both eyes at bedtime.     . pravastatin (PRAVACHOL) 40 MG tablet Take 1 tablet (40 mg total) by mouth daily. Please schedule an appointment  for further refills. [phone] (Patient taking differently: Take 40 mg by mouth every evening. Please schedule an appointment for further refills. [phone]) 90 tablet 3   No current facility-administered medications for this visit.    REVIEW OF SYSTEMS:   Constitutional: ( - ) fevers, ( - )  chills , ( - ) night sweats (+) hair loss Eyes: ( - ) blurriness of vision, ( - ) double vision, ( - ) watery eyes Ears, nose, mouth, throat, and face: ( - ) mucositis, ( + ) sore throat Respiratory: ( - ) cough, ( - ) dyspnea, ( - ) wheezes Cardiovascular: ( - ) palpitation, ( - ) chest discomfort, ( - ) lower extremity swelling Gastrointestinal:  ( - ) nausea, ( - ) heartburn, ( - ) change in bowel habits Skin: ( - ) abnormal skin rashes Lymphatics: ( - ) new lymphadenopathy, ( - ) easy bruising Neurological: ( - ) numbness, ( - ) tingling, ( - ) new weaknesses Behavioral/Psych: ( - ) mood change, ( - ) new changes  All other systems were reviewed with the patient and are negative.  PHYSICAL EXAMINATION: ECOG PERFORMANCE STATUS: 1 - Symptomatic but completely ambulatory  There were no vitals filed for this visit. There were no vitals filed for this visit.  GENERAL: well appearing elderly Caucasian male in NAD SKIN: skin color, texture, turgor are normal, no rashes or significant lesions.  OROPHARYNX: no swelling, erythema, enlarged tonsils or new lesions.  EYES: conjunctiva are pink and non-injected, sclera clear LUNGS: clear to auscultation and percussion with normal breathing effort HEART: regular rate & rhythm and no murmurs and no lower extremity edema Musculoskeletal: no cyanosis of digits and no clubbing  PSYCH: alert & oriented x 3, fluent speech NEURO: no focal motor/sensory deficits  LABORATORY DATA:  I have reviewed the data as listed CBC Latest Ref Rng & Units 01/25/2020 01/07/2020 12/09/2019  WBC 4.0 - 10.5 K/uL 6.5 6.5 7.3  Hemoglobin 13.0 - 17.0 g/dL 14.7 13.5 12.3(L)   Hematocrit 39 - 52 % 43.4 38.9(L) 35.6(L)  Platelets 150 - 400 K/uL 195 182 219    CMP Latest Ref Rng & Units 01/07/2020 12/09/2019 12/03/2019  Glucose 70 - 99 mg/dL 144(H) 115(H) 133(H)  BUN 8 - 23 mg/dL 13 22 14   Creatinine 0.61 - 1.24 mg/dL 0.87 0.87 0.90  Sodium 135 - 145 mmol/L 141 141 140  Potassium 3.5 - 5.1 mmol/L 3.7 3.7 4.8  Chloride 98 - 111 mmol/L 104 104 101  CO2 22 - 32 mmol/L 29 27 29   Calcium 8.9 - 10.3 mg/dL 9.2 9.2 9.3  Total Protein 6.5 - 8.1 g/dL 6.0(L) 5.8(L) 5.9(L)  Total Bilirubin 0.3 - 1.2 mg/dL 0.6 1.0 0.8  Alkaline Phos 38 - 126 U/L 56 48 45  AST 15 - 41 U/L 16 15 18   ALT 0 - 44 U/L 14 13 14     PATHOLOGY:  SURGICAL PATHOLOGY  CASE: [id number]  PATIENT: Charles Li  Surgical Pathology Report   Clinical History: tonsil cancer (cm)   FINAL MICROSCOPIC DIAGNOSIS:   A. TONSIL, LEFT, BIOPSY:  - Large B-cell lymphoma  - See comment  COMMENT:  The tonsillar tissue has an ulcerated surface with underlying atypical  cellular proliferation. By immunohistochemistry, the neoplastic cells  are positive for CD20, CD10, BCL6 and BCL2 (weak) but negative for CD3,  CD56, CD5, Mum-1, cytokeratin AE1/3 and EBV by in situ hybridization.  The proliferative rate by Ki-67 is 70 to 80%. Overall, the features are  consistent with a large B-cell lymphoma. The differential diagnosis  includes diffuse large B-cell lymphoma and high-grade B-cell lymphoma  (double hit). FISH is pending and will be reported in an addendum.   Preliminary results of this case were given to Dr. Lucia Gaskins on August 14, 2019.   Dr. Tresa Moore reviewed the case and agrees with the above diagnosis.   GROSS DESCRIPTION:   The specimen is received in formalin and consists of a 0.9 x 0.4 x 0.4  cm piece of tan soft tissue. The specimen is entirely submitted in 1  cassette. Charles Li 08/14/2019)   Final Diagnosis performed by Thressa Sheller, MD.  Electronically signed  08/14/2019  Technical and / or  Professional components performed at [facility]. [facility], [place] [address], [place], [place] [address].  Immunohistochemistry Technical component (if applicable) was performed  at [facility]. [address], [place],  [place], [place] [address].  IMMUNOHISTOCHEMISTRY DISCLAIMER (if applicable):  Some of these immunohistochemical stains may have been developed and the  performance characteristics determine by [facility]. Some  may not have been cleared or approved by the U.S. Food and Drug  Administration. The FDA has determined that such clearance or approval  is not necessary. This test is used for clinical purposes. It should not  be regarded as investigational or for research. This laboratory is  certified under the [place]  (CLIA-88) as qualified to perform high complexity clinical laboratory  testing. The controls stained appropriately.  SURGICAL PATHOLOGY  ** THIS IS AN ADDENDUM REPORT **  CASE: [id number]  PATIENT: Charles Li  Bone Marrow Report  **Addendum **   Reason for Addendum #1: Cytogenetics results   Clinical History: lymphoma, DLBCL, left posterior iliac, (ADC)    DIAGNOSIS:   BONE MARROW, ASPIRATE, CLOT, CORE:  - Normocellular marrow with trilineage hematopoiesis  - No lymphoma identified   PERIPHERAL BLOOD:  - Morphologically unremarkable  - See complete blood cell count   MICROSCOPIC DESCRIPTION:   PERIPHERAL BLOOD SMEAR: The peripheral blood is morphologically  unremarkable.   BONE MARROW ASPIRATE: Spicular, cellular and adequate for evaluation  Erythroid precursors: Orderly maturation without overt dysplasia  Granulocytic precursors: Orderly maturation without overt dysplasia  Megakaryocytes: Qualitatively and quantitatively unremarkable  Lymphocytes/plasma cells: No lymphocytosis or plasmacytosis   TOUCH PREPARATIONS: Hypocellular with no  additional findings ascompared  to the aspirate smears.   CLOT AND BIOPSY: Examination of the core biopsy and clot section reveals  a normocellular bone marrow (30-40%) with trilineage hematopoiesis.  Myeloid and erythroid elements are present in essentially normal  proportions. Megakaryocytes display a spectrum of maturation without  clustering. There are no granulomas identified. A single well-formed  lymphoid aggregate is noted on the clot section. By  immunohistochemistry, is composed of an admixture of B and T cells  (CD20, CD3) without aberrant B-cell expression of CD5 or CD10.   IRON STAIN: Iron stains are performed on a bone marrow aspirate or touch  imprint smear and section of clot. The controls stained appropriately.     Storage Iron: Present    Ring Sideroblasts:  Not identified   ADDITIONAL DATA/TESTING: Cytogenetics is pending and will be reported  separately. Flow cytometry performed on the bone marrow biopsy did not  identify a monoclonal B-cell or phenotypically aberrant T-cell  population (See [id number]).   CELL COUNT DATA:   Bone Marrow count performed on 500 cells shows:  Blasts:  0%  Myeloid: 57%  Promyelocytes: 0%  Erythroid:   28%  Myelocytes:  10% Lymphocytes:  7%  Metamyelocytes:   2%  Plasma cells: 8%  Bands:  7%  Neutrophils:  33% M:E ratio:   2.03  Eosinophils:  5%  Basophils:   0%  Monocytes:   0%   Lab Data: CBC performed on 09/04/2019 shows:  WBC: 8.6 k/uL Neutrophils:  63%  Hgb: 14.2 g/dL Lymphocytes:  30%  HCT: 41.2 %  Monocytes:   3%  MCV: 94.7 fL  Eosinophils:  4%  RDW: 11.8 %  Basophils:   0%  PLT: 263 k/uL   GROSS DESCRIPTION:   A: Aspirate smear   B: Received in B-plus fixative are tissue fragments measuring 2.1 x 1.0  x 0.3 cm in aggregate. The specimen is submitted in total.   C: Received in B-plus fixative is a 2.0 x 0.2 cm core of bone which is  submitted in toto fine  decalcification. [facility] 09/04/2019)    Final Diagnosis performed by Thressa Sheller, MD.  Electronically signed  09/08/2019  Technical component performed at [place]  [address], [place], [place] [address].  Professional component performed at [employer]. [facility],  [place] [address] [place], [place] [address].  Immunohistochemistry Technical component (if applicable) was performed  at [facility]. [address] [place],  [place], [place] [address].  IMMUNOHISTOCHEMISTRY DISCLAIMER (if applicable):  Some of these immunohistochemical stains may have been developed and the  performance characteristics determine by [facility]. Some  may not have been cleared or approved by the U.S. Food and Drug  Administration. The FDA has determined that such clearance or approval  is not necessary. This test is used for clinical purposes. It should not  be regarded as investigational or for research. This laboratory is  certified under the [place]  (CLIA-88) as qualified to perform high complexity clinical laboratory  testing. The controls stained appropriately.   ADDENDUM:   ** Please note this testing was performed and interpreted by an outside  facility. This addendum is only being added to provide a summary of the  results for report completeness. Please see electronic medical record  for a copy of the full report. **   Karyotype: 47,?XY,?del(?9)?(?q13q22)?,?+?mar[?3]?/?46,?XY[?20]?  Interpretation: ABNORMAL MALE KARYOTYPE   Cytogenetic analysis shows an abnormal male karyotype. 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 The remaining twenty cells show a normal  karyotype.   Deletion of chromosome 9q occurs in both myeloid (more common) and  lymphoid disorders, including acute myeloid leukemia (AML) (more   commonly) and myelodysplastic syndrome (MDS) (only rarely). In MDS the  prognostic significance would be expected to be intermediate.  In AML  the prognosis is variable. Marker chromosomes and structural  chromosomal aberrations in the absence of autosomal monosomies have  minimal prognostic impact. Correlation with other clinical and  laboratory findings is indicated.    RADIOGRAPHIC STUDIES:  No results found.  ASSESSMENT & PLAN Charles Li 79 y.o. male with  medical history significant for left tonsillar diffuse B cell lymphoma presents for f/u.   In the interim since his last visit the patient has been doing quite well.  Appetite is returning and taste is also improving.  He is not having any new or concerning symptoms at this time.  Review of Charles Li's PET CT scan from 11/17/2019 is reassuring with complete resolution of FDG avidity in the involved lymph nodes.   The treatment plan was for Rituximab 375 mg/m2, Cyclophosphamide 741m/m2, etoposide IV 512mm2 (followed by 10043m2 PO on Day 2 and 3), and Vincristine 1.4 mg/m2 all on Day 1. The patient will also received PO prednisone 100m43my 1-5 of each 3 week cycle. Patient recieved ISRT with Dr. SquiIsidore Moosmpleted on 12/23/2019.   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 Bone marrow biopsy confirmed no alternative sites of lymphoma.  -- started R-CEOP chemotherapy on 09/14/2019. This is therapy with curative intent. Etoposide was substituted for anthracycline therapy due to his atrial fibrillation found on TTE.  -- At this time his findings are consistent with a Stage I. Patient completed 3 cycles of R-CEOP with ISRT to site of disease. Interval PET scan on 11/17/2019 showed no active disease. Patient completed radiation therapy on 12/23/2019. --routine follow up imaging is not recommended per NCCN guidelines.  This can be performed as clinically indicated.   --RTC in 3 months for continued post treatment surviellance  #Symptom Management --no longer requires anti-nausea medication. Never needed during chemotherapy.  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 We were agreeable to deferring evaluation to a later time.  --kidney function is stable and he has no urinary/pain symptoms --defer management urology service.   #Atrial Fibrillation --currently following with cardiology, underwent cardioversion on 12/16/2019.  --no bleeding, bruising, or other issues on Eliquis.   No orders of the defined types were placed in this encounter.   All questions were answered. The patient knows to call the clinic with any problems, questions or concerns.  A total of more than 30 minutes were spent on this encounter and over half of that time was spent on counseling and coordination of care as outlined above.   JohnLedell Peoples Department of Hematology/Oncology [facility]: [phone]: [phone]: [email or web address]  04/06/2020 9:01 PM

## 2020-04-07 ENCOUNTER — Other Ambulatory Visit: Payer: Self-pay

## 2020-04-07 ENCOUNTER — Inpatient Hospital Stay: Payer: Medicare HMO

## 2020-04-07 ENCOUNTER — Inpatient Hospital Stay: Payer: Medicare HMO | Attending: Hematology and Oncology | Admitting: Hematology and Oncology

## 2020-04-07 VITALS — BP 171/73 | HR 50 | Temp 97.8°F | Resp 17 | Ht 70.0 in | Wt 174.8 lb

## 2020-04-07 DIAGNOSIS — C8331 Diffuse large B-cell lymphoma, lymph nodes of head, face, and neck: Secondary | ICD-10-CM | POA: Diagnosis present

## 2020-04-07 DIAGNOSIS — N132 Hydronephrosis with renal and ureteral calculous obstruction: Secondary | ICD-10-CM

## 2020-04-07 DIAGNOSIS — Z23 Encounter for immunization: Secondary | ICD-10-CM | POA: Diagnosis not present

## 2020-04-07 LAB — CMP (CANCER CENTER ONLY)
ALT: 13 U/L (ref 0–44)
AST: 16 U/L (ref 15–41)
Albumin: 3.6 g/dL (ref 3.5–5.0)
Alkaline Phosphatase: 67 U/L (ref 38–126)
Anion gap: 7 (ref 5–15)
BUN: 15 mg/dL (ref 8–23)
CO2: 31 mmol/L (ref 22–32)
Calcium: 9.6 mg/dL (ref 8.9–10.3)
Chloride: 102 mmol/L (ref 98–111)
Creatinine: 0.86 mg/dL (ref 0.61–1.24)
GFR, Est AFR Am: >60 mL/min (ref >60)
GFR, Estimated: >60 mL/min (ref >60)
Glucose, Bld: 95 mg/dL (ref 70–99)
Potassium: 3.6 mmol/L (ref 3.5–5.1)
Sodium: 140 mmol/L (ref 135–145)
Total Bilirubin: 0.8 mg/dL (ref 0.3–1.2)
Total Protein: 6.4 g/dL — ABNORMAL LOW (ref 6.5–8.1)

## 2020-04-07 LAB — LACTATE DEHYDROGENASE: LDH: 135 U/L (ref 98–192)

## 2020-04-07 LAB — CBC WITH DIFFERENTIAL (CANCER CENTER ONLY)
Abs Immature Granulocytes: 0.02 10*3/uL (ref 0.00–0.07)
Basophils Absolute: 0 10*3/uL (ref 0.0–0.1)
Basophils Relative: 1 %
Eosinophils Absolute: 0.2 10*3/uL (ref 0.0–0.5)
Eosinophils Relative: 2 %
HCT: 41.1 % (ref 39.0–52.0)
Hemoglobin: 14.1 g/dL (ref 13.0–17.0)
Immature Granulocytes: 0 %
Lymphocytes Relative: 10 %
Lymphs Abs: 0.7 10*3/uL (ref 0.7–4.0)
MCH: 32 pg (ref 26.0–34.0)
MCHC: 34.3 g/dL (ref 30.0–36.0)
MCV: 93.4 fL (ref 80.0–100.0)
Monocytes Absolute: 0.8 10*3/uL (ref 0.1–1.0)
Monocytes Relative: 11 %
Neutro Abs: 5.5 10*3/uL (ref 1.7–7.7)
Neutrophils Relative %: 76 %
Platelet Count: 211 10*3/uL (ref 150–400)
RBC: 4.4 MIL/uL (ref 4.22–5.81)
RDW: 13 % (ref 11.5–15.5)
WBC Count: 7.2 10*3/uL (ref 4.0–10.5)
nRBC: 0 % (ref 0.0–0.2)

## 2020-04-07 MED ORDER — INFLUENZA VAC A&B SA ADJ QUAD 0.5 ML IM PRSY
0.5000 mL | PREFILLED_SYRINGE | Freq: Once | INTRAMUSCULAR | Status: AC
  Administered 2020-04-07: 0.5 mL via INTRAMUSCULAR

## 2020-04-07 MED ORDER — INFLUENZA VAC A&B SA ADJ QUAD 0.5 ML IM PRSY
PREFILLED_SYRINGE | INTRAMUSCULAR | Status: AC
  Filled 2020-04-07: qty 0.5

## 2020-04-07 NOTE — Patient Instructions (Signed)
Pt was given flu vaccine information

## 2020-04-08 ENCOUNTER — Encounter: Payer: Self-pay | Admitting: Hematology and Oncology

## 2020-04-12 ENCOUNTER — Telehealth: Payer: Self-pay | Admitting: Hematology and Oncology

## 2020-04-12 NOTE — Telephone Encounter (Signed)
Scheduled per los. Called and spoke with patient. Confirmed appt 

## 2020-06-20 NOTE — Progress Notes (Signed)
Subjective:   Charles Li is a 79 y.o. male who presents for Medicare Annual/Subsequent preventive examination.  I connected with Madilyn Fireman  today by telephone and verified that I am speaking with the correct person using two identifiers. Location patient: home Location provider: work Persons participating in the virtual visit: patient, provider.   I discussed the limitations, risks, security and privacy concerns of performing an evaluation and management service by telephone and the availability of in person appointments. I also discussed with the patient that there may be a patient responsible charge related to this service. The patient expressed understanding and verbally consented to this telephonic visit.    Interactive audio and video telecommunications were attempted between this provider and patient, however failed, due to patient having technical difficulties OR patient did not have access to video capability.  We continued and completed visit with audio only.      Review of Systems    N/A  Cardiac Risk Factors include: advanced age (>43men, >70 women);male gender;dyslipidemia;hypertension     Objective:    Today's Vitals   There is no height or weight on file to calculate BMI.  Advanced Directives 06/21/2020 01/25/2020 01/08/2020 12/16/2019 11/24/2019 10/21/2019 10/05/2019  Does Patient Have a Medical Advance Directive? Yes Yes Yes No No Yes No  Type of Paramedic of [place];Living will Living will Living will - - Living will -  Does patient want to make changes to medical advance directive? No - Patient declined No - Patient declined No - Patient declined - - No - Patient declined No - Patient declined  Copy of [place] in Chart? No - copy requested - - - - - -  Would patient like information on creating a medical advance directive? - - - No - Patient declined - No - Patient declined No - Patient declined  Pre-existing out  of facility DNR order (yellow form or pink MOST form) - - - - - - -    Current Medications (verified) Outpatient Encounter Medications as of 06/21/2020  Medication Sig  . amLODipine (NORVASC) 2.5 MG tablet Take 1 tablet (2.5 mg total) by mouth daily.  Marland Kitchen apixaban (ELIQUIS) 5 MG TABS tablet Take 1 tablet (5 mg total) by mouth 2 (two) times daily.  . benazepril-hydrochlorthiazide (LOTENSIN HCT) 20-12.5 MG tablet Take 1 tablet by mouth daily.  . dorzolamide-timolol (COSOPT) 22.3-6.8 MG/ML ophthalmic solution Place 1 drop into both eyes 2 (two) times daily.  . Multiple Vitamin (MULTIVITAMIN WITH MINERALS) TABS tablet Take 1 tablet by mouth daily.  . Netarsudil-Latanoprost (ROCKLATAN) 0.02-0.005 % SOLN Place 1 drop into both eyes at bedtime.   . pravastatin (PRAVACHOL) 40 MG tablet Take 1 tablet (40 mg total) by mouth daily. Please schedule an appointment for further refills. [phone] (Patient taking differently: Take 40 mg by mouth every evening. Please schedule an appointment for further refills. [phone])   No facility-administered encounter medications on file as of 06/21/2020.    Allergies (verified) Dorzolamide, Acetazolamide, Bimatoprost, Brimonidine, Sulfa antibiotics, Sulfamethoxazole, Dorzolamide hcl-timolol mal, Erythromycin, Penicillins, and Sulfonamide derivatives   History: Past Medical History:  Diagnosis Date  . Abdominal pain, epigastric 07/14/2009  . Abdominal wall hernia   . ALLERGIC RHINITIS 12/12/2006  . ASTHMA 11/28/2007  . 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 Complication of anesthesia    Anxiety with gas  . Diffuse large B cell lymphoma ([place])   . GLAUCOMA 12/12/2006  . 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 HERNIA, VENTRAL 01/04/2009  .  HYPERLIPIDEMIA 11/28/2007  . HYPERTENSION 12/12/2006  . Leg swelling 04-24-12   occ., none at present  . Persistent atrial fibrillation ([place])   . RENAL CALCULUS 03/24/2010  . VENOUS INSUFFICIENCY 01/18/2010  . Visual disturbance 04-24-12    legally blind   Past Surgical History:  Procedure Laterality Date  . CARDIOVERSION N/A 12/16/2019   Procedure: CARDIOVERSION;  Surgeon: [name], MD;  Location: [facility];  Service: Cardiovascular;  Laterality: N/A;  . CATARACT EXTRACTION  2009 - approximate   bilat & glaucoma surgery (7 ophth surgeries)  . INGUINAL HERNIA REPAIR  05/02/2012   Procedure: HERNIA REPAIR INGUINAL ADULT BILATERAL;  Surgeon: Shann Medal, MD;  Location: [facility];  Service: General;  Laterality: N/A;  . IR IMAGING GUIDED PORT INSERTION  09/07/2019  . IR REMOVAL TUN ACCESS W/ PORT W/O FL MOD SED  01/25/2020  . VASECTOMY  04-24-12  . VENTRAL HERNIA REPAIR  05/02/2012   Procedure: LAPAROSCOPIC VENTRAL HERNIA;  Surgeon: Shann Medal, MD;  Location: [facility];  Service: General;  Laterality: N/A;   Family History  Problem Relation Age of Onset  . Cancer Mother   . Pneumonia Father   . Heart disease Brother        congen heart disease  . Birth defects Paternal Aunt        lung  . Colon cancer Neg Hx   . Stomach cancer Neg Hx    Social History   Socioeconomic History  . Marital status: Married    Spouse name: Not on file  . Number of children: Not on file  . Years of education: Not on file  . Highest education level: Not on file  Occupational History  . Not on file  Tobacco Use  . Smoking status: Former Smoker    Packs/day: 1.50    Years: 14.00    Pack years: 21.00    Types: Cigarettes    Quit date: 11/06/1969    Years since quitting: 50.6  . Smokeless tobacco: Never Used  Vaping Use  . Vaping Use: Never used  Substance and Sexual Activity  . Alcohol use: Yes    Alcohol/week: 14.0 standard drinks    Types: 14 Glasses of wine per week    Comment: wine & beer - 3 glasses daily; Waits 10 days after chemo treatment  . Drug use: No  . Sexual activity: Yes  Other Topics Concern  . Not on file  Social History Narrative   Lives in [place] with wife.      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 Difficulty of Paying Living Expenses: Not hard at all  Food Insecurity: No Food Insecurity  . Worried About Charity fundraiser in the Last Year: Never true  . Ran Out of Food in the Last Year: Never true  Transportation Needs: No Transportation Needs  . Lack of Transportation (Medical): No  . Lack of Transportation (Non-Medical): No  Physical Activity: Inactive  . Days of Exercise per Week: 0 days  . Minutes of Exercise per Session: 0 min  Stress: No Stress Concern Present  . Feeling of Stress : Not at all  Social Connections: Moderately Isolated  . Frequency of Communication with Friends and Family: Twice a week  . Frequency of Social Gatherings with Friends and Family: Once a week  . Attends Religious Services: Never  . Active Member of Clubs  or Organizations: No  . Attends Archivist Meetings: Never  . Marital Status: Married    Tobacco Counseling Counseling given: Not Answered   Clinical Intake:  Pre-visit preparation completed: Yes  Pain : No/denies pain     Nutritional Risks: None Diabetes: No  How often do you need to have someone help you when you read instructions, pamphlets, or other written materials from your doctor or pharmacy?: 5 - Always (visiual provider) What is the last grade level you completed in school?: Some years College  Diabetic?No   Interpreter Needed?: No  Information entered by :: [place] of Daily Living In your present state of health, do you have any difficulty performing the following activities: 06/21/2020 01/25/2020  Hearing? - N  Vision? Y N  Comment legally blind -  Difficulty concentrating or making decisions? - N  Walking or climbing stairs? N N  Dressing or bathing? N N  Doing errands, shopping? Y -  Conservation officer, nature and eating ? N -  Using the Toilet? N -  In the past six months, have you accidently leaked  urine? N -  Do you have problems with loss of bowel control? N -  Managing your Medications? N -  Managing your Finances? N -  Housekeeping or managing your Housekeeping? N -  Some recent data might be hidden    Patient Care Team: Eulas Post, MD as PCP - General Ladene Artist, MD as Consulting Physician (Gastroenterology) Earnie Larsson, [facility] as Pharmacist (Pharmacist) [place], Stephani Police, RN as Oncology Nurse Navigator (Oncology) Eppie Gibson, MD as Attending Physician (Radiation Oncology) Orson Slick, MD as Consulting Physician (Hematology and Oncology)  Indicate any recent Medical Services you may have received from other than [facility] providers in the past year (date may be approximate).     Assessment:   This is a routine wellness examination for [place].  Hearing/Vision screen  Hearing Screening   125Hz  250Hz  500Hz  1000Hz  2000Hz  3000Hz  4000Hz  6000Hz  8000Hz   Right ear:           Left ear:           Vision Screening Comments: Patient states last appointment was in July has discontinued seeing eye doctor   Dietary issues and exercise activities discussed: Current Exercise Habits: The patient does not participate in regular exercise at present, Exercise limited by: None identified  Goals    . Exercise 3x per week (30 min per time)      Depression Screen PHQ 2/9 Scores 06/21/2020 05/21/2017 04/15/2014 01/13/2013  PHQ - 2 Score 0 0 0 0  PHQ- 9 Score 0 - - -    Fall Risk Fall Risk  06/21/2020 05/21/2017 04/15/2014 01/13/2013  Falls in the past year? 0 No No No  Number falls in past yr: 0 - - -  Injury with Fall? 0 - - -  Risk for fall due to : Impaired vision - - -  Follow up Falls evaluation completed;Falls prevention discussed - - -    FALL RISK PREVENTION PERTAINING TO THE HOME:  Any stairs in or around the home? Yes  If so, are there any without handrails? No  Home free of loose throw rugs in walkways, pet beds, electrical cords, etc? Yes   Adequate lighting in your home to reduce risk of falls? Yes   ASSISTIVE DEVICES UTILIZED TO PREVENT FALLS:  Life alert? No  Use of a cane, walker or w/c? Yes  Grab bars in  the bathroom? No  Shower chair or bench in shower? No  Elevated toilet seat or a handicapped toilet? No    Cognitive Function:   Normal cognitive status assessed by direct observation by this Nurse Health Advisor. No abnormalities found.        Immunizations Immunization History  Administered Date(s) Administered  . Fluad Quad(high Dose 65+) 03/30/2019, 04/07/2020  . Influenza Whole 06/08/2004, 06/14/2009, 03/24/2010  . Influenza, High Dose Seasonal PF 05/10/2017, 06/09/2018  . Influenza,inj,Quad PF,6+ Mos 05/07/2014  . Moderna Sars-Covid-2 Vaccination 09/02/2019, 09/30/2019  . Pneumococcal Conjugate-13 03/02/2015  . Td 07/09/2000  . Tdap 03/02/2015  . Zoster 06/29/2010    TDAP status: Up to date  Flu Vaccine status: Up to date  Pneumococcal vaccine status: Due, Education has been provided regarding the importance of this vaccine. Advised may receive this vaccine at local pharmacy or Health Dept. Aware to provide a copy of the vaccination record if obtained from local pharmacy or Health Dept. Verbalized acceptance and understanding.  Covid-19 vaccine status: Completed vaccines  Qualifies for Shingles Vaccine? Yes   Zostavax completed Yes   Shingrix Completed?: No.    Education has been provided regarding the importance of this vaccine. Patient has been advised to call insurance company to determine out of pocket expense if they have not yet received this vaccine. Advised may also receive vaccine at local pharmacy or Health Dept. Verbalized acceptance and understanding.  Screening Tests Health Maintenance  Topic Date Due  . Hepatitis C Screening  Never done  . PNA vac Low Risk Adult (2 of 2 - PPSV23) 03/01/2016  . COLONOSCOPY  08/06/2018  . COVID-19 Vaccine (3 - Moderna risk 4-dose series)  10/28/2019  . TETANUS/TDAP  03/01/2025  . INFLUENZA VACCINE  Completed    Health Maintenance  Health Maintenance Due  Topic Date Due  . Hepatitis C Screening  Never done  . PNA vac Low Risk Adult (2 of 2 - PPSV23) 03/01/2016  . COLONOSCOPY  08/06/2018  . COVID-19 Vaccine (3 - Moderna risk 4-dose series) 10/28/2019    Colorectal cancer screening: Referral to GI placed 06/21/2020. Pt aware the office will call re: appt.  Lung Cancer Screening: (Low Dose CT Chest recommended if Age 52-80 years, 30 pack-year currently smoking OR have quit w/in 15years.) does not qualify.   Lung Cancer Screening Referral: N/A   Additional Screening:  Hepatitis C Screening: does qualify;   Vision Screening: Recommended annual ophthalmology exams for early detection of glaucoma and other disorders of the eye. Is the patient up to date with their annual eye exam?  No  Who is the provider or what is the name of the office in which the patient attends annual eye exams? Patient is legally blind  If pt is not established with a provider, would they like to be referred to a provider to establish care? No .   Dental Screening: Recommended annual dental exams for proper oral hygiene  Community Resource Referral / Chronic Care Management: CRR required this visit?  No   CCM required this visit?  No      Plan:     I have personally reviewed and noted the following in the patient's chart:   . Medical and social history . Use of alcohol, tobacco or illicit drugs  . Current medications and supplements . Functional ability and status . Nutritional status . Physical activity . Advanced directives . List of other physicians . Hospitalizations, surgeries, and ER visits in previous 12 months .  Vitals . Screenings to include cognitive, depression, and falls . Referrals and appointments  In addition, I have reviewed and discussed with patient certain preventive protocols, quality metrics, and best  practice recommendations. A written personalized care plan for preventive services as well as general preventive health recommendations were provided to patient.     Ofilia Neas, LPN   [phone]   Nurse Notes: None

## 2020-06-21 ENCOUNTER — Ambulatory Visit (INDEPENDENT_AMBULATORY_CARE_PROVIDER_SITE_OTHER): Payer: Medicare HMO

## 2020-06-21 ENCOUNTER — Other Ambulatory Visit: Payer: Self-pay

## 2020-06-21 DIAGNOSIS — Z0000 Encounter for general adult medical examination without abnormal findings: Secondary | ICD-10-CM | POA: Diagnosis not present

## 2020-06-21 NOTE — Patient Instructions (Signed)
Charles Li , Thank you for taking time to come for your Medicare Wellness Visit. I appreciate your ongoing commitment to your health goals. Please review the following plan we discussed and let me know if I can assist you in the future.   Screening recommendations/referrals: Colonoscopy: Patient declined  Recommended yearly ophthalmology/optometry visit for glaucoma screening and checkup Recommended yearly dental visit for hygiene and checkup  Vaccinations: Influenza vaccine: Up to date, next due fall 2022  Pneumococcal vaccine: Currently due for Pneumovax 23, you may receive at your next office visit  Tdap vaccine: Up to date, next due 03/01/2025 Shingles vaccine: Currently due for Shingrix, if you wish to receive we recommend that you do so at your local pharmacy.     Advanced directives: Please bring copies of your advanced medical directives so that we may scan them into your chart.    Conditions/risks identified: Please try to incorporate some physical activity into your daily routine   Next appointment: None  Preventive Care 65 Years and Older, Male Preventive care refers to lifestyle choices and visits with your health care provider that can promote health and wellness. What does preventive care include?  A yearly physical exam. This is also called an annual well check.  Dental exams once or twice a year.  Routine eye exams. Ask your health care provider how often you should have your eyes checked.  Personal lifestyle choices, including:  Daily care of your teeth and gums.  Regular physical activity.  Eating a healthy diet.  Avoiding tobacco and drug use.  Limiting alcohol use.  Practicing safe sex.  Taking low doses of aspirin every day.  Taking vitamin and mineral supplements as recommended by your health care provider. What happens during an annual well check? The services and screenings done by your health care provider during your annual well check will  depend on your age, overall health, lifestyle risk factors, and family history of disease. Counseling  Your health care provider may ask you questions about your:  Alcohol use.  Tobacco use.  Drug use.  Emotional well-being.  Home and relationship well-being.  Sexual activity.  Eating habits.  History of falls.  Memory and ability to understand (cognition).  Work and work Statistician. Screening  You may have the following tests or measurements:  Height, weight, and BMI.  Blood pressure.  Lipid and cholesterol levels. These may be checked every 5 years, or more frequently if you are over 31 years old.  Skin check.  Lung cancer screening. You may have this screening every year starting at age 34 if you have a 30-pack-year history of smoking and currently smoke or have quit within the past 15 years.  Fecal occult blood test (FOBT) of the stool. You may have this test every year starting at age 9.  Flexible sigmoidoscopy or colonoscopy. You may have a sigmoidoscopy every 5 years or a colonoscopy every 10 years starting at age 73.  Prostate cancer screening. Recommendations will vary depending on your family history and other risks.  Hepatitis C blood test.  Hepatitis B blood test.  Sexually transmitted disease (STD) testing.  Diabetes screening. This is done by checking your blood sugar (glucose) after you have not eaten for a while (fasting). You may have this done every 1-3 years.  Abdominal aortic aneurysm (AAA) screening. You may need this if you are a current or former smoker.  Osteoporosis. You may be screened starting at age 64 if you are at high risk. Talk  with your health care provider about your test results, treatment options, and if necessary, the need for more tests. Vaccines  Your health care provider may recommend certain vaccines, such as:  Influenza vaccine. This is recommended every year.  Tetanus, diphtheria, and acellular pertussis (Tdap,  Td) vaccine. You may need a Td booster every 10 years.  Zoster vaccine. You may need this after age 75.  Pneumococcal 13-valent conjugate (PCV13) vaccine. One dose is recommended after age 69.  Pneumococcal polysaccharide (PPSV23) vaccine. One dose is recommended after age 27. Talk to your health care provider about which screenings and vaccines you need and how often you need them. This information is not intended to replace advice given to you by your health care provider. Make sure you discuss any questions you have with your health care provider. Document Released: 07/22/2015 Document Revised: 03/14/2016 Document Reviewed: 04/26/2015 Elsevier Interactive Patient Education  2017 [employer] Prevention in the Home Falls can cause injuries. They can happen to people of all ages. There are many things you can do to make your home safe and to help prevent falls. What can I do on the outside of my home?  Regularly fix the edges of walkways and driveways and fix any cracks.  Remove anything that might make you trip as you walk through a door, such as a raised step or threshold.  Trim any bushes or trees on the path to your home.  Use bright outdoor lighting.  Clear any walking paths of anything that might make someone trip, such as rocks or tools.  Regularly check to see if handrails are loose or broken. Make sure that both sides of any steps have handrails.  Any raised decks and porches should have guardrails on the edges.  Have any leaves, snow, or ice cleared regularly.  Use sand or salt on walking paths during winter.  Clean up any spills in your garage right away. This includes oil or grease spills. What can I do in the bathroom?  Use night lights.  Install grab bars by the toilet and in the tub and shower. Do not use towel bars as grab bars.  Use non-skid mats or decals in the tub or shower.  If you need to sit down in the shower, use a plastic, non-slip  stool.  Keep the floor dry. Clean up any water that spills on the floor as soon as it happens.  Remove soap buildup in the tub or shower regularly.  Attach bath mats securely with double-sided non-slip rug tape.  Do not have throw rugs and other things on the floor that can make you trip. What can I do in the bedroom?  Use night lights.  Make sure that you have a light by your bed that is easy to reach.  Do not use any sheets or blankets that are too big for your bed. They should not hang down onto the floor.  Have a firm chair that has side arms. You can use this for support while you get dressed.  Do not have throw rugs and other things on the floor that can make you trip. What can I do in the kitchen?  Clean up any spills right away.  Avoid walking on wet floors.  Keep items that you use a lot in easy-to-reach places.  If you need to reach something above you, use a strong step stool that has a grab bar.  Keep electrical cords out of the way.  Do  not use floor polish or wax that makes floors slippery. If you must use wax, use non-skid floor wax.  Do not have throw rugs and other things on the floor that can make you trip. What can I do with my stairs?  Do not leave any items on the stairs.  Make sure that there are handrails on both sides of the stairs and use them. Fix handrails that are broken or loose. Make sure that handrails are as long as the stairways.  Check any carpeting to make sure that it is firmly attached to the stairs. Fix any carpet that is loose or worn.  Avoid having throw rugs at the top or bottom of the stairs. If you do have throw rugs, attach them to the floor with carpet tape.  Make sure that you have a light switch at the top of the stairs and the bottom of the stairs. If you do not have them, ask someone to add them for you. What else can I do to help prevent falls?  Wear shoes that:  Do not have high heels.  Have rubber bottoms.  Are  comfortable and fit you well.  Are closed at the toe. Do not wear sandals.  If you use a stepladder:  Make sure that it is fully opened. Do not climb a closed stepladder.  Make sure that both sides of the stepladder are locked into place.  Ask someone to hold it for you, if possible.  Clearly mark and make sure that you can see:  Any grab bars or handrails.  First and last steps.  Where the edge of each step is.  Use tools that help you move around (mobility aids) if they are needed. These include:  Canes.  Walkers.  Scooters.  Crutches.  Turn on the lights when you go into a dark area. Replace any light bulbs as soon as they burn out.  Set up your furniture so you have a clear path. Avoid moving your furniture around.  If any of your floors are uneven, fix them.  If there are any pets around you, be aware of where they are.  Review your medicines with your doctor. Some medicines can make you feel dizzy. This can increase your chance of falling. Ask your doctor what other things that you can do to help prevent falls. This information is not intended to replace advice given to you by your health care provider. Make sure you discuss any questions you have with your health care provider. Document Released: 04/21/2009 Document Revised: 12/01/2015 Document Reviewed: 07/30/2014 Elsevier Interactive Patient Education  2017 [employer].

## 2020-07-05 ENCOUNTER — Telehealth: Payer: Self-pay | Admitting: *Deleted

## 2020-07-05 NOTE — Telephone Encounter (Signed)
Patient called. Wife provides transportation to his appts as he does not drive and wife has broken her leg. He is arranging to have someone bring him to the [facility] and does not want them to have to wait on him. He wanted to know if he could get labs this week and change appt on 1/3 to phone appt.  Offered patient ride through [employer] Patient in agreement for service. Contacted them, advised by Mo to complete request form and send to them. They will contact patient.  Emailed Acup[employer]ist request form to [employer] requesting . Advised patient that [employer] will contact him to arrange ride. Patient verbalized understanding.

## 2020-07-06 ENCOUNTER — Telehealth: Payer: Self-pay | Admitting: Family Medicine

## 2020-07-06 NOTE — Telephone Encounter (Signed)
   CHAPMAN MATTEUCCI DOB: [birth year] MRN: [id number]   RIDER WAIVER AND RELEASE OF LIABILITY  For purposes of improving physical access to our facilities, [facility] is pleased to partner with third parties to provide [facility] patients or other authorized individuals the option of convenient, on-demand ground transportation services (the Chiropractor") through use of the technology service that enables users to request on-demand ground transportation from independent third-party providers.  By opting to use and accept these [employer], I, the undersigned, hereby agree on behalf of myself, and on behalf of any minor child using the [employer] for whom I am the parent or legal guardian, as follows:  1. Science writer provided to me are provided by independent third-party transportation providers who are not [employer] or employees and who are unaffiliated with [employer]. 2. [place] [facility] is neither a transportation carrier nor a common or public carrier. 3. [facility] has no control over the quality or safety of the transportation that occurs as a result of the [employer]. 4. [facility] cannot guarantee that any third-party transportation provider will complete any arranged transportation service. 5. [facility] makes no representation, warranty, or guarantee regarding the reliability, timeliness, quality, safety, suitability, or availability of any of the Transport Services or that they will be error free. 6. I fully understand that traveling by vehicle involves risks and dangers of serious bodily injury, including permanent disability, paralysis, and death. I agree, on behalf of myself and on behalf of any minor child using the Transport Services for whom I am the parent or legal guardian, that the entire risk arising out of my use of the [employer] remains solely with me, to the maximum extent permitted under applicable law. 7. The [employer] are provided "as is" and "as available." [facility] disclaims all representations and warranties, express, implied or statutory, not expressly set out in these terms, including the implied warranties of merchantability and fitness for a particular purpose. 8. I hereby waive and release [facility], its agents, employees, officers, directors, representatives, insurers, attorneys, assigns, successors, subsidiaries, and affiliates from any and all past, present, or future claims, demands, liabilities, actions, causes of action, or suits of any kind directly or indirectly arising from acceptance and use of the [employer]. 9. I further waive and release [place] and its affiliates from all present and future liability and responsibility for any injury or death to persons or damages to property caused by or related to the use of the [employer]. 10. I have read this Waiver and Release of Liability, and I understand the terms used in it and their legal significance. This Waiver is freely and voluntarily given with the understanding that my right (as well as the right of any minor child for whom I am the parent or legal guardian using the [employer]) to legal recourse against [facility] in connection with the [employer] is knowingly surrendered in return for use of these services.   I attest that I read the consent document to Christy Sartorius, gave Mr. Speckman the opportunity to ask questions and answered the questions asked (if any). I affirm that Christy Sartorius then provided consent for he's participation in this program.     Launa Grill

## 2020-07-10 NOTE — Progress Notes (Signed)
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 Lucia Gaskins for tonsillar mass present for a few weeks. 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 FISH studies pending.  2) 08/17/2019: establish care with Dr. Lorenso Courier   3) 08/28/2019: PET CT scan reveals activity in left lingual tonsil, level 2 lymph node, and level 3 lymph node. Consistent with Stage I 4) 09/04/2019: Bone marrow biopsy shows no evidence of lymphoma in the bone marrow.  5) 09/04/2019; TTE showed EF 60-65%, however patient found to have new atrial fibrillation.  6) 09/14/2019: started R-CEOP chemotherapy, Cycle 1 Day 1 7) 10/05/2019: R-CEOP chemotherapy, Cycle 2 Day 1 8) 10/26/2019: R-CEOP chemotherapy Cycle 3 Day 1 9) 11/30/2019- 12/23/2019: Radiation therapy with Dr. Isidore Moos  HISTORY OF PRESENTING ILLNESS:  Charles Li 80 y.o. male with medical history significant for left tonsillar B cell lymphoma presents for f/u. He was last seen on 04/07/2020. In the interim he has had no major changes in health, hospitalizations, or ED visits.   On exam today Charles Li notes he has been well in the interim since his last visit in September.  He reports his appetite is good and he is gaining some of his weight back.  He notes that his energy levels have been good and he has had no issues with pain, lumps, bumps, or sore throat.  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  Unfortunately his wife broke her leg and she was unable to be with him here today.  He notes that he has to stay strong in order to take care of her.  He had no additional questions concerns or complaints today.   He otherwise denies having issues with fevers, chills, sweats, nausea, vomiting or diarrhea.  He denies having any new lymphadenopathy, weakness, shortness of breath, or fatigue.  A full 10 point ROS is listed below.  MEDICAL HISTORY:  Past Medical History:  Diagnosis Date  . Abdominal pain, epigastric 07/14/2009  . Abdominal wall hernia   . ALLERGIC RHINITIS 12/12/2006  . ASTHMA 11/28/2007  . 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 Complication of anesthesia    Anxiety with gas  . Diffuse large B cell lymphoma ([place])   . GLAUCOMA 12/12/2006  . 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 HERNIA, VENTRAL 01/04/2009  . HYPERLIPIDEMIA 11/28/2007  . HYPERTENSION 12/12/2006  . Leg swelling 04-24-12   occ., none at present  . Persistent atrial fibrillation ([place])   . RENAL CALCULUS 03/24/2010  . VENOUS INSUFFICIENCY 01/18/2010  . Visual disturbance 04-24-12   legally blind    ALLERGIES:  is allergic to dorzolamide, acetazolamide, bimatoprost, brimonidine, sulfa antibiotics, sulfamethoxazole, dorzolamide hcl-timolol mal, erythromycin, penicillins, and sulfonamide derivatives.  MEDICATIONS:  Current Outpatient Medications  Medication Sig Dispense Refill  . amLODipine (NORVASC) 2.5 MG tablet Take 1 tablet (2.5 mg total) by mouth daily. 90 tablet 1  . apixaban (ELIQUIS) 5 MG TABS tablet Take 1 tablet (5 mg total) by mouth 2 (two) times daily.  180 tablet 3  . benazepril-hydrochlorthiazide (LOTENSIN HCT) 20-12.5 MG tablet Take 1 tablet by mouth daily. 30 tablet 0  . dorzolamide-timolol (COSOPT) 22.3-6.8 MG/ML ophthalmic solution Place 1 drop into both eyes 2 (two) times daily.    . Multiple Vitamin (MULTIVITAMIN WITH MINERALS) TABS tablet Take 1 tablet by mouth daily.    .  Netarsudil-Latanoprost (ROCKLATAN) 0.02-0.005 % SOLN Place 1 drop into both eyes at bedtime.     . pravastatin (PRAVACHOL) 40 MG tablet Take 1 tablet (40 mg total) by mouth daily. Please schedule an appointment for further refills. [phone] (Patient taking differently: Take 40 mg by mouth every evening. Please schedule an appointment for further refills. [phone]) 90 tablet 3   No current facility-administered medications for this visit.    REVIEW OF SYSTEMS:   Constitutional: ( - ) fevers, ( - )  chills , ( - ) night sweats (+) hair loss Eyes: ( - ) blurriness of vision, ( - ) double vision, ( - ) watery eyes Ears, nose, mouth, throat, and face: ( - ) mucositis, ( + ) sore throat Respiratory: ( - ) cough, ( - ) dyspnea, ( - ) wheezes Cardiovascular: ( - ) palpitation, ( - ) chest discomfort, ( - ) lower extremity swelling Gastrointestinal:  ( - ) nausea, ( - ) heartburn, ( - ) change in bowel habits Skin: ( - ) abnormal skin rashes Lymphatics: ( - ) new lymphadenopathy, ( - ) easy bruising Neurological: ( - ) numbness, ( - ) tingling, ( - ) new weaknesses Behavioral/Psych: ( - ) mood change, ( - ) new changes  All other systems were reviewed with the patient and are negative.  PHYSICAL EXAMINATION: ECOG PERFORMANCE STATUS: 1 - Symptomatic but completely ambulatory  Vitals:   07/11/20 1049  BP: (!) 153/78  Pulse: (!) 55  Resp: 18  Temp: 98.3 F (36.8 C)  SpO2: 98%   Filed Weights   07/11/20 1049  Weight: 182 lb (82.6 kg)    GENERAL: well appearing elderly Caucasian male in NAD SKIN: skin color, texture, turgor are normal, no rashes or significant lesions.  OROPHARYNX: no swelling, erythema, enlarged tonsils or new lesions.  EYES: conjunctiva are pink and non-injected, sclera clear LUNGS: clear to auscultation and percussion with normal breathing effort HEART: regular rate & rhythm and no murmurs and no lower extremity edema Musculoskeletal: no cyanosis of digits and  no clubbing  PSYCH: alert & oriented x 3, fluent speech NEURO: no focal motor/sensory deficits  LABORATORY DATA:  I have reviewed the data as listed CBC Latest Ref Rng & Units 07/11/2020 04/07/2020 01/25/2020  WBC 4.0 - 10.5 K/uL 7.4 7.2 6.5  Hemoglobin 13.0 - 17.0 g/dL 14.3 14.1 14.7  Hematocrit 39.0 - 52.0 % 41.3 41.1 43.4  Platelets 150 - 400 K/uL 210 211 195    CMP Latest Ref Rng & Units 07/11/2020 04/07/2020 01/07/2020  Glucose 70 - 99 mg/dL 107(H) 95 144(H)  BUN 8 - 23 mg/dL 16 15 13   Creatinine 0.61 - 1.24 mg/dL 0.97 0.86 0.87  Sodium 135 - 145 mmol/L 140 140 141  Potassium 3.5 - 5.1 mmol/L 4.2 3.6 3.7  Chloride 98 - 111 mmol/L 104 102 104  CO2 22 - 32 mmol/L 32 31 29  Calcium 8.9 - 10.3 mg/dL 9.6 9.6 9.2  Total Protein 6.5 - 8.1 g/dL 6.5 6.4(L) 6.0(L)  Total Bilirubin 0.3 - 1.2 mg/dL 0.8 0.8 0.6  Alkaline Phos 38 - 126 U/L 58 67 56  AST 15 - 41 U/L 14(L) 16 16  ALT 0 - 44 U/L 14 13 14     PATHOLOGY:  SURGICAL PATHOLOGY  CASE: [id number]  PATIENT: Charles Li  Surgical Pathology Report   Clinical History: tonsil cancer (cm)   FINAL MICROSCOPIC DIAGNOSIS:   A. TONSIL, LEFT, BIOPSY:  - Large B-cell lymphoma  - See comment   COMMENT:  The tonsillar tissue has an ulcerated surface with underlying atypical  cellular proliferation. By immunohistochemistry, the neoplastic cells  are positive for CD20, CD10, BCL6 and BCL2 (weak) but negative for CD3,  CD56, CD5, Mum-1, cytokeratin AE1/3 and EBV by in situ hybridization.  The proliferative rate by Ki-67 is 70 to 80%. Overall, the features are  consistent with a large B-cell lymphoma. The differential diagnosis  includes diffuse large B-cell lymphoma and high-grade B-cell lymphoma  (double hit). FISH is pending and will be reported in an addendum.   Preliminary results of this case were given to Dr. Lucia Gaskins on August 14, 2019.   Dr. Tresa Moore reviewed the case and agrees with the above diagnosis.   GROSS  DESCRIPTION:   The specimen is received in formalin and consists of a 0.9 x 0.4 x 0.4  cm piece of tan soft tissue. The specimen is entirely submitted in 1  cassette. Craig Staggers 08/14/2019)   Final Diagnosis performed by Thressa Sheller, MD.  Electronically signed  08/14/2019  Technical and / or Professional components performed at [facility]. [facility], [place] [address] [place], [place] [address].  Immunohistochemistry Technical component (if applicable) was performed  at [facility]. [address], [place],  [place], [place] [address].  IMMUNOHISTOCHEMISTRY DISCLAIMER (if applicable):  Some of these immunohistochemical stains may have been developed and the  performance characteristics determine by [facility]. Some  may not have been cleared or approved by the U.S. Food and Drug  Administration. The FDA has determined that such clearance or approval  is not necessary. This test is used for clinical purposes. It should not  be regarded as investigational or for research. This laboratory is  certified under the [place]  (CLIA-88) as qualified to perform high complexity clinical laboratory  testing. The controls stained appropriately.  SURGICAL PATHOLOGY  ** THIS IS AN ADDENDUM REPORT **  CASE: [id number]  PATIENT: Charles Li  Bone Marrow Report  **Addendum **   Reason for Addendum #1: Cytogenetics results   Clinical History: lymphoma, DLBCL, left posterior iliac, (ADC)    DIAGNOSIS:   BONE MARROW, ASPIRATE, CLOT, CORE:  - Normocellular marrow with trilineage hematopoiesis  - No lymphoma identified   PERIPHERAL BLOOD:  - Morphologically unremarkable  - See complete blood cell count   MICROSCOPIC DESCRIPTION:   PERIPHERAL BLOOD SMEAR: The peripheral blood is morphologically  unremarkable.   BONE MARROW ASPIRATE: Spicular, cellular and adequate for evaluation  Erythroid precursors:  Orderly maturation without overt dysplasia  Granulocytic precursors: Orderly maturation without overt dysplasia  Megakaryocytes: Qualitatively and quantitatively unremarkable  Lymphocytes/plasma cells: No lymphocytosis or plasmacytosis   TOUCH PREPARATIONS: Hypocellular with no additional findings ascompared  to the aspirate smears.   CLOT AND BIOPSY: Examination of the core biopsy and clot section reveals  a normocellular bone marrow (30-40%) with trilineage hematopoiesis.  Myeloid and erythroid elements are present in essentially normal  proportions. Megakaryocytes display a spectrum of maturation without  clustering. There are no granulomas identified. A single well-formed  lymphoid aggregate is noted on the  clot section. By  immunohistochemistry, is composed of an admixture of B and T cells  (CD20, CD3) without aberrant B-cell expression of CD5 or CD10.   IRON STAIN: Iron stains are performed on a bone marrow aspirate or touch  imprint smear and section of clot. The controls stained appropriately.     Storage Iron: Present    Ring Sideroblasts: Not identified   ADDITIONAL DATA/TESTING: Cytogenetics is pending and will be reported  separately. Flow cytometry performed on the bone marrow biopsy did not  identify a monoclonal B-cell or phenotypically aberrant T-cell  population (See [id number]).   CELL COUNT DATA:   Bone Marrow count performed on 500 cells shows:  Blasts:  0%  Myeloid: 57%  Promyelocytes: 0%  Erythroid:   28%  Myelocytes:  10% Lymphocytes:  7%  Metamyelocytes:   2%  Plasma cells: 8%  Bands:  7%  Neutrophils:  33% M:E ratio:   2.03  Eosinophils:  5%  Basophils:   0%  Monocytes:   0%   Lab Data: CBC performed on 09/04/2019 shows:  WBC: 8.6 k/uL Neutrophils:  63%  Hgb: 14.2 g/dL Lymphocytes:  30%  HCT: 41.2 %  Monocytes:   3%  MCV: 94.7 fL  Eosinophils:  4%  RDW: 11.8 %  Basophils:   0%  PLT: 263 k/uL    GROSS DESCRIPTION:   A: Aspirate smear   B: Received in B-plus fixative are tissue fragments measuring 2.1 x 1.0  x 0.3 cm in aggregate. The specimen is submitted in total.   C: Received in B-plus fixative is a 2.0 x 0.2 cm core of bone which is  submitted in toto fine decalcification. [facility] 09/04/2019)    Final Diagnosis performed by Thressa Sheller, MD.  Electronically signed  09/08/2019  Technical component performed at [facility]  [address] [place], [place] [address].  Professional component performed at [employer]. [facility],  [place] [address], [place], [place] [address].  Immunohistochemistry Technical component (if applicable) was performed  at [facility]. [address], [place],  [place],  [address].  IMMUNOHISTOCHEMISTRY DISCLAIMER (if applicable):  Some of these immunohistochemical stains may have been developed and the  performance characteristics determine by [facility]. Some  may not have been cleared or approved by the U.S. Food and Drug  Administration. The FDA has determined that such clearance or approval  is not necessary. This test is used for clinical purposes. It should not  be regarded as investigational or for research. This laboratory is  certified under the [place]  (CLIA-88) as qualified to perform high complexity clinical laboratory  testing. The controls stained appropriately.   ADDENDUM:   ** Please note this testing was performed and interpreted by an outside  facility. This addendum is only being added to provide a summary of the  results for report completeness. Please see electronic medical record  for a copy of the full report. **   Karyotype: 47,?XY,?del(?9)?(?q13q22)?,?+?mar[?3]?/?46,?XY[?20]?  Interpretation: ABNORMAL MALE KARYOTYPE   Cytogenetic analysis shows an abnormal male karyotype. 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 The remaining twenty cells show a normal  karyotype.   Deletion of chromosome 9q occurs in both myeloid (more common) and  lymphoid disorders, including acute myeloid leukemia (AML) (more  commonly) and myelodysplastic syndrome (MDS) (only rarely). In MDS the  prognostic significance would be expected to be intermediate.  In AML  the prognosis is variable. Marker chromosomes and structural  chromosomal aberrations in the absence of autosomal monosomies have  minimal prognostic impact. Correlation with other clinical and  laboratory findings is indicated.    RADIOGRAPHIC STUDIES:  No results found.  ASSESSMENT & PLAN Charles Li 80 y.o. 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   In the interim since his last visit the patient has been doing quite well.  He is at his baseline health with no signs/symptoms of recurrence.  He is not having any new or concerning symptoms at this time. Review of Mr. Petruzzi's PET CT scan from 11/17/2019 is reassuring with complete resolution of FDG avidity in the involved lymph nodes.   The treatment plan was for Rituximab 375 mg/m2, Cyclophosphamide 789m/m2, etoposide IV 554mm2 (followed by 10065m2 PO on Day 2 and 3), and Vincristine 1.4 mg/m2 all on Day 1. The patient will also received PO prednisone 100m31my 1-5 of each 3 week cycle. Patient recieved ISRT with Dr. SquiIsidore Moosmpleted on 12/23/2019.   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 Bone marrow biopsy confirmed no alternative sites of lymphoma.  -- started R-CEOP chemotherapy on 09/14/2019. This is therapy with curative intent. Etoposide was substituted for anthracycline therapy due to his atrial fibrillation found on TTE.  -- At this  time his findings are consistent with a Stage I. Patient completed 3 cycles of R-CEOP with ISRT to site of disease. Interval PET scan on 11/17/2019 showed no active disease. Patient completed radiation therapy on 12/23/2019. --routine follow up imaging is not recommended per NCCN guidelines. This can be performed as clinically indicated.   --RTC in 6 months for continued post treatment surviellance  #Symptom Management --no longer requires anti-nausea medication. Never needed during chemotherapy.  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 We were agreeable to deferring evaluation to a later time.  --kidney function is stable and he has no urinary/pain symptoms --defer management urology service.   #Atrial Fibrillation --currently following with cardiology, underwent cardioversion on 12/16/2019.  --no bleeding, bruising, or other issues on Eliquis.   No orders of the defined types were placed in this encounter.   All questions were answered. The patient knows to call the clinic with any problems, questions or concerns.  A total of more than 30 minutes were spent on this encounter and over half of that time was spent on counseling and coordination of care as outlined above.   JohnLedell Peoples Department of Hematology/Oncology [facility]: [phone]: [phone]: [email or web address]  07/11/2020 11:35 AM

## 2020-07-11 ENCOUNTER — Inpatient Hospital Stay: Payer: Medicare HMO | Attending: Hematology and Oncology | Admitting: Hematology and Oncology

## 2020-07-11 ENCOUNTER — Encounter: Payer: Self-pay | Admitting: Hematology and Oncology

## 2020-07-11 ENCOUNTER — Other Ambulatory Visit: Payer: Self-pay

## 2020-07-11 ENCOUNTER — Inpatient Hospital Stay: Payer: Medicare HMO

## 2020-07-11 VITALS — BP 153/78 | HR 55 | Temp 98.3°F | Resp 18 | Ht 70.0 in | Wt 182.0 lb

## 2020-07-11 DIAGNOSIS — C8331 Diffuse large B-cell lymphoma, lymph nodes of head, face, and neck: Secondary | ICD-10-CM | POA: Diagnosis not present

## 2020-07-11 DIAGNOSIS — C8330 Diffuse large B-cell lymphoma, unspecified site: Secondary | ICD-10-CM | POA: Insufficient documentation

## 2020-07-11 DIAGNOSIS — I4891 Unspecified atrial fibrillation: Secondary | ICD-10-CM | POA: Insufficient documentation

## 2020-07-11 DIAGNOSIS — I10 Essential (primary) hypertension: Secondary | ICD-10-CM | POA: Insufficient documentation

## 2020-07-11 DIAGNOSIS — N1330 Unspecified hydronephrosis: Secondary | ICD-10-CM | POA: Insufficient documentation

## 2020-07-11 DIAGNOSIS — Z95828 Presence of other vascular implants and grafts: Secondary | ICD-10-CM

## 2020-07-11 DIAGNOSIS — Z7901 Long term (current) use of anticoagulants: Secondary | ICD-10-CM | POA: Diagnosis not present

## 2020-07-11 DIAGNOSIS — Z79899 Other long term (current) drug therapy: Secondary | ICD-10-CM | POA: Insufficient documentation

## 2020-07-11 DIAGNOSIS — Z923 Personal history of irradiation: Secondary | ICD-10-CM | POA: Insufficient documentation

## 2020-07-11 LAB — CMP (CANCER CENTER ONLY)
ALT: 14 U/L (ref 0–44)
AST: 14 U/L — ABNORMAL LOW (ref 15–41)
Albumin: 3.8 g/dL (ref 3.5–5.0)
Alkaline Phosphatase: 58 U/L (ref 38–126)
Anion gap: 4 — ABNORMAL LOW (ref 5–15)
BUN: 16 mg/dL (ref 8–23)
CO2: 32 mmol/L (ref 22–32)
Calcium: 9.6 mg/dL (ref 8.9–10.3)
Chloride: 104 mmol/L (ref 98–111)
Creatinine: 0.97 mg/dL (ref 0.61–1.24)
GFR, Estimated: >60 mL/min (ref >60)
Glucose, Bld: 107 mg/dL — ABNORMAL HIGH (ref 70–99)
Potassium: 4.2 mmol/L (ref 3.5–5.1)
Sodium: 140 mmol/L (ref 135–145)
Total Bilirubin: 0.8 mg/dL (ref 0.3–1.2)
Total Protein: 6.5 g/dL (ref 6.5–8.1)

## 2020-07-11 LAB — CBC WITH DIFFERENTIAL (CANCER CENTER ONLY)
Abs Immature Granulocytes: 0.03 10*3/uL (ref 0.00–0.07)
Basophils Absolute: 0.1 10*3/uL (ref 0.0–0.1)
Basophils Relative: 1 %
Eosinophils Absolute: 0.1 10*3/uL (ref 0.0–0.5)
Eosinophils Relative: 2 %
HCT: 41.3 % (ref 39.0–52.0)
Hemoglobin: 14.3 g/dL (ref 13.0–17.0)
Immature Granulocytes: 0 %
Lymphocytes Relative: 12 %
Lymphs Abs: 0.9 10*3/uL (ref 0.7–4.0)
MCH: 33.3 pg (ref 26.0–34.0)
MCHC: 34.6 g/dL (ref 30.0–36.0)
MCV: 96 fL (ref 80.0–100.0)
Monocytes Absolute: 0.7 10*3/uL (ref 0.1–1.0)
Monocytes Relative: 10 %
Neutro Abs: 5.5 10*3/uL (ref 1.7–7.7)
Neutrophils Relative %: 75 %
Platelet Count: 210 10*3/uL (ref 150–400)
RBC: 4.3 MIL/uL (ref 4.22–5.81)
RDW: 12.6 % (ref 11.5–15.5)
WBC Count: 7.4 10*3/uL (ref 4.0–10.5)
nRBC: 0 % (ref 0.0–0.2)

## 2020-07-11 LAB — LACTATE DEHYDROGENASE: LDH: 134 U/L (ref 98–192)

## 2020-07-12 ENCOUNTER — Telehealth: Payer: Self-pay | Admitting: Hematology and Oncology

## 2020-07-12 NOTE — Telephone Encounter (Signed)
Scheduled per 1/3 los. Called and spoke with pt, confirmed 7/6 appts

## 2020-11-15 DIAGNOSIS — I4891 Unspecified atrial fibrillation: Secondary | ICD-10-CM | POA: Diagnosis not present

## 2020-11-15 DIAGNOSIS — Z85818 Personal history of malignant neoplasm of other sites of lip, oral cavity, and pharynx: Secondary | ICD-10-CM | POA: Diagnosis not present

## 2020-11-15 DIAGNOSIS — N529 Male erectile dysfunction, unspecified: Secondary | ICD-10-CM | POA: Diagnosis not present

## 2020-11-15 DIAGNOSIS — Z85828 Personal history of other malignant neoplasm of skin: Secondary | ICD-10-CM | POA: Diagnosis not present

## 2020-11-15 DIAGNOSIS — I10 Essential (primary) hypertension: Secondary | ICD-10-CM | POA: Diagnosis not present

## 2020-11-15 DIAGNOSIS — Z008 Encounter for other general examination: Secondary | ICD-10-CM | POA: Diagnosis not present

## 2020-11-15 DIAGNOSIS — H409 Unspecified glaucoma: Secondary | ICD-10-CM | POA: Diagnosis not present

## 2020-11-15 DIAGNOSIS — Z7901 Long term (current) use of anticoagulants: Secondary | ICD-10-CM | POA: Diagnosis not present

## 2020-11-15 DIAGNOSIS — E785 Hyperlipidemia, unspecified: Secondary | ICD-10-CM | POA: Diagnosis not present

## 2020-11-15 DIAGNOSIS — D6869 Other thrombophilia: Secondary | ICD-10-CM | POA: Diagnosis not present

## 2020-11-15 DIAGNOSIS — Z8249 Family history of ischemic heart disease and other diseases of the circulatory system: Secondary | ICD-10-CM | POA: Diagnosis not present

## 2021-01-11 ENCOUNTER — Other Ambulatory Visit: Payer: Self-pay | Admitting: Hematology and Oncology

## 2021-01-11 ENCOUNTER — Other Ambulatory Visit: Payer: Self-pay

## 2021-01-11 ENCOUNTER — Inpatient Hospital Stay: Payer: Medicare HMO

## 2021-01-11 ENCOUNTER — Inpatient Hospital Stay: Payer: Medicare HMO | Attending: Hematology and Oncology | Admitting: Hematology and Oncology

## 2021-01-11 VITALS — BP 141/100 | HR 47 | Temp 98.0°F | Resp 17 | Wt 185.1 lb

## 2021-01-11 DIAGNOSIS — C8330 Diffuse large B-cell lymphoma, unspecified site: Secondary | ICD-10-CM | POA: Diagnosis not present

## 2021-01-11 DIAGNOSIS — C8331 Diffuse large B-cell lymphoma, lymph nodes of head, face, and neck: Secondary | ICD-10-CM

## 2021-01-11 DIAGNOSIS — Z79899 Other long term (current) drug therapy: Secondary | ICD-10-CM | POA: Diagnosis not present

## 2021-01-11 DIAGNOSIS — Z923 Personal history of irradiation: Secondary | ICD-10-CM | POA: Insufficient documentation

## 2021-01-11 LAB — CBC WITH DIFFERENTIAL (CANCER CENTER ONLY)
Abs Immature Granulocytes: 0.04 10*3/uL (ref 0.00–0.07)
Basophils Absolute: 0.1 10*3/uL (ref 0.0–0.1)
Basophils Relative: 1 %
Eosinophils Absolute: 0.1 10*3/uL (ref 0.0–0.5)
Eosinophils Relative: 1 %
HCT: 41.9 % (ref 39.0–52.0)
Hemoglobin: 14.7 g/dL (ref 13.0–17.0)
Immature Granulocytes: 0 %
Lymphocytes Relative: 13 %
Lymphs Abs: 1.3 10*3/uL (ref 0.7–4.0)
MCH: 32.7 pg (ref 26.0–34.0)
MCHC: 35.1 g/dL (ref 30.0–36.0)
MCV: 93.1 fL (ref 80.0–100.0)
Monocytes Absolute: 0.9 10*3/uL (ref 0.1–1.0)
Monocytes Relative: 9 %
Neutro Abs: 8 10*3/uL — ABNORMAL HIGH (ref 1.7–7.7)
Neutrophils Relative %: 76 %
Platelet Count: 197 10*3/uL (ref 150–400)
RBC: 4.5 MIL/uL (ref 4.22–5.81)
RDW: 12.7 % (ref 11.5–15.5)
WBC Count: 10.4 10*3/uL (ref 4.0–10.5)
nRBC: 0 % (ref 0.0–0.2)

## 2021-01-11 LAB — CMP (CANCER CENTER ONLY)
ALT: 13 U/L (ref 0–44)
AST: 15 U/L (ref 15–41)
Albumin: 3.7 g/dL (ref 3.5–5.0)
Alkaline Phosphatase: 57 U/L (ref 38–126)
Anion gap: 6 (ref 5–15)
BUN: 21 mg/dL (ref 8–23)
CO2: 30 mmol/L (ref 22–32)
Calcium: 9.4 mg/dL (ref 8.9–10.3)
Chloride: 106 mmol/L (ref 98–111)
Creatinine: 0.88 mg/dL (ref 0.61–1.24)
GFR, Estimated: >60 mL/min (ref >60)
Glucose, Bld: 103 mg/dL — ABNORMAL HIGH (ref 70–99)
Potassium: 4.5 mmol/L (ref 3.5–5.1)
Sodium: 142 mmol/L (ref 135–145)
Total Bilirubin: 0.7 mg/dL (ref 0.3–1.2)
Total Protein: 6.4 g/dL — ABNORMAL LOW (ref 6.5–8.1)

## 2021-01-11 LAB — LACTATE DEHYDROGENASE: LDH: 176 U/L (ref 98–192)

## 2021-01-11 NOTE — Progress Notes (Signed)
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 Lucia Li for tonsillar mass present for a few weeks. 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 FISH studies pending.  2) 08/17/2019: establish care with Dr. Lorenso Li   3) 08/28/2019: PET CT scan reveals activity in left lingual tonsil, level 2 lymph node, and level 3 lymph node. Consistent with Stage I 4) 09/04/2019: Bone marrow biopsy shows no evidence of lymphoma in the bone marrow.  5) 09/04/2019; TTE showed EF 60-65%, however patient found to have new atrial fibrillation.  6) 09/14/2019: started R-CEOP chemotherapy, Cycle 1 Day 1 7) 10/05/2019: R-CEOP chemotherapy, Cycle 2 Day 1 8) 10/26/2019: R-CEOP chemotherapy Cycle 3 Day 1 9) 11/30/2019- 12/23/2019: Radiation therapy with Dr. Isidore Moos  HISTORY OF PRESENTING ILLNESS:  Charles Li 80 y.o. male with medical history significant for left tonsillar B cell lymphoma presents for f/u. He was last seen on 07/11/2020. In the interim he has had no major changes in health, hospitalizations, or ED visits.   On exam today Charles Li notes he has been doing pretty good in the interim since her last visit.  He reports that he does have good appetite and no soreness in the back of his mouth.  He is also not noticed any lumps or bumps at the sites of his lymph nodes.  He did unfortunately have a recent fall out of a chair while he was  sitting outside and bumped his head on the right side where he is developed a lump.  He is unsure why he fell out of the chair and he notes it was not a particularly hot day.  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  He had no additional questions concerns or complaints today.   He otherwise denies having issues with fevers, chills, sweats, nausea, vomiting or diarrhea.  He denies having any new lymphadenopathy, weakness, shortness of breath, or fatigue.  A full 10 point ROS is listed below.  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  MEDICATIONS:  Current Outpatient Medications  Medication Sig Dispense Refill   amLODipine (NORVASC) 2.5 MG tablet Take 1 tablet (2.5 mg total) by mouth daily. Jewell  tablet 1   apixaban (ELIQUIS) 5 MG TABS tablet Take 1 tablet (5 mg total) by mouth 2 (two) times daily. 180 tablet 3   benazepril-hydrochlorthiazide (LOTENSIN HCT) 20-12.5 MG tablet Take 1 tablet by mouth daily. 30 tablet 0   dorzolamide-timolol (COSOPT) 22.3-6.8 MG/ML ophthalmic solution Place 1 drop into both eyes 2 (two) times daily.      Multiple Vitamin (MULTIVITAMIN WITH MINERALS) TABS tablet Take 1 tablet by mouth daily.     Netarsudil-Latanoprost (ROCKLATAN) 0.02-0.005 % SOLN Place 1 drop into both eyes at bedtime.      pravastatin (PRAVACHOL) 40 MG tablet Take 1 tablet (40 mg total) by mouth daily. Please schedule an appointment for further refills. [phone] (Patient taking differently: Take 40 mg by mouth every evening. Please schedule an appointment for further refills. [phone]) 90 tablet 3   No current facility-administered medications for this visit.    REVIEW OF SYSTEMS:   Constitutional: ( - ) fevers, ( - )  chills , ( - ) night sweats (+) hair loss Eyes: ( - ) blurriness of vision, ( - ) double vision, ( - ) watery eyes Ears, nose, mouth, throat, and face: ( - ) mucositis, ( + ) sore throat Respiratory: ( - ) cough, ( - ) dyspnea, ( - ) wheezes Cardiovascular: ( - ) palpitation, ( - ) chest discomfort, ( - ) lower extremity swelling Gastrointestinal:  ( - ) nausea, ( - ) heartburn, ( - ) change in bowel habits Skin: ( - ) abnormal skin rashes Lymphatics: ( - ) new lymphadenopathy, ( - ) easy bruising Neurological: ( - ) numbness, ( - ) tingling, ( - ) new weaknesses Behavioral/Psych: ( - ) mood change, ( - ) new changes  All other systems were reviewed with the patient and are negative.  PHYSICAL EXAMINATION: ECOG PERFORMANCE STATUS: 1 - Symptomatic but completely ambulatory  Vitals:   01/11/21 1001  BP: (!) 141/100  Pulse: (!) 47  Resp: 17  Temp: 98 F (36.7 C)  SpO2: 100%   Filed Weights   01/11/21 1001  Weight: 185 lb 1.6 oz (84 kg)    GENERAL: well appearing elderly Caucasian male in NAD SKIN: skin color, texture, turgor are normal, no rashes or significant lesions.  OROPHARYNX: no swelling, erythema, enlarged tonsils or new lesions.  EYES: conjunctiva are pink and non-injected, sclera clear LUNGS: clear to auscultation and percussion with normal breathing effort HEART: regular rate  & rhythm and no murmurs and no lower extremity edema Musculoskeletal: no cyanosis of digits and no clubbing  PSYCH: alert & oriented x 3, fluent speech NEURO: no focal motor/sensory deficits  LABORATORY DATA:  I have reviewed the data as listed CBC Latest Ref Rng & Units 01/11/2021 07/11/2020 04/07/2020  WBC 4.0 - 10.5 K/uL 10.4 7.4 7.2  Hemoglobin 13.0 - 17.0 g/dL 14.7 14.3 14.1  Hematocrit 39.0 - 52.0 % 41.9 41.3 41.1  Platelets 150 - 400 K/uL 197 210 211    CMP Latest Ref Rng & Units 01/11/2021 07/11/2020 04/07/2020  Glucose 70 - 99 mg/dL 103(H) 107(H) 95  BUN 8 - 23 mg/dL _0 Creatinine 0.61 - 1.24 mg/dL 0.88 0.97 0.86  Sodium 135 - 145 mmol/L 142 140 140  Potassium 3.5 - 5.1 mmol/L 4.5 4.2 3.6  Chloride 98 - 111 mmol/L 106 104 102  CO2 22 - 32 mmol/L 30 32 31  Calcium 8.9 - 10.3 mg/dL 9.4 9.6 9.6  Total Protein 6.5 - 8.1  g/dL 6.4(L) 6.5 6.4(L)  Total Bilirubin 0.3 - 1.2 mg/dL 0.7 0.8 0.8  Alkaline Phos 38 - 126 U/L 57 58 67  AST 15 - 41 U/L 15 14(L) 16  ALT 0 - 44 U/L _0 PATHOLOGY:  SURGICAL PATHOLOGY  CASE: [id number]  PATIENT: Charles Li  Surgical Pathology Report   Clinical History: tonsil cancer (cm)   FINAL MICROSCOPIC DIAGNOSIS:   A. TONSIL, LEFT, BIOPSY:  -  Large B-cell lymphoma  -  See comment   COMMENT:  The tonsillar tissue has an ulcerated surface with underlying atypical  cellular proliferation.  By immunohistochemistry, the neoplastic cells  are positive for CD20, CD10, BCL6 and BCL2 (weak) but negative for CD3,  CD56, CD5, Mum-1, cytokeratin AE1/3 and EBV by in Li hybridization.  The proliferative rate by Ki-67 is 70 to 80%.  Overall, the features are  consistent with a large B-cell lymphoma.  The differential diagnosis  includes diffuse large B-cell lymphoma and high-grade B-cell lymphoma  (double hit).  FISH is pending and will be reported in an addendum.   Preliminary results of this case were given to Dr. Lucia Li on August 14, 2019.   Dr. Tresa Moore reviewed the case and agrees with the above diagnosis.   GROSS DESCRIPTION:   The specimen is received in formalin and consists of a 0.9 x 0.4 x 0.4  cm piece of tan soft tissue.  The specimen is entirely submitted in 1  cassette. Craig Staggers 08/14/2019)   Final Diagnosis performed by Thressa Sheller, MD.   Electronically signed  08/14/2019  Technical and / or Professional components performed at [facility]. [facility], [place] [address] [place], [place] [address].   Immunohistochemistry Technical component (if applicable) was performed  at [facility]. [address], [place],  [place], [place] [address].   IMMUNOHISTOCHEMISTRY DISCLAIMER (if applicable):  Some of these immunohistochemical stains may have been developed and the  performance characteristics determine by [facility]. Some  may not have been cleared or approved by the U.S. Food and Drug  Administration. The FDA has determined that such clearance or approval  is not necessary. This test is used for clinical purposes. It should not  be regarded as investigational or for research. This laboratory is  certified under the [place]  (CLIA-88) as qualified to perform high complexity clinical laboratory  testing.  The controls stained appropriately.  SURGICAL PATHOLOGY  ** THIS IS AN ADDENDUM REPORT **  CASE: [id number]  PATIENT: Charles Li  Bone Marrow Report  **Addendum **   Reason for Addendum #1:  Cytogenetics results   Clinical History: lymphoma, DLBCL, left posterior iliac, (ADC)    DIAGNOSIS:   BONE MARROW, ASPIRATE, CLOT, CORE:  -  Normocellular marrow with trilineage hematopoiesis  -  No lymphoma identified   PERIPHERAL BLOOD:  -  Morphologically unremarkable  -  See complete blood cell count   MICROSCOPIC DESCRIPTION:   PERIPHERAL BLOOD SMEAR: The peripheral blood is morphologically  unremarkable.   BONE  MARROW ASPIRATE: Spicular, cellular and adequate for evaluation  Erythroid precursors: Orderly maturation without overt dysplasia  Granulocytic precursors: Orderly maturation without overt dysplasia  Megakaryocytes: Qualitatively and quantitatively unremarkable  Lymphocytes/plasma cells: No lymphocytosis or plasmacytosis   TOUCH PREPARATIONS: Hypocellular with no additional findings as compared  to the aspirate smears.   CLOT AND BIOPSY: Examination of the core biopsy and clot section reveals  a normocellular  bone marrow (30-40%) with trilineage hematopoiesis.  Myeloid and erythroid elements are present in essentially normal  proportions. Megakaryocytes display a spectrum of maturation without  clustering. There are no granulomas identified. A single well-formed  lymphoid aggregate is noted on the clot section. By  immunohistochemistry, is composed of an admixture of B and T cells  (CD20, CD3) without aberrant B-cell expression of CD5 or CD10.   IRON STAIN: Iron stains are performed on a bone marrow aspirate or touch  imprint smear and section of clot. The controls stained appropriately.        Storage Iron: Present       Ring Sideroblasts: Not identified   ADDITIONAL DATA/TESTING: Cytogenetics is pending and will be reported  separately. Flow cytometry performed on the bone marrow biopsy did not  identify a monoclonal B-cell or phenotypically aberrant T-cell  population (See [id number]).   CELL COUNT DATA:   Bone Marrow count performed on 500 cells shows:  Blasts:   0%   Myeloid:  57%  Promyelocytes: 0%   Erythroid:     28%  Myelocytes:    10%  Lymphocytes:   7%  Metamyelocytes:     2%   Plasma cells:  8%  Bands:    7%  Neutrophils:   33%  M:E ratio:     2.03  Eosinophils:   5%  Basophils:     0%  Monocytes:     0%   Lab Data: CBC performed on 09/04/2019 shows:  WBC: 8.6 k/uL  Neutrophils:   63%  Hgb: 14.2 g/dL Lymphocytes:   30%  HCT: 41.2 %    Monocytes:     3%  MCV:  94.7 fL   Eosinophils:   4%  RDW: 11.8 %    Basophils:     0%  PLT: 263 k/uL   GROSS DESCRIPTION:   A: Aspirate smear   B: Received in B-plus fixative are tissue fragments measuring 2.1 x 1.0  x 0.3 cm in aggregate.  The specimen is submitted in total.   C: Received in B-plus fixative is a 2.0 x 0.2 cm core of bone which is  submitted in toto fine decalcification.  [facility] 09/04/2019)    Final Diagnosis performed by Thressa Sheller, MD.   Electronically signed  09/08/2019  Technical component performed at [place]  [address], [place], [place] [address].   Professional component performed at [employer]. [facility],  [place] [address], [place], [place] [address].   Immunohistochemistry Technical component (if applicable) was performed  at [facility]. [address], [place],  [place], [place] [address].   IMMUNOHISTOCHEMISTRY DISCLAIMER (if applicable):  Some of these immunohistochemical stains may have been developed and the  performance characteristics determine by [facility]. Some  may not have been cleared or approved by the U.S. Food and Drug  Administration. The FDA has determined that such clearance or approval  is not necessary. This test is used for clinical purposes. It should not  be regarded as investigational or for research. This laboratory is  certified under the [place]  (CLIA-88) as qualified to perform high complexity clinical laboratory  testing.  The controls stained appropriately.   ADDENDUM:   ** Please note this testing was performed and interpreted by an outside  facility.  This addendum is only being added to provide a summary of the  results for report completeness.  Please see electronic medical  record  for a copy of the full report. **   Karyotype: 47,?XY,?del(?9)?(?q13q22)?,?+?mar[?3]?/?46,?XY[?20]?  Interpretation:  ABNORMAL MALE KARYOTYPE    Cytogenetic analysis shows an abnormal male karyotype. 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 The remaining twenty cells show a normal  karyotype.   Deletion of chromosome 9q occurs in both myeloid (more common) and  lymphoid disorders, including acute myeloid leukemia (AML) (more  commonly) and myelodysplastic syndrome (MDS) (only rarely). In MDS the  prognostic significance would be expected to be intermediate.   In AML  the prognosis is variable.  Marker chromosomes and structural  chromosomal aberrations in the absence of autosomal monosomies have  minimal prognostic impact. Correlation with other clinical and  laboratory findings is indicated.    RADIOGRAPHIC STUDIES:  No results found.  ASSESSMENT & PLAN Charles Li 80 y.o. male with medical history significant for left tonsillar diffuse B cell lymphoma in remission who presents for f/u.   In the interim since his last visit the patient has been well.  He is at his baseline health with no signs/symptoms of recurrence.  He is not having any new or concerning symptoms at this time. Review of Mr. Ferrufino's PET CT scan from 11/17/2019 is reassuring with complete resolution of FDG avidity in the involved lymph nodes.   The treatment plan was for Rituximab 375 mg/m2, Cyclophosphamide 735m/m2, etoposide IV 571mm2 (followed by 10040m2 PO on Day 2 and 3), and Vincristine 1.4 mg/m2 all on Day 1. The patient will also received PO prednisone 100m81my 1-5 of each 3 week cycle. Patient recieved ISRT with Dr. SquiIsidore Moosmpleted on 12/23/2019.   #Diffuse Large B Cell Lymphoma (MYC Li). Stage I  --initial PET scan findings consistent with a Stage I diffuse large B cell lymphoma of the head/neck. Bone marrow biopsy confirmed no alternative sites of lymphoma.  -- started R-CEOP chemotherapy on 09/14/2019. This is therapy with curative intent. Etoposide was  substituted for anthracycline therapy due to his atrial fibrillation found on TTE.  -- At this time his findings are consistent with a Stage I. Patient completed 3 cycles of R-CEOP with ISRT to site of disease. Interval PET scan on 11/17/2019 showed no active disease. Patient completed radiation therapy on 12/23/2019. --routine follow up imaging is not recommended per NCCN guidelines. This can be performed as clinically indicated.   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 We were agreeable to deferring evaluation to a later time.  --kidney function is stable and he has no urinary/pain symptoms --defer management urology service.   #Atrial Fibrillation --currently following with cardiology, underwent cardioversion on 12/16/2019.  --no bleeding, bruising, or other issues on Eliquis.   No orders of the defined types were placed in this encounter.   All questions were answered. The patient knows to call the clinic with any problems, questions or concerns.  A total of more than 30 minutes were spent on this encounter and over half of that time was spent on counseling and coordination of care as outlined above.   JohnLedell Peoples Department of Hematology/Oncology [facility]: [phone]: [phone]: [email or web address]  01/11/2021 10:38 AM

## 2021-01-12 ENCOUNTER — Telehealth: Payer: Self-pay | Admitting: Hematology and Oncology

## 2021-01-12 NOTE — Telephone Encounter (Signed)
Scheduled follow-up appointment per 7/6 los. Patient is aware. 

## 2021-03-01 ENCOUNTER — Telehealth: Payer: Self-pay

## 2021-03-01 NOTE — Telephone Encounter (Signed)
Spoke with patient's wife.  She states that he has frequent mood swings.  She would like to have him evaluated.  I have encouraged her to set up follow-up with [place] in office and go from there.

## 2021-03-01 NOTE — Telephone Encounter (Signed)
Patient's wife is calling in stating she would like if Dr.Bruchette would give her a call, she wouldn't specify what was going on other than she said Cainan is having some issues going on and doesn't want to bother [place] by calling in himself or scheduling an appt.

## 2021-04-25 ENCOUNTER — Other Ambulatory Visit: Payer: Self-pay

## 2021-04-26 ENCOUNTER — Ambulatory Visit (INDEPENDENT_AMBULATORY_CARE_PROVIDER_SITE_OTHER): Payer: Medicare HMO | Admitting: Family Medicine

## 2021-04-26 VITALS — BP 142/80 | HR 54 | Temp 97.6°F | Wt 185.4 lb

## 2021-04-26 DIAGNOSIS — F339 Major depressive disorder, recurrent, unspecified: Secondary | ICD-10-CM

## 2021-04-26 DIAGNOSIS — I4819 Other persistent atrial fibrillation: Secondary | ICD-10-CM | POA: Diagnosis not present

## 2021-04-26 DIAGNOSIS — Z23 Encounter for immunization: Secondary | ICD-10-CM | POA: Diagnosis not present

## 2021-04-26 DIAGNOSIS — E785 Hyperlipidemia, unspecified: Secondary | ICD-10-CM

## 2021-04-26 DIAGNOSIS — I10 Essential (primary) hypertension: Secondary | ICD-10-CM | POA: Diagnosis not present

## 2021-04-26 DIAGNOSIS — R69 Illness, unspecified: Secondary | ICD-10-CM | POA: Diagnosis not present

## 2021-04-26 MED ORDER — ESCITALOPRAM OXALATE 10 MG PO TABS
10.0000 mg | ORAL_TABLET | Freq: Every day | ORAL | 5 refills | Status: DC
Start: 2021-04-26 — End: 2021-05-19

## 2021-04-26 NOTE — Progress Notes (Signed)
Established Patient Office Visit  Subjective:  Patient ID: Charles Li, male    DOB: March 20, 1941  Age: 80 y.o. MRN: [id number]  CC:  Chief Complaint  Patient presents with   Depression    HPI Charles Li presents for concern for increased depression symptoms and irritability.  He is legally blind and this has been a great frustration for him.  He is accompanied by his wife.  He has not been able to drive for years.  He enjoys writing and has been unable to do this because of his disability.  Does have frequent depressed mood.  No suicidal ideation.  We tried him once before on fluoxetine but he took this for a few weeks and did not see any benefit and stopped.  His chronic problems include history of atrial fibrillation, hypertension, past history of lymphoma in remission, hyperlipidemia.  Medications reviewed.  He plans to see the [place] soon.  No recent lipids here but plans to get through the [place].  He does remain on Eliquis and blood pressure treated with amlodipine and Lotensin HCTZ.    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   HERNIA, VENTRAL 01/04/2009   HYPERLIPIDEMIA 11/28/2007   HYPERTENSION 12/12/2006   Leg swelling 04-24-12   occ., none at present   Persistent atrial fibrillation ([place])    RENAL CALCULUS 03/24/2010   VENOUS INSUFFICIENCY 01/18/2010   Visual disturbance 04-24-12   legally blind    Past Surgical History:  Procedure Laterality Date   CARDIOVERSION N/A 12/16/2019   Procedure: CARDIOVERSION;  Surgeon: [name], MD;  Location: [facility];  Service: Cardiovascular;  Laterality: N/A;   CATARACT EXTRACTION  2009 - approximate   bilat & glaucoma surgery (7 ophth  surgeries)   INGUINAL HERNIA REPAIR  05/02/2012   Procedure: HERNIA REPAIR INGUINAL ADULT BILATERAL;  Surgeon: Shann Medal, MD;  Location: [facility];  Service: General;  Laterality: N/A;   IR IMAGING GUIDED PORT INSERTION  09/07/2019   IR REMOVAL TUN ACCESS W/ PORT W/O FL MOD SED  01/25/2020   VASECTOMY  04-24-12   VENTRAL HERNIA REPAIR  05/02/2012   Procedure: LAPAROSCOPIC VENTRAL HERNIA;  Surgeon: Shann Medal, MD;  Location: [facility];  Service: General;  Laterality: N/A;    Family History  Problem Relation Age of Onset   Cancer Mother    Pneumonia Father    Heart disease Brother        congen heart disease   Birth defects Paternal Aunt        lung   Colon cancer Neg Hx    Stomach cancer Neg Hx     Social History   Socioeconomic History   Marital status: Married    Spouse name: Not on file   Number of children: Not on file   Years of education: Not on file   Highest education level: Not on file  Occupational History   Not on file  Tobacco Use   Smoking status: Former    Packs/day: 1.50    Years: 14.00    Pack years: 21.00    Types: Cigarettes    Quit date: 11/06/1969    Years  since quitting: 51.5   Smokeless tobacco: Never  Vaping Use   Vaping Use: Never used  Substance and Sexual Activity   Alcohol use: Yes    Alcohol/week: 14.0 standard drinks    Types: 14 Glasses of wine per week    Comment: wine & beer - 3 glasses daily; Waits 10 days after chemo treatment   Drug use: No   Sexual activity: Yes  Other Topics Concern   Not on file  Social History Narrative   Lives in [place] with wife.      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 Strain: Low Risk    Difficulty of Paying Living Expenses: Not hard at all  Food Insecurity: No Food Insecurity   Worried About Charity fundraiser in the Last Year: Never true   Arboriculturist in the Last Year: Never true  Transportation Needs: No Transportation Needs    Lack of Transportation (Medical): No   Lack of Transportation (Non-Medical): No  Physical Activity: Inactive   Days of Exercise per Week: 0 days   Minutes of Exercise per Session: 0 min  Stress: No Stress Concern Present   Feeling of Stress : Not at all  Social Connections: Moderately Isolated   Frequency of Communication with Friends and Family: Twice a week   Frequency of Social Gatherings with Friends and Family: Once a week   Attends Religious Services: Never   Marine scientist or Organizations: No   Attends Music therapist: Never   Marital Status: Married  Human resources officer Violence: Not At Risk   Fear of Current or Ex-Partner: No   Emotionally Abused: No   Physically Abused: No   Sexually Abused: No    Outpatient Medications Prior to Visit  Medication Sig Dispense Refill   amLODipine (NORVASC) 2.5 MG tablet Take 1 tablet (2.5 mg total) by mouth daily. 90 tablet 1   apixaban (ELIQUIS) 5 MG TABS tablet Take 1 tablet (5 mg total) by mouth 2 (two) times daily. 180 tablet 3   benazepril-hydrochlorthiazide (LOTENSIN HCT) 20-12.5 MG tablet Take 1 tablet by mouth daily. 30 tablet 0   dorzolamide-timolol (COSOPT) 22.3-6.8 MG/ML ophthalmic solution Place 1 drop into both eyes 2 (two) times daily.     Multiple Vitamin (MULTIVITAMIN WITH MINERALS) TABS tablet Take 1 tablet by mouth daily.     Netarsudil-Latanoprost (ROCKLATAN) 0.02-0.005 % SOLN Place 1 drop into both eyes at bedtime.      pravastatin (PRAVACHOL) 40 MG tablet Take 1 tablet (40 mg total) by mouth daily. Please schedule an appointment for further refills. [phone] (Patient taking differently: Take 40 mg by mouth every evening. Please schedule an appointment for further refills. [phone]) 90 tablet 3   No facility-administered medications prior to visit.    Allergies  Allergen Reactions   Dorzolamide Anaphylaxis and Dermatitis    Irritation on upper lids    Acetazolamide Diarrhea    Lost  weight also Lost weight also    Bimatoprost Dermatitis and Other (See Comments)    Unknown Unknown    Brimonidine Other (See Comments)    Unknown Unknown    Sulfa Antibiotics    Sulfamethoxazole Hives   Dorzolamide Hcl-Timolol Mal Rash    itching itching    Erythromycin Rash   Penicillins Rash   Sulfonamide Derivatives Rash    ROS Review of Systems  Constitutional:  Negative for fatigue and unexpected weight change.  Eyes:  Negative for visual disturbance.  Respiratory:  Negative for cough, chest tightness and shortness of breath.   Cardiovascular:  Negative for chest pain, palpitations and leg swelling.  Endocrine: Negative for polydipsia and polyuria.  Neurological:  Negative for dizziness, syncope, weakness, light-headedness and headaches.     Objective:    Physical Exam Constitutional:      Appearance: He is well-developed.  Neck:     Thyroid: No thyromegaly.  Cardiovascular:     Rate and Rhythm: Normal rate.  Pulmonary:     Effort: Pulmonary effort is normal. No respiratory distress.     Breath sounds: Normal breath sounds. No wheezing or rales.  Musculoskeletal:     Cervical back: Neck supple.  Neurological:     Mental Status: He is alert.    BP (!) 142/80 (BP Location: Left Arm, Patient Position: Sitting, Cuff Size: Normal)   Pulse (!) 54   Temp 97.6 F (36.4 C) (Oral)   Wt 185 lb 6.4 oz (84.1 kg)   SpO2 95%   BMI 26.60 kg/m  Wt Readings from Last 3 Encounters:  04/26/21 185 lb 6.4 oz (84.1 kg)  01/11/21 185 lb 1.6 oz (84 kg)  07/11/20 182 lb (82.6 kg)     Health Maintenance Due  Topic Date Due   Zoster Vaccines- Shingrix (1 of 2) Never done   COLONOSCOPY (Pts 45-68yrs Insurance coverage will need to be confirmed)  08/06/2018   COVID-19 Vaccine (3 - Moderna risk series) 10/28/2019    There are no preventive care reminders to display for this patient.  Lab Results  Component Value Date   TSH 0.894 12/03/2019   Lab Results  Component  Value Date   WBC 10.4 01/11/2021   HGB 14.7 01/11/2021   HCT 41.9 01/11/2021   MCV 93.1 01/11/2021   PLT 197 01/11/2021   Lab Results  Component Value Date   NA 142 01/11/2021   K 4.5 01/11/2021   CO2 30 01/11/2021   GLUCOSE 103 (H) 01/11/2021   BUN 21 01/11/2021   CREATININE 0.88 01/11/2021   BILITOT 0.7 01/11/2021   ALKPHOS 57 01/11/2021   AST 15 01/11/2021   ALT 13 01/11/2021   PROT 6.4 (L) 01/11/2021   ALBUMIN 3.7 01/11/2021   CALCIUM 9.4 01/11/2021   ANIONGAP 6 01/11/2021   GFR 80.62 07/14/2018   Lab Results  Component Value Date   CHOL 143 07/14/2018   Lab Results  Component Value Date   HDL 63.80 07/14/2018   Lab Results  Component Value Date   LDLCALC 64 07/14/2018   Lab Results  Component Value Date   TRIG 73.0 07/14/2018   Lab Results  Component Value Date   CHOLHDL 2 07/14/2018   Lab Results  Component Value Date   HGBA1C 5.6 11/26/2006      Assessment & Plan:   #1 increased depressed mood and irritability.  Progressive symptoms over several months  -We recommended trial of Lexapro 10 mg once daily -Recommend giving some feedback in 3 to 4 weeks -Also given contact information to set up counseling  #2 hypertension stable -Continue amlodipine and Lotensin HCTZ  #3 hyperlipidemia.  Patient on pravastatin.  Overdue for lipid panel.  We offered lipids today but he would like to go ahead and get these at the [place] and he has upcoming appointment there soon.   Meds ordered this encounter  Medications   escitalopram (LEXAPRO) 10 MG tablet    Sig: Take 1 tablet (10 mg total)  by mouth daily.    Dispense:  30 tablet    Refill:  5    Follow-up: No follow-ups on file.    Carolann Littler, MD

## 2021-04-26 NOTE — Patient Instructions (Signed)
Give me some feedback in 3-4 weeks regarding the Lexapro.

## 2021-05-19 ENCOUNTER — Other Ambulatory Visit: Payer: Self-pay | Admitting: Family Medicine

## 2021-05-22 ENCOUNTER — Telehealth: Payer: Self-pay

## 2021-05-22 NOTE — Telephone Encounter (Signed)
Last OV 04/26/21 for acute concern. Pt states he gets routine physicals from [place] & will continue to have that completed there. Declines to set up appt with PCP for that reason.

## 2021-06-06 ENCOUNTER — Other Ambulatory Visit (INDEPENDENT_AMBULATORY_CARE_PROVIDER_SITE_OTHER): Payer: Medicare HMO

## 2021-06-06 ENCOUNTER — Other Ambulatory Visit: Payer: Self-pay

## 2021-06-06 DIAGNOSIS — E785 Hyperlipidemia, unspecified: Secondary | ICD-10-CM | POA: Diagnosis not present

## 2021-06-06 LAB — LIPID PANEL
Cholesterol: 109 mg/dL (ref 0–200)
HDL: 54.5 mg/dL (ref >39.00)
LDL Cholesterol: 42 mg/dL (ref 0–99)
NonHDL: 54.16
Total CHOL/HDL Ratio: 2
Triglycerides: 61 mg/dL (ref 0.0–149.0)
VLDL: 12.2 mg/dL (ref 0.0–40.0)

## 2021-06-07 ENCOUNTER — Telehealth: Payer: Self-pay | Admitting: Family Medicine

## 2021-06-07 NOTE — Telephone Encounter (Signed)
Spoke to patient to schedule AWV.  He stated insurance came to home to do AWV

## 2021-07-04 ENCOUNTER — Telehealth: Payer: Self-pay | Admitting: Family Medicine

## 2021-07-04 NOTE — Telephone Encounter (Signed)
Pt was last seen in Apr 26, 2021 and pt wife is calling to see if dr burchette would increase escitalopram (LEXAPRO) 10 MG tablet . Due to mood swings  [id number] [place], [place] - [place] Phone:  [phone]  Fax:  [phone]

## 2021-07-05 NOTE — Telephone Encounter (Signed)
Called patient reviewed information he requested that you give him a call. He would like to discuss with you the need for increase. Offered to make him appointment in office he declined. Sates that he needs to talk to him with his wife not present and he is not able to come to appointment without her.

## 2021-07-05 NOTE — Telephone Encounter (Signed)
Last OV 04/26/21: - We recommended trial of Lexapro 10 mg once daily -Recommend giving some feedback in 3 to 4 weeks -Also given contact information to set up counseling  - pt states the increased dosage is not helpful as he continues to have the mood swings. States he has not started counseling & does not plan to do that (said "it's a long story").  Will send to PCP for consideration; pt aware.

## 2021-07-07 NOTE — Telephone Encounter (Signed)
Unable to send information Via txt. Called patient to find out if he has a family member or friend that I could call and give the information to for him. ATC, line was busy

## 2021-07-11 NOTE — Telephone Encounter (Signed)
Spoke with the patient and explained the situation. He asked that I call back and leave a voicemail so that his wife could write it down for him. Called back and left a voicemail with the requested information. Nothing further needed.

## 2021-07-14 ENCOUNTER — Ambulatory Visit: Payer: Medicare HMO | Admitting: Hematology and Oncology

## 2021-07-14 ENCOUNTER — Other Ambulatory Visit: Payer: Medicare HMO

## 2021-07-20 ENCOUNTER — Inpatient Hospital Stay: Payer: Medicare HMO

## 2021-07-20 ENCOUNTER — Inpatient Hospital Stay: Payer: Medicare HMO | Admitting: Hematology and Oncology

## 2021-07-25 ENCOUNTER — Other Ambulatory Visit: Payer: Self-pay | Admitting: *Deleted

## 2021-07-25 DIAGNOSIS — C8331 Diffuse large B-cell lymphoma, lymph nodes of head, face, and neck: Secondary | ICD-10-CM

## 2021-07-26 ENCOUNTER — Inpatient Hospital Stay: Payer: Medicare HMO | Admitting: Hematology and Oncology

## 2021-07-26 ENCOUNTER — Telehealth: Payer: Self-pay | Admitting: Family Medicine

## 2021-07-26 ENCOUNTER — Other Ambulatory Visit: Payer: Self-pay

## 2021-07-26 ENCOUNTER — Inpatient Hospital Stay: Payer: Medicare HMO | Attending: Hematology and Oncology

## 2021-07-26 VITALS — BP 153/100 | HR 61 | Temp 98.0°F | Resp 18 | Wt 189.1 lb

## 2021-07-26 DIAGNOSIS — Z95828 Presence of other vascular implants and grafts: Secondary | ICD-10-CM

## 2021-07-26 DIAGNOSIS — C8331 Diffuse large B-cell lymphoma, lymph nodes of head, face, and neck: Secondary | ICD-10-CM | POA: Insufficient documentation

## 2021-07-26 LAB — CBC WITH DIFFERENTIAL (CANCER CENTER ONLY)
Abs Immature Granulocytes: 0.03 10*3/uL (ref 0.00–0.07)
Basophils Absolute: 0.1 10*3/uL (ref 0.0–0.1)
Basophils Relative: 1 %
Eosinophils Absolute: 0.1 10*3/uL (ref 0.0–0.5)
Eosinophils Relative: 1 %
HCT: 43.6 % (ref 39.0–52.0)
Hemoglobin: 15.7 g/dL (ref 13.0–17.0)
Immature Granulocytes: 0 %
Lymphocytes Relative: 18 %
Lymphs Abs: 1.8 10*3/uL (ref 0.7–4.0)
MCH: 33.2 pg (ref 26.0–34.0)
MCHC: 36 g/dL (ref 30.0–36.0)
MCV: 92.2 fL (ref 80.0–100.0)
Monocytes Absolute: 0.7 10*3/uL (ref 0.1–1.0)
Monocytes Relative: 8 %
Neutro Abs: 6.9 10*3/uL (ref 1.7–7.7)
Neutrophils Relative %: 72 %
Platelet Count: 216 10*3/uL (ref 150–400)
RBC: 4.73 MIL/uL (ref 4.22–5.81)
RDW: 12.6 % (ref 11.5–15.5)
WBC Count: 9.5 10*3/uL (ref 4.0–10.5)
nRBC: 0 % (ref 0.0–0.2)

## 2021-07-26 LAB — CMP (CANCER CENTER ONLY)
ALT: 16 U/L (ref 0–44)
AST: 19 U/L (ref 15–41)
Albumin: 4.4 g/dL (ref 3.5–5.0)
Alkaline Phosphatase: 54 U/L (ref 38–126)
Anion gap: 7 (ref 5–15)
BUN: 21 mg/dL (ref 8–23)
CO2: 31 mmol/L (ref 22–32)
Calcium: 9.9 mg/dL (ref 8.9–10.3)
Chloride: 100 mmol/L (ref 98–111)
Creatinine: 1.02 mg/dL (ref 0.61–1.24)
GFR, Estimated: >60 mL/min (ref >60)
Glucose, Bld: 114 mg/dL — ABNORMAL HIGH (ref 70–99)
Potassium: 4.1 mmol/L (ref 3.5–5.1)
Sodium: 138 mmol/L (ref 135–145)
Total Bilirubin: 0.9 mg/dL (ref 0.3–1.2)
Total Protein: 7.2 g/dL (ref 6.5–8.1)

## 2021-07-26 LAB — LACTATE DEHYDROGENASE: LDH: 155 U/L (ref 98–192)

## 2021-07-26 NOTE — Telephone Encounter (Signed)
Patient had requested resources for [name] counselors in the [name] area.  I came up with the following list  -[name] [facility] [phone]  -[name] [facility] [phone] extension [phone]  -Dr. Emeline [place] [phone]  -[name] e-mail [email or web address]  -[name] email [email or web address]  Patient is legally blind and will need some assistance with getting these numbers down somehow.  I do not know if his wife can assist

## 2021-07-26 NOTE — Progress Notes (Signed)
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 Lucia Li for tonsillar mass present for a few weeks. 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 FISH studies pending.  2) 08/17/2019: establish care with Dr. Lorenso Li   3) 08/28/2019: PET CT scan reveals activity in left lingual tonsil, level 2 lymph node, and level 3 lymph node. Consistent with Stage I 4) 09/04/2019: Bone marrow biopsy shows no evidence of lymphoma in the bone marrow.  5) 09/04/2019; TTE showed EF 60-65%, however patient found to have new atrial fibrillation.  6) 09/14/2019: started R-CEOP chemotherapy, Cycle 1 Day 1 7) 10/05/2019: R-CEOP chemotherapy, Cycle 2 Day 1 8) 10/26/2019: R-CEOP chemotherapy Cycle 3 Day 1 9) 11/30/2019- 12/23/2019: Radiation therapy with Dr. Isidore Li  HISTORY OF PRESENTING ILLNESS:  Charles Li 81 y.o. male with medical history significant for left tonsillar B cell lymphoma presents for f/u. He was last seen on 01/11/2021. In the interim he has had no major changes in health, hospitalizations, or ED visits.   On exam today Charles Li notes he has been well overall in the interim since her last visit.  His energy levels are "quite good" for an 81 year old.  He reports that his weight has increased and become steady.  His appetite has remained good.  He denies any lymphadenopathy or distention of his stomach.  He is not having any issues  with bleeding or bruising.  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  He had no additional questions concerns or complaints today.   He otherwise denies having issues with fevers, chills, sweats, nausea, vomiting or diarrhea.  He denies having any new lymphadenopathy, weakness, shortness of breath, or fatigue.  A full 10 point ROS is listed below.  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  MEDICATIONS:  Current Outpatient Medications  Medication Sig Dispense Refill   amLODipine (NORVASC) 2.5 MG tablet Take 1 tablet (2.5 mg total) by mouth daily. 90 tablet 1   apixaban (ELIQUIS) 5 MG TABS tablet Take 1 tablet (5 mg total) by mouth 2 (two) times daily. 180 tablet 3   benazepril-hydrochlorthiazide (LOTENSIN HCT) 20-12.5 MG tablet Take 1 tablet by mouth daily. 30 tablet 0   dorzolamide-timolol (COSOPT) 22.3-6.8 MG/ML ophthalmic solution Place 1 drop into both  eyes 2 (two) times daily.     Multiple Vitamin (MULTIVITAMIN WITH MINERALS) TABS tablet Take 1 tablet by mouth daily.     Netarsudil-Latanoprost (ROCKLATAN) 0.02-0.005 % SOLN Place 1 drop into both eyes at bedtime.      pravastatin (PRAVACHOL) 40 MG tablet Take 1  tablet (40 mg total) by mouth daily. Please schedule an appointment for further refills. [phone] (Patient taking differently: Take 40 mg by mouth every evening. Please schedule an appointment for further refills. [phone]) 90 tablet 3   No current facility-administered medications for this visit.    REVIEW OF SYSTEMS:   Constitutional: ( - ) fevers, ( - )  chills , ( - ) night sweats (+) hair loss Eyes: ( - ) blurriness of vision, ( - ) double vision, ( - ) watery eyes Ears, nose, mouth, throat, and face: ( - ) mucositis, ( + ) sore throat Respiratory: ( - ) cough, ( - ) dyspnea, ( - ) wheezes Cardiovascular: ( - ) palpitation, ( - ) chest discomfort, ( - ) lower extremity swelling Gastrointestinal:  ( - ) nausea, ( - ) heartburn, ( - ) change in bowel habits Skin: ( - ) abnormal skin rashes Lymphatics: ( - ) new lymphadenopathy, ( - ) easy bruising Neurological: ( - ) numbness, ( - ) tingling, ( - ) new weaknesses Behavioral/Psych: ( - ) mood change, ( - ) new changes  All other systems were reviewed with the patient and are negative.  PHYSICAL EXAMINATION: ECOG PERFORMANCE STATUS: 1 - Symptomatic but completely ambulatory  Vitals:   07/26/21 1415  BP: (!) 153/100  Pulse: 61  Resp: 18  Temp: 98 F (36.7 C)  SpO2: 100%    Filed Weights   07/26/21 1415  Weight: 189 lb 1 oz (85.8 kg)     GENERAL: well appearing elderly Caucasian male in NAD SKIN: skin color, texture, turgor are normal, no rashes or significant lesions.  OROPHARYNX: no swelling, erythema, enlarged tonsils or new lesions.  EYES: conjunctiva are pink and non-injected, sclera clear LUNGS: clear to auscultation and percussion with normal breathing effort HEART: regular rate & rhythm and no murmurs and no lower extremity edema Musculoskeletal: no cyanosis of digits and no clubbing  PSYCH: alert & oriented x 3, fluent speech NEURO: no focal motor/sensory deficits  LABORATORY DATA:  I have reviewed the  data as listed CBC Latest Ref Rng & Units 07/26/2021 01/11/2021 07/11/2020  WBC 4.0 - 10.5 K/uL 9.5 10.4 7.4  Hemoglobin 13.0 - 17.0 g/dL 15.7 14.7 14.3  Hematocrit 39.0 - 52.0 % 43.6 41.9 41.3  Platelets 150 - 400 K/uL 216 197 210    CMP Latest Ref Rng & Units 07/26/2021 01/11/2021 07/11/2020  Glucose 70 - 99 mg/dL 114(H) 103(H) 107(H)  BUN 8 - 23 mg/dL _0 Creatinine 0.61 - 1.24 mg/dL 1.02 0.88 0.97  Sodium 135 - 145 mmol/L 138 142 140  Potassium 3.5 - 5.1 mmol/L 4.1 4.5 4.2  Chloride 98 - 111 mmol/L 100 106 104  CO2 22 - 32 mmol/L 31 30 32  Calcium 8.9 - 10.3 mg/dL 9.9 9.4 9.6  Total Protein 6.5 - 8.1 g/dL 7.2 6.4(L) 6.5  Total Bilirubin 0.3 - 1.2 mg/dL 0.9 0.7 0.8  Alkaline Phos 38 - 126 U/L 54 57 58  AST 15 - 41 U/L 19 15 14(L)  ALT 0 - 44 U/L _1 PATHOLOGY:  SURGICAL PATHOLOGY  CASE: [id number]  PATIENT:  Charles Li  Surgical Pathology Report   Clinical History: tonsil cancer (cm)   FINAL MICROSCOPIC DIAGNOSIS:   A. TONSIL, LEFT, BIOPSY:  -  Large B-cell lymphoma  -  See comment   COMMENT:  The tonsillar tissue has an ulcerated surface with underlying atypical  cellular proliferation.  By immunohistochemistry, the neoplastic cells  are positive for CD20, CD10, BCL6 and BCL2 (weak) but negative for CD3,  CD56, CD5, Mum-1, cytokeratin AE1/3 and EBV by in situ hybridization.  The proliferative rate by Ki-67 is 70 to 80%.  Overall, the features are  consistent with a large B-cell lymphoma.  The differential diagnosis  includes diffuse large B-cell lymphoma and high-grade B-cell lymphoma  (double hit).  FISH is pending and will be reported in an addendum.   Preliminary results of this case were given to Dr. Lucia Li on August 14, 2019.   Dr. Tresa Moore reviewed the case and agrees with the above diagnosis.   GROSS DESCRIPTION:   The specimen is received in formalin and consists of a 0.9 x 0.4 x 0.4  cm piece of tan soft tissue.  The specimen is entirely  submitted in 1  cassette. Charles Li 08/14/2019)   Final Diagnosis performed by Charles Sheller, MD.   Electronically signed  08/14/2019  Technical and / or Professional components performed at [facility]. [facility], [place] [address] [place], [place] [address].   Immunohistochemistry Technical component (if applicable) was performed  at [facility]. [address], [place],  [place], [place] [address].   IMMUNOHISTOCHEMISTRY DISCLAIMER (if applicable):  Some of these immunohistochemical stains may have been developed and the  performance characteristics determine by [facility]. Some  may not have been cleared or approved by the U.S. Food and Drug  Administration. The FDA has determined that such clearance or approval  is not necessary. This test is used for clinical purposes. It should not  be regarded as investigational or for research. This laboratory is  certified under the [place]  (CLIA-88) as qualified to perform high complexity clinical laboratory  testing.  The controls stained appropriately.  SURGICAL PATHOLOGY  ** THIS IS AN ADDENDUM REPORT **  CASE: [id number]  PATIENT: Charles Li  Bone Marrow Report  **Addendum **   Reason for Addendum #1:  Cytogenetics results   Clinical History: lymphoma, DLBCL, left posterior iliac, (ADC)    DIAGNOSIS:   BONE MARROW, ASPIRATE, CLOT, CORE:  -  Normocellular marrow with trilineage hematopoiesis  -  No lymphoma identified   PERIPHERAL BLOOD:  -  Morphologically unremarkable  -  See complete blood cell count   MICROSCOPIC DESCRIPTION:   PERIPHERAL BLOOD SMEAR: The peripheral blood is morphologically  unremarkable.   BONE MARROW ASPIRATE: Spicular, cellular and adequate for evaluation  Erythroid precursors: Orderly maturation without overt dysplasia  Granulocytic precursors: Orderly maturation without overt dysplasia  Megakaryocytes: Qualitatively  and quantitatively unremarkable  Lymphocytes/plasma cells: No lymphocytosis or plasmacytosis   TOUCH PREPARATIONS: Hypocellular with no additional findings as compared  to the aspirate smears.   CLOT AND BIOPSY: Examination of the core biopsy and clot section reveals  a normocellular bone marrow (30-40%) with trilineage hematopoiesis.  Myeloid and erythroid elements are present in essentially normal  proportions. Megakaryocytes display a spectrum of maturation without  clustering. There are no granulomas identified. A single well-formed  lymphoid aggregate is noted on the clot section. By  immunohistochemistry, is composed of an admixture of B  and T cells  (CD20, CD3) without aberrant B-cell expression of CD5 or CD10.   IRON STAIN: Iron stains are performed on a bone marrow aspirate or touch  imprint smear and section of clot. The controls stained appropriately.        Storage Iron: Present       Ring Sideroblasts: Not identified   ADDITIONAL DATA/TESTING: Cytogenetics is pending and will be reported  separately. Flow cytometry performed on the bone marrow biopsy did not  identify a monoclonal B-cell or phenotypically aberrant T-cell  population (See [id number]).   CELL COUNT DATA:   Bone Marrow count performed on 500 cells shows:  Blasts:   0%   Myeloid:  57%  Promyelocytes: 0%   Erythroid:     28%  Myelocytes:    10%  Lymphocytes:   7%  Metamyelocytes:     2%   Plasma cells:  8%  Bands:    7%  Neutrophils:   33%  M:E ratio:     2.03  Eosinophils:   5%  Basophils:     0%  Monocytes:     0%   Lab Data: CBC performed on 09/04/2019 shows:  WBC: 8.6 k/uL  Neutrophils:   63%  Hgb: 14.2 g/dL Lymphocytes:   30%  HCT: 41.2 %    Monocytes:     3%  MCV: 94.7 fL   Eosinophils:   4%  RDW: 11.8 %    Basophils:     0%  PLT: 263 k/uL   GROSS DESCRIPTION:   A: Aspirate smear   B: Received in B-plus fixative are tissue fragments measuring 2.1 x 1.0  x 0.3 cm in aggregate.  The  specimen is submitted in total.   C: Received in B-plus fixative is a 2.0 x 0.2 cm core of bone which is  submitted in toto fine decalcification.  [facility] 09/04/2019)    Final Diagnosis performed by Charles Sheller, MD.   Electronically signed  09/08/2019  Technical component performed at [facility]  [address] [place], [place] [address].   Professional component performed at [employer]. [facility],  [place] [address], [place], [place] [address].   Immunohistochemistry Technical component (if applicable) was performed  at [facility]. [address], [place],  [place],  [address].   IMMUNOHISTOCHEMISTRY DISCLAIMER (if applicable):  Some of these immunohistochemical stains may have been developed and the  performance characteristics determine by [facility]. Some  may not have been cleared or approved by the U.S. Food and Drug  Administration. The FDA has determined that such clearance or approval  is not necessary. This test is used for clinical purposes. It should not  be regarded as investigational or for research. This laboratory is  certified under the [place]  (CLIA-88) as qualified to perform high complexity clinical laboratory  testing.  The controls stained appropriately.   ADDENDUM:   ** Please note this testing was performed and interpreted by an outside  facility.  This addendum is only being added to provide a summary of the  results for report completeness.  Please see electronic medical record  for a copy of the full report. **   Karyotype: 47,?XY,?del(?9)?(?q13q22)?,?+?mar[?3]?/?46,?XY[?20]?  Interpretation:  ABNORMAL MALE KARYOTYPE   Cytogenetic analysis shows an abnormal male karyotype. 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 The remaining  twenty cells show a normal  karyotype.    Deletion of chromosome 9q occurs in both myeloid (more common) and  lymphoid disorders, including acute myeloid leukemia (AML) (more  commonly) and myelodysplastic syndrome (MDS) (only rarely). In MDS the  prognostic significance would be expected to be intermediate.   In AML  the prognosis is variable.  Marker chromosomes and structural  chromosomal aberrations in the absence of autosomal monosomies have  minimal prognostic impact. Correlation with other clinical and  laboratory findings is indicated.    RADIOGRAPHIC STUDIES:  No results found.  ASSESSMENT & PLAN Charles Li 81 y.o. male with medical history significant for left tonsillar diffuse B cell lymphoma in remission who presents for f/u.   In the interim since his last visit the patient has been well.  He is at his baseline health with no signs/symptoms of recurrence.  He is not having any new or concerning symptoms at this time. Review of Charles Li's PET CT scan from 11/17/2019 is reassuring with complete resolution of FDG avidity in the involved lymph nodes.   The treatment plan was for Rituximab 375 mg/m2, Cyclophosphamide 726m/m2, etoposide IV 584mm2 (followed by 10057m2 PO on Day 2 and 3), and Vincristine 1.4 mg/m2 all on Day 1. The patient will also received PO prednisone 100m61my 1-5 of each 3 week cycle. Patient recieved ISRT with Dr. SquiIsidore Li on 12/23/2019.   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 Bone marrow biopsy confirmed no alternative sites of lymphoma.  -- started R-CEOP chemotherapy on 09/14/2019. This is therapy with curative intent. Etoposide was substituted for anthracycline therapy due to his atrial fibrillation found on TTE.  -- At this time his findings are consistent with a Stage I. Patient completed 3 cycles of R-CEOP with ISRT to site of disease. Interval PET scan on  11/17/2019 showed no active disease. Patient completed radiation therapy on 12/23/2019. Plan: --routine follow up imaging can be performed as clinically indicated.   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 We were agreeable to deferring evaluation to a later time.  --kidney function is stable and he has no urinary/pain symptoms --defer management urology service.   #Atrial Fibrillation --currently following with cardiology, underwent cardioversion on 12/16/2019.  --no bleeding, bruising, or other issues on Eliquis.   No orders of the defined types were placed in this encounter.   All questions were answered. The patient knows to call the clinic with any problems, questions or concerns.  A total of more than 30 minutes were spent on this encounter and over half of that time was spent on counseling and coordination of care as outlined above.   Charles Li Department of Hematology/Oncology [facility]: [phone]: [phone]: [email or web address]  08/01/2021 8:57 AM

## 2021-07-26 NOTE — Telephone Encounter (Signed)
I have copied this list into a note for the patient and sent it out in the mail.   Will call to patient to let him know this will arrive via mail.

## 2021-07-26 NOTE — Telephone Encounter (Signed)
Spoke with the patient. He is aware that the list has been mailed.

## 2021-07-27 ENCOUNTER — Telehealth: Payer: Self-pay | Admitting: Hematology and Oncology

## 2021-07-27 NOTE — Telephone Encounter (Signed)
Scheduled per 11/8 los, message has been left with pt 

## 2021-08-01 ENCOUNTER — Encounter: Payer: Self-pay | Admitting: Hematology and Oncology

## 2021-08-08 ENCOUNTER — Telehealth: Payer: Self-pay | Admitting: Family Medicine

## 2021-08-08 NOTE — Telephone Encounter (Signed)
Spoke with patient to  schedule Medicare Annual Wellness Visit (AWV) either virtually or in office.   Patient declined did not want to do  Last AWV 06/21/20  please schedule at anytime with [facility] Nurse Health Advisor 1 or 2   This should be a 45 minute visit.

## 2021-10-09 DIAGNOSIS — Z20828 Contact with and (suspected) exposure to other viral communicable diseases: Secondary | ICD-10-CM | POA: Diagnosis not present

## 2021-12-22 IMAGING — PT NM PET TUM IMG INITIAL (PI) SKULL BASE T - THIGH
1 of 8 series · 1 of 25 positions shown · non-contrast
Comparison: Neck CT 08/11/2019

CLINICAL DATA: Initial treatment strategy for large B-cell
lymphoma.

EXAM:
NUCLEAR MEDICINE PET SKULL BASE TO THIGH
TECHNIQUE: Ten mCi F-18 FDG was injected intravenously. Full-ring PET imaging
was performed from the skull base to thigh after the radiotracer. CT
data was obtained and used for attenuation correction and anatomic
localization.
Fasting blood glucose: 117 mg/dl

[Series 4: ct sk_thigh 5.0 b31f · axial · 5.0mm · 0.98mm/px · 1 of 245 slices shown]
[im 245/245  brain]
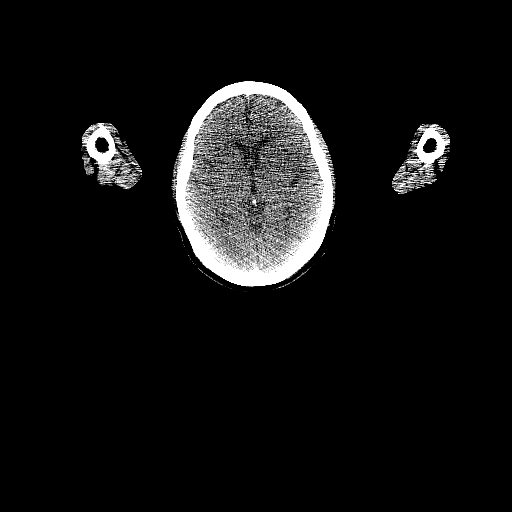

[1 of 25 positions shown; findings below may reference images not displayed]

FINDINGS: Mediastinal blood pool activity: SUV max

Liver activity: SUV max

NECK: Intense hypermetabolic activity localizing to the LEFT lingual
tonsil with SUV max equal 23.7.

Enlarged hypermetabolic LEFT level 2 lymph node measures 18 mm short
axis with SUV max equal 44.6.

Smaller hypermetabolic LEFT level III lymph node measures 5 mm
(image 38/4) with SUV max equal

Incidental CT findings: none

CHEST: No hypermetabolic mediastinal lymph nodes. Hypermetabolic
supraclavicular nodes. No hypermetabolic axillary nodes. No enlarged
lymph nodes.

Within the superior segment LEFT lower lobe, lobular nodule
measuring 9 mm (image 80/4). This pulmonary nodule has mild to
moderate metabolic activity SUV max equal

Incidental CT findings: none

ABDOMEN/PELVIS: No abnormal hypermetabolic activity within the
liver, pancreas, adrenal glands, or spleen. No hypermetabolic lymph
nodes in the abdomen or pelvis.

Spleen is normal volume and normal metabolic activity.

Incidental CT findings: Severe RIGHT hydronephrosis related to
concerning calculus in the mid RIGHT ureter. Calculus measures 7 mm
on image 150/4. There is renal cortical thinning on the RIGHT
consistent with chronic hydronephrosis.

SKELETON: No focal hypermetabolic activity to suggest skeletal
metastasis.

Incidental CT findings: none
IMPRESSION: 1. Intense hypermetabolic activity associated with the LEFT lingual
tonsil consistent with high-grade lymphoma.
2. Enlarged hypermetabolic LEFT level 2 lymph node consistent with
high-grade lymphoma.
3. Smaller LEFT level 3 lymph node is hypermetabolic and concerning
for lymphoma involvement
4. Small moderately metabolic LEFT lobe nodule is indeterminate.
Recommend attention on post therapy scans.
5. Severe chronic RIGHT hydronephrosis secondary to obstructing
calculus in the mid RIGHT ureter.

## 2022-01-01 ENCOUNTER — Ambulatory Visit (INDEPENDENT_AMBULATORY_CARE_PROVIDER_SITE_OTHER): Payer: Medicare HMO | Admitting: Family Medicine

## 2022-01-01 ENCOUNTER — Encounter: Payer: Self-pay | Admitting: Family Medicine

## 2022-01-01 VITALS — BP 142/80 | HR 50 | Temp 98.0°F | Ht 70.0 in | Wt 191.9 lb

## 2022-01-01 DIAGNOSIS — E785 Hyperlipidemia, unspecified: Secondary | ICD-10-CM | POA: Diagnosis not present

## 2022-01-01 DIAGNOSIS — I10 Essential (primary) hypertension: Secondary | ICD-10-CM

## 2022-01-01 DIAGNOSIS — L989 Disorder of the skin and subcutaneous tissue, unspecified: Secondary | ICD-10-CM | POA: Diagnosis not present

## 2022-01-01 NOTE — Progress Notes (Signed)
Established Patient Office Visit  Subjective   Patient ID: Charles Li, male    DOB: Feb 03, 1941  Age: 81 y.o. MRN: [id number]  Chief Complaint  Patient presents with   Mass    Patient complains of mass on head, x2    HPI   Seen for the following items  Tender nodular lesion parieto-occipital area which he first noticed couple months ago.  Gradually growing in size.  Has been tender to touch.  No drainage.  He has chronic problems including atrial fibrillation, hypertension, history of lymphoma, dyslipidemia.  Medications reviewed.  Lipids were checked last fall and stable.  He is getting regular blood work through oncology including CBC and CMP.  Recent electrolytes and renal function stable.  His blood pressures been controlled fairly well in the past.  Currently on combination therapy with benazepril HCTZ and amlodipine.  He has home blood pressure cuff but has not screened this recently.  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   HERNIA, VENTRAL 01/04/2009   HYPERLIPIDEMIA 11/28/2007   HYPERTENSION 12/12/2006   Leg swelling 04-24-12   occ., none at present   Persistent atrial fibrillation (HCC)    RENAL CALCULUS 03/24/2010   VENOUS INSUFFICIENCY 01/18/2010   Visual disturbance 04-24-12   legally blind   Past Surgical History:  Procedure Laterality Date   CARDIOVERSION N/A 12/16/2019   Procedure: CARDIOVERSION;  Surgeon: Chilton Si, MD;  Location: [facility] ENDOSCOPY;  Service: Cardiovascular;  Laterality: N/A;   CATARACT EXTRACTION  2009 - approximate   bilat & glaucoma surgery (7 ophth surgeries)   INGUINAL HERNIA REPAIR  05/02/2012   Procedure: HERNIA REPAIR INGUINAL ADULT BILATERAL;   Surgeon: Kandis Cocking, MD;  Location: [facility] ORS;  Service: General;  Laterality: N/A;   IR IMAGING GUIDED PORT INSERTION  09/07/2019   IR REMOVAL TUN ACCESS W/ PORT W/O FL MOD SED  01/25/2020   VASECTOMY  04-24-12   VENTRAL HERNIA REPAIR  05/02/2012   Procedure: LAPAROSCOPIC VENTRAL HERNIA;  Surgeon: Kandis Cocking, MD;  Location: [facility] ORS;  Service: General;  Laterality: N/A;    reports that he quit smoking about 52 years ago. His smoking use included cigarettes. He has a 21.00 pack-year smoking history. He has never used smokeless tobacco. He reports current alcohol use of about 14.0 standard drinks of alcohol per week. He reports that he does not use drugs. family history includes Birth defects in his paternal aunt; Cancer in his mother; Heart disease in his brother; Pneumonia in his father. Allergies  Allergen Reactions   Dorzolamide Anaphylaxis and Dermatitis    Irritation on upper lids    Acetazolamide Diarrhea    Lost weight also Lost weight also    Bimatoprost Dermatitis and Other (See Comments)    Unknown Unknown    Brimonidine Other (See Comments)    Unknown Unknown    Sulfa Antibiotics    Sulfamethoxazole Hives   Dorzolamide Hcl-Timolol Mal Rash    itching itching    Erythromycin Rash   Penicillins Rash   Sulfonamide Derivatives Rash    Review of Systems  Constitutional:  Negative for malaise/fatigue.  Respiratory:  Negative for shortness of breath.  Cardiovascular:  Negative for chest pain.  Neurological:  Negative for dizziness, weakness and headaches.      Objective:     BP (!) 142/80 (BP Location: Left Arm, Cuff Size: Normal)   Pulse (!) 50   Temp 98 F (36.7 C) (Oral)   Ht 5\' 10"  (1.778 m)   Wt 191 lb 14.4 oz (87 kg)   SpO2 99%   BMI 27.53 kg/m  BP Readings from Last 3 Encounters:  01/01/22 (!) 142/80  07/26/21 (!) 153/100  04/26/21 (!) 142/80   Wt Readings from Last 3 Encounters:  01/01/22 191 lb 14.4 oz (87 kg)  07/26/21 189 lb 1 oz (85.8  kg)  04/26/21 185 lb 6.4 oz (84.1 kg)      Physical Exam Vitals reviewed.  Cardiovascular:     Rate and Rhythm: Normal rate.  Pulmonary:     Effort: Pulmonary effort is normal.     Breath sounds: Normal breath sounds.  Musculoskeletal:     Right lower leg: No edema.     Left lower leg: No edema.  Skin:    Comments: Near the midline parietal occipital area has large nodular lesion with ulcerative crater.  This measures about 3 x 2.2 cm.  Neurological:     Mental Status: He is alert.      No results found for any visits on 01/01/22.    The ASCVD Risk score (Arnett DK, et al., 2019) failed to calculate for the following reasons:   The 2019 ASCVD risk score is only valid for ages 65 to 24    Assessment & Plan:   Problem List Items Addressed This Visit       Unprioritized   Hyperlipidemia   Essential hypertension   Other Visit Diagnoses     Skin lesion    -  Primary   Relevant Orders   Ambulatory referral to Dermatology     -Set up prompt referral to his dermatologist for further evaluation.  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  He suspects probably better at home.  We recommend close monitoring over the next few weeks and be in touch if not consistently less than 140 systolic.  Continue current blood pressure medication with amlodipine 2.5 mg and benazepril HCTZ.  Consider further titration of amlodipine to 5 mg if blood pressure not better controlled by home readings next few weeks  -Consider follow-up in about 6 months and recheck lipids then.  He is getting CBC and CMP regularly through oncology  No follow-ups on file.    Evelena Peat, MD

## 2022-01-02 DIAGNOSIS — C4442 Squamous cell carcinoma of skin of scalp and neck: Secondary | ICD-10-CM | POA: Diagnosis not present

## 2022-01-15 DIAGNOSIS — L0889 Other specified local infections of the skin and subcutaneous tissue: Secondary | ICD-10-CM | POA: Diagnosis not present

## 2022-01-15 DIAGNOSIS — C4442 Squamous cell carcinoma of skin of scalp and neck: Secondary | ICD-10-CM | POA: Diagnosis not present

## 2022-01-25 ENCOUNTER — Inpatient Hospital Stay (HOSPITAL_BASED_OUTPATIENT_CLINIC_OR_DEPARTMENT_OTHER): Payer: Medicare HMO | Admitting: Hematology and Oncology

## 2022-01-25 ENCOUNTER — Other Ambulatory Visit: Payer: Self-pay | Admitting: Hematology and Oncology

## 2022-01-25 ENCOUNTER — Inpatient Hospital Stay: Payer: Medicare HMO | Attending: Hematology and Oncology

## 2022-01-25 VITALS — BP 126/88 | HR 80 | Temp 98.1°F | Resp 17 | Ht 70.0 in | Wt 195.6 lb

## 2022-01-25 DIAGNOSIS — C44329 Squamous cell carcinoma of skin of other parts of face: Secondary | ICD-10-CM | POA: Insufficient documentation

## 2022-01-25 DIAGNOSIS — C8330 Diffuse large B-cell lymphoma, unspecified site: Secondary | ICD-10-CM | POA: Insufficient documentation

## 2022-01-25 DIAGNOSIS — C8331 Diffuse large B-cell lymphoma, lymph nodes of head, face, and neck: Secondary | ICD-10-CM | POA: Diagnosis not present

## 2022-01-25 DIAGNOSIS — Z95828 Presence of other vascular implants and grafts: Secondary | ICD-10-CM

## 2022-01-25 LAB — CBC WITH DIFFERENTIAL (CANCER CENTER ONLY)
Abs Immature Granulocytes: 0.04 10*3/uL (ref 0.00–0.07)
Basophils Absolute: 0 10*3/uL (ref 0.0–0.1)
Basophils Relative: 0 %
Eosinophils Absolute: 0.1 10*3/uL (ref 0.0–0.5)
Eosinophils Relative: 1 %
HCT: 42.2 % (ref 39.0–52.0)
Hemoglobin: 14.8 g/dL (ref 13.0–17.0)
Immature Granulocytes: 0 %
Lymphocytes Relative: 16 %
Lymphs Abs: 1.5 10*3/uL (ref 0.7–4.0)
MCH: 32.7 pg (ref 26.0–34.0)
MCHC: 35.1 g/dL (ref 30.0–36.0)
MCV: 93.4 fL (ref 80.0–100.0)
Monocytes Absolute: 0.8 10*3/uL (ref 0.1–1.0)
Monocytes Relative: 8 %
Neutro Abs: 7.2 10*3/uL (ref 1.7–7.7)
Neutrophils Relative %: 75 %
Platelet Count: 249 10*3/uL (ref 150–400)
RBC: 4.52 MIL/uL (ref 4.22–5.81)
RDW: 12.3 % (ref 11.5–15.5)
WBC Count: 9.6 10*3/uL (ref 4.0–10.5)
nRBC: 0 % (ref 0.0–0.2)

## 2022-01-25 LAB — CMP (CANCER CENTER ONLY)
ALT: 19 U/L (ref 0–44)
AST: 19 U/L (ref 15–41)
Albumin: 4.1 g/dL (ref 3.5–5.0)
Alkaline Phosphatase: 54 U/L (ref 38–126)
Anion gap: 6 (ref 5–15)
BUN: 22 mg/dL (ref 8–23)
CO2: 32 mmol/L (ref 22–32)
Calcium: 9.9 mg/dL (ref 8.9–10.3)
Chloride: 100 mmol/L (ref 98–111)
Creatinine: 0.94 mg/dL (ref 0.61–1.24)
GFR, Estimated: >60 mL/min (ref >60)
Glucose, Bld: 106 mg/dL — ABNORMAL HIGH (ref 70–99)
Potassium: 3.8 mmol/L (ref 3.5–5.1)
Sodium: 138 mmol/L (ref 135–145)
Total Bilirubin: 0.7 mg/dL (ref 0.3–1.2)
Total Protein: 6.5 g/dL (ref 6.5–8.1)

## 2022-01-25 LAB — LACTATE DEHYDROGENASE: LDH: 157 U/L (ref 98–192)

## 2022-01-25 NOTE — Progress Notes (Signed)
[facility] [phone]   Fax:([phone]) [phone]  PROGRESS NOTE  Patient Care Team: Eulas Post, MD as PCP - General Ladene Artist, MD as Consulting Physician (Gastroenterology) Earnie Larsson, [facility] as Pharmacist (Pharmacist) [place], Stephani Police, RN as Oncology Nurse Navigator (Oncology) Eppie Gibson, MD as Attending Physician (Radiation Oncology) Orson Slick, MD as Consulting Physician (Hematology and Oncology)  Hematological/Oncological History # Diffuse Large B Cell Lymphoma [facility] Rearrangement, 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 Lucia Gaskins for tonsillar mass present for a few weeks. 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 FISH studies show MYC Rearrangement, negative BCL-2 and BCL-6 2) 08/17/2019: establish care with Dr. Lorenso Courier   3) 08/28/2019: PET CT scan reveals activity in left lingual tonsil, level 2 lymph node, and level 3 lymph node. Consistent with Stage I 4) 09/04/2019: Bone marrow biopsy shows no evidence of lymphoma in the bone marrow.  5) 09/04/2019; TTE showed EF 60-65%, however patient found to have new atrial fibrillation.  6) 09/14/2019: started R-CEOP chemotherapy, Cycle 1 Day 1 7) 10/05/2019: R-CEOP chemotherapy, Cycle 2 Day 1 8) 10/26/2019: R-CEOP chemotherapy Cycle 3 Day 1 9) 11/30/2019- 12/23/2019: Radiation therapy with Dr. Isidore Moos  HISTORY OF PRESENTING ILLNESS:  Charles Li 81 y.o. male with medical history significant for left tonsillar B cell lymphoma presents for f/u. He was last seen on 07/26/2021. 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  On exam today Charles Li notes he has been well overall in the interim since her last visit.  His major issue is been removal of squamous cell carcinoma from his head.  He notes that he is currently applying antibiotic cream and was on doxycycline antibiotic therapy.  He reports that he gets  the stitches out on Monday.  He notes it is not currently causing any pain, bruising, or bleeding.  He reports that he has no signs or symptoms concerning for recurrent lymph nodes.  He reports his appetite is good and his weight has been steadily increasing, up to 195 pounds today.  He reports that he is tolerating his Eliquis therapy well without any bleeding, bruising, or dark stools.  He otherwise denies having issues with fevers, chills, sweats, nausea, vomiting or diarrhea.  He denies having any new lymphadenopathy, weakness, shortness of breath, or fatigue.  A full 10 point ROS is listed below.  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  MEDICATIONS:  Current Outpatient Medications  Medication Sig Dispense Refill   amLODipine (NORVASC) 5 MG tablet Take by mouth.     apixaban (ELIQUIS) 5 MG TABS tablet Take 1 tablet (5 mg total)  by mouth 2 (two) times daily. 180 tablet 3   benazepril-hydrochlorthiazide (LOTENSIN HCT) 20-12.5 MG tablet Take 1 tablet by mouth daily. 30 tablet 0   dorzolamide-timolol (COSOPT) 22.3-6.8 MG/ML ophthalmic solution Place 1 drop into both eyes 2 (two) times  daily.     escitalopram (LEXAPRO) 10 MG tablet      Multiple Vitamin (MULTIVITAMIN WITH MINERALS) TABS tablet Take 1 tablet by mouth daily.     Netarsudil-Latanoprost (ROCKLATAN) 0.02-0.005 % SOLN Place 1 drop into both eyes at bedtime.      pravastatin (PRAVACHOL) 40 MG tablet Take 1 tablet (40 mg total) by mouth daily. Please schedule an appointment for further refills. [phone] (Patient taking differently: Take 40 mg by mouth every evening. Please schedule an appointment for further refills. [phone]) 90 tablet 3   No current facility-administered medications for this visit.    REVIEW OF SYSTEMS:   Constitutional: ( - ) fevers, ( - )  chills , ( - ) night sweats (+) hair loss Eyes: ( - ) blurriness of vision, ( - ) double vision, ( - ) watery eyes Ears, nose, mouth, throat, and face: ( - ) mucositis, ( + ) sore throat Respiratory: ( - ) cough, ( - ) dyspnea, ( - ) wheezes Cardiovascular: ( - ) palpitation, ( - ) chest discomfort, ( - ) lower extremity swelling Gastrointestinal:  ( - ) nausea, ( - ) heartburn, ( - ) change in bowel habits Skin: ( - ) abnormal skin rashes Lymphatics: ( - ) new lymphadenopathy, ( - ) easy bruising Neurological: ( - ) numbness, ( - ) tingling, ( - ) new weaknesses Behavioral/Psych: ( - ) mood change, ( - ) new changes  All other systems were reviewed with the patient and are negative.  PHYSICAL EXAMINATION: ECOG PERFORMANCE STATUS: 1 - Symptomatic but completely ambulatory  Vitals:   01/25/22 1424  BP: 126/88  Pulse: 80  Resp: 17  Temp: 98.1 F (36.7 C)  SpO2: 99%    Filed Weights   01/25/22 1424  Weight: 195 lb 9.6 oz (88.7 kg)     GENERAL: well appearing elderly Caucasian male in NAD SKIN: skin color, texture, turgor are normal, no rashes or significant lesions.  OROPHARYNX: no swelling, erythema, enlarged tonsils or new lesions.  EYES: conjunctiva are pink and non-injected, sclera clear LUNGS: clear to auscultation and  percussion with normal breathing effort HEART: regular rate & rhythm and no murmurs and no lower extremity edema Musculoskeletal: no cyanosis of digits and no clubbing  PSYCH: alert & oriented x 3, fluent speech NEURO: no focal motor/sensory deficits  LABORATORY DATA:  I have reviewed the data as listed    Latest Ref Rng & Units 01/25/2022    1:59 PM 07/26/2021    1:56 PM 01/11/2021    9:28 AM  CBC  WBC 4.0 - 10.5 K/uL 9.6  9.5  10.4   Hemoglobin 13.0 - 17.0 g/dL 14.8  15.7  14.7   Hematocrit 39.0 - 52.0 % 42.2  43.6  41.9   Platelets 150 - 400 K/uL 249  216  197        Latest Ref Rng & Units 01/25/2022    1:59 PM 07/26/2021    1:56 PM 01/11/2021    9:28 AM  CMP  Glucose 70 - 99 mg/dL 106  114  103   BUN 8 - 23 mg/dL _0 Creatinine 0.61 - 1.24 mg/dL 0.94  1.02  0.88   Sodium 135 - 145 mmol/L 138  138  142   Potassium 3.5 - 5.1 mmol/L 3.8  4.1  4.5   Chloride 98 - 111 mmol/L 100  100  106   CO2 22 - 32 mmol/L 32  31  30   Calcium 8.9 - 10.3 mg/dL 9.9  9.9  9.4   Total Protein 6.5 - 8.1 g/dL 6.5  7.2  6.4   Total Bilirubin 0.3 - 1.2 mg/dL 0.7  0.9  0.7   Alkaline Phos 38 - 126 U/L 54  54  57   AST 15 - 41 U/L _0 ALT 0 - 44 U/L _1 PATHOLOGY:  SURGICAL PATHOLOGY  CASE: [id number]  PATIENT: Charles Li  Surgical Pathology Report   Clinical History: tonsil cancer (cm)   FINAL MICROSCOPIC DIAGNOSIS:   A. TONSIL, LEFT, BIOPSY:  -  Large B-cell lymphoma  -  See comment   COMMENT:  The tonsillar tissue has an ulcerated surface with underlying atypical  cellular proliferation.  By immunohistochemistry, the neoplastic cells  are positive for CD20, CD10, BCL6 and BCL2 (weak) but negative for CD3,  CD56, CD5, Mum-1, cytokeratin AE1/3 and EBV by in situ hybridization.  The proliferative rate by Ki-67 is 70 to 80%.  Overall, the features are  consistent with a large B-cell lymphoma.  The differential diagnosis  includes diffuse large B-cell  lymphoma and high-grade B-cell lymphoma  (double hit).  FISH is pending and will be reported in an addendum.   Preliminary results of this case were given to Dr. Lucia Gaskins on August 14, 2019.   Dr. Tresa Moore reviewed the case and agrees with the above diagnosis.   GROSS DESCRIPTION:   The specimen is received in formalin and consists of a 0.9 x 0.4 x 0.4  cm piece of tan soft tissue.  The specimen is entirely submitted in 1  cassette. Craig Staggers 08/14/2019)   Final Diagnosis performed by Thressa Sheller, MD.   Electronically signed  08/14/2019  Technical and / or Professional components performed at [facility]. [facility], [place] [address] [place], [place] [address].   Immunohistochemistry Technical component (if applicable) was performed  at [facility]. [address], [place],  [place], [place] [address].   IMMUNOHISTOCHEMISTRY DISCLAIMER (if applicable):  Some of these immunohistochemical stains may have been developed and the  performance characteristics determine by [facility]. Some  may not have been cleared or approved by the U.S. Food and Drug  Administration. The FDA has determined that such clearance or approval  is not necessary. This test is used for clinical purposes. It should not  be regarded as investigational or for research. This laboratory is  certified under the [place]  (CLIA-88) as qualified to perform high complexity clinical laboratory  testing.  The controls stained appropriately.  SURGICAL PATHOLOGY  ** THIS IS AN ADDENDUM REPORT **  CASE: [id number]  PATIENT: Charles Li  Bone Marrow Report  **Addendum **   Reason for Addendum #1:  Cytogenetics results   Clinical History: lymphoma, DLBCL, left posterior iliac, (ADC)    DIAGNOSIS:   BONE MARROW, ASPIRATE, CLOT, CORE:  -  Normocellular marrow with trilineage hematopoiesis  -  No lymphoma identified   PERIPHERAL BLOOD:  -   Morphologically unremarkable  -  See complete blood cell count   MICROSCOPIC DESCRIPTION:  PERIPHERAL BLOOD SMEAR: The peripheral blood is morphologically  unremarkable.   BONE MARROW ASPIRATE: Spicular, cellular and adequate for evaluation  Erythroid precursors: Orderly maturation without overt dysplasia  Granulocytic precursors: Orderly maturation without overt dysplasia  Megakaryocytes: Qualitatively and quantitatively unremarkable  Lymphocytes/plasma cells: No lymphocytosis or plasmacytosis   TOUCH PREPARATIONS: Hypocellular with no additional findings as compared  to the aspirate smears.   CLOT AND BIOPSY: Examination of the core biopsy and clot section reveals  a normocellular bone marrow (30-40%) with trilineage hematopoiesis.  Myeloid and erythroid elements are present in essentially normal  proportions. Megakaryocytes display a spectrum of maturation without  clustering. There are no granulomas identified. A single well-formed  lymphoid aggregate is noted on the clot section. By  immunohistochemistry, is composed of an admixture of B and T cells  (CD20, CD3) without aberrant B-cell expression of CD5 or CD10.   IRON STAIN: Iron stains are performed on a bone marrow aspirate or touch  imprint smear and section of clot. The controls stained appropriately.        Storage Iron: Present       Ring Sideroblasts: Not identified   ADDITIONAL DATA/TESTING: Cytogenetics is pending and will be reported  separately. Flow cytometry performed on the bone marrow biopsy did not  identify a monoclonal B-cell or phenotypically aberrant T-cell  population (See [id number]).   CELL COUNT DATA:   Bone Marrow count performed on 500 cells shows:  Blasts:   0%   Myeloid:  57%  Promyelocytes: 0%   Erythroid:     28%  Myelocytes:    10%  Lymphocytes:   7%  Metamyelocytes:     2%   Plasma cells:  8%  Bands:    7%  Neutrophils:   33%  M:E ratio:     2.03  Eosinophils:   5%  Basophils:      0%  Monocytes:     0%   Lab Data: CBC performed on 09/04/2019 shows:  WBC: 8.6 k/uL  Neutrophils:   63%  Hgb: 14.2 g/dL Lymphocytes:   30%  HCT: 41.2 %    Monocytes:     3%  MCV: 94.7 fL   Eosinophils:   4%  RDW: 11.8 %    Basophils:     0%  PLT: 263 k/uL   GROSS DESCRIPTION:   A: Aspirate smear   B: Received in B-plus fixative are tissue fragments measuring 2.1 x 1.0  x 0.3 cm in aggregate.  The specimen is submitted in total.   C: Received in B-plus fixative is a 2.0 x 0.2 cm core of bone which is  submitted in toto fine decalcification.  [facility] 09/04/2019)    Final Diagnosis performed by Thressa Sheller, MD.   Electronically signed  09/08/2019  Technical component performed at [facility]  [address] [place], [place] [address].   Professional component performed at [employer]. [facility],  [place] [address] [place], [place] [address].   Immunohistochemistry Technical component (if applicable) was performed  at [facility]. [address], [place],  [place], [place] [address].   IMMUNOHISTOCHEMISTRY DISCLAIMER (if applicable):  Some of these immunohistochemical stains may have been developed and the  performance characteristics determine by [facility]. Some  may not have been cleared or approved by the U.S. Food and Drug  Administration. The FDA has determined that such clearance or approval  is not necessary. This test is used for clinical purposes.  It should not  be regarded as investigational or for research. This laboratory is  certified under the [place]  (CLIA-88) as qualified to perform high complexity clinical laboratory  testing.  The controls stained appropriately.   ADDENDUM:   ** Please note this testing was performed and interpreted by an outside  facility.  This addendum is only being added to provide a summary of the  results for report completeness.  Please  see electronic medical record  for a copy of the full report. **   Karyotype: 47,?XY,?del(?9)?(?q13q22)?,?+?mar[?3]?/?46,?XY[?20]?  Interpretation:  ABNORMAL MALE KARYOTYPE   Cytogenetic analysis shows an abnormal male karyotype. 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 The remaining twenty cells show a normal  karyotype.   Deletion of chromosome 9q occurs in both myeloid (more common) and  lymphoid disorders, including acute myeloid leukemia (AML) (more  commonly) and myelodysplastic syndrome (MDS) (only rarely). In MDS the  prognostic significance would be expected to be intermediate.   In AML  the prognosis is variable.  Marker chromosomes and structural  chromosomal aberrations in the absence of autosomal monosomies have  minimal prognostic impact. Correlation with other clinical and  laboratory findings is indicated.    RADIOGRAPHIC STUDIES:  No results found.  ASSESSMENT & PLAN Charles Li 81 y.o. male with medical history significant for left tonsillar diffuse B cell lymphoma in remission who presents for f/u.   In the interim since his last visit the patient has been well.  He is at his baseline health with no signs/symptoms of recurrence.  He is not having any new or concerning symptoms at this time. Review of Charles Li's PET CT scan from 11/17/2019 is reassuring with complete resolution of FDG avidity in the involved lymph nodes.   The treatment plan was for Rituximab 375 mg/m2, Cyclophosphamide 730m/m2, etoposide IV 53mm2 (followed by 10038m2 PO on Day 2 and 3), and Vincristine 1.4 mg/m2 all on Day 1. The patient will also received PO prednisone 100m93my 1-5 of each 3 week cycle. Patient recieved ISRT with Dr. SquiIsidore Moosmpleted on 12/23/2019.   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 Bone marrow biopsy confirmed no alternative sites of lymphoma.  -- started R-CEOP chemotherapy on 09/14/2019. This is therapy with curative intent. Etoposide was substituted for anthracycline therapy due to his atrial fibrillation found on TTE.  -- At this time his findings are consistent with a Stage I. Patient completed 3 cycles of R-CEOP with ISRT to site of disease. Interval PET scan on 11/17/2019 showed no active disease. Patient completed radiation therapy on 12/23/2019. Plan: --routine follow up imaging can be performed as clinically indicated.   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 We were agreeable to deferring evaluation to a later time.  --kidney function is stable and he has no urinary/pain symptoms --defer management urology service.   #Atrial Fibrillation --currently following with cardiology, underwent cardioversion on 12/16/2019.  --no bleeding, bruising, or other issues on Eliquis.   No orders of the defined types were placed in this encounter.  All questions were answered. The patient knows to call the clinic with any problems, questions or concerns.  A total of more than 30 minutes were spent on this encounter and over half of that time was spent on counseling and coordination of care as outlined above.   Ledell Peoples, MD Department of Hematology/Oncology [facility] Phone: [phone] Pager: [phone] Email: [email or web address]  01/25/2022 2:52 PM

## 2022-01-28 ENCOUNTER — Encounter: Payer: Self-pay | Admitting: Hematology and Oncology

## 2022-02-01 ENCOUNTER — Telehealth: Payer: Self-pay | Admitting: Hematology and Oncology

## 2022-02-01 ENCOUNTER — Other Ambulatory Visit: Payer: Self-pay

## 2022-02-01 NOTE — Telephone Encounter (Signed)
Scheduled per 7/20 los,calender mailed for appt in 6 months

## 2022-02-02 ENCOUNTER — Other Ambulatory Visit: Payer: Self-pay

## 2022-02-02 ENCOUNTER — Encounter: Payer: Self-pay | Admitting: Family Medicine

## 2022-02-02 ENCOUNTER — Ambulatory Visit (INDEPENDENT_AMBULATORY_CARE_PROVIDER_SITE_OTHER): Payer: Medicare HMO | Admitting: Family Medicine

## 2022-02-02 VITALS — BP 132/70 | HR 60 | Temp 98.0°F | Ht 70.0 in | Wt 189.8 lb

## 2022-02-02 DIAGNOSIS — R058 Other specified cough: Secondary | ICD-10-CM

## 2022-02-02 DIAGNOSIS — F419 Anxiety disorder, unspecified: Secondary | ICD-10-CM | POA: Diagnosis not present

## 2022-02-02 DIAGNOSIS — H6591 Unspecified nonsuppurative otitis media, right ear: Secondary | ICD-10-CM | POA: Diagnosis not present

## 2022-02-02 DIAGNOSIS — R69 Illness, unspecified: Secondary | ICD-10-CM | POA: Diagnosis not present

## 2022-02-02 DIAGNOSIS — I10 Essential (primary) hypertension: Secondary | ICD-10-CM

## 2022-02-02 NOTE — Progress Notes (Signed)
Established Patient Office Visit  Subjective   Patient ID: Charles Li, male    DOB: 10-14-1940  Age: 81 y.o. MRN: [id number]  Chief Complaint  Patient presents with   Hair/Scalp Problem   Hearing Problem    Patient complains of hearing problem in right ear, x4 weeks     HPI   Seen with decreased hearing right ear past 4 weeks or so.  Little bit of pain yesterday.  No drainage.  They tried some peroxide without improvement.  No recent flying.  Occasional mild allergy symptoms.  No recent viral URI.  We have seen him recently and he had very large nodular lesion parieto-occipital scalp.  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  He has had some anxiety and agitation symptoms in the past and is currently on Lexapro 10 mg once daily.  They would like to consider further titration of dose.  He has hypertension treated with amlodipine and benazepril HCTZ.  Does have somewhat of a chronic dry cough but not very bothersome.  Relatively mild.  He gets those medications through the [place].  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   HERNIA, VENTRAL 01/04/2009   HYPERLIPIDEMIA 11/28/2007   HYPERTENSION 12/12/2006   Leg swelling 04-24-12   occ., none at present   Persistent atrial fibrillation ([place])    RENAL CALCULUS 03/24/2010   VENOUS INSUFFICIENCY 01/18/2010   Visual disturbance 04-24-12   legally blind   Past Surgical History:  Procedure Laterality Date   CARDIOVERSION N/A 12/16/2019   Procedure: CARDIOVERSION;  Surgeon: [name], MD;  Location: [facility];  Service: Cardiovascular;  Laterality: N/A;   CATARACT EXTRACTION  2009 -  approximate   bilat & glaucoma surgery (7 ophth surgeries)   INGUINAL HERNIA REPAIR  05/02/2012   Procedure: HERNIA REPAIR INGUINAL ADULT BILATERAL;  Surgeon: Shann Medal, MD;  Location: [facility];  Service: General;  Laterality: N/A;   IR IMAGING GUIDED PORT INSERTION  09/07/2019   IR REMOVAL TUN ACCESS W/ PORT W/O FL MOD SED  01/25/2020   VASECTOMY  04-24-12   VENTRAL HERNIA REPAIR  05/02/2012   Procedure: LAPAROSCOPIC VENTRAL HERNIA;  Surgeon: Shann Medal, MD;  Location: [facility];  Service: General;  Laterality: N/A;    reports that he quit smoking about 52 years ago. His smoking use included cigarettes. He has a 21.00 pack-year smoking history. He has never used smokeless tobacco. He reports current alcohol use of about 14.0 standard drinks of alcohol per week. He reports that he does not use drugs. family history includes Birth defects in his paternal aunt; Cancer in his mother; Heart disease in his brother; Pneumonia in his father. Allergies  Allergen Reactions   Dorzolamide Anaphylaxis and Dermatitis    Irritation on upper lids    Acetazolamide Diarrhea    Lost weight also Lost weight also    Bimatoprost Dermatitis and Other (See Comments)    Unknown Unknown    Brimonidine Other (See Comments)    Unknown Unknown    Brimonidine Tartrate-Timolol    Sulfa Antibiotics    Sulfamethoxazole Hives  Dorzolamide Hcl-Timolol Mal Rash    itching itching    Erythromycin Rash   Penicillins Rash   Sulfonamide Derivatives Rash    Review of Systems  Constitutional:  Negative for malaise/fatigue.  Eyes:  Negative for blurred vision.  Respiratory:  Negative for shortness of breath.   Cardiovascular:  Negative for chest pain.  Gastrointestinal:  Negative for abdominal pain.  Genitourinary:  Negative for dysuria.  Neurological:  Negative for dizziness, weakness and headaches.      Objective:     BP 132/70 (BP Location: Left Arm, Patient Position: Sitting, Cuff Size: Normal)    Pulse 60   Temp 98 F (36.7 C) (Oral)   Ht '5\' 10"'$  (1.778 m)   Wt 189 lb 12.8 oz (86.1 kg)   SpO2 98%   BMI 27.23 kg/m  BP Readings from Last 3 Encounters:  02/02/22 132/70  01/25/22 126/88  01/01/22 (!) 142/80   Wt Readings from Last 3 Encounters:  02/02/22 189 lb 12.8 oz (86.1 kg)  01/25/22 195 lb 9.6 oz (88.7 kg)  01/01/22 191 lb 14.4 oz (87 kg)      Physical Exam Constitutional:      Appearance: He is well-developed.  HENT:     Head:     Comments: Left eardrum is normal.  Right eardrum reveals small serous effusion along the inferior portion.  No erythema.  No purulence. Eyes:     Pupils: Pupils are equal, round, and reactive to light.  Neck:     Thyroid: No thyromegaly.  Cardiovascular:     Rate and Rhythm: Normal rate and regular rhythm.  Pulmonary:     Effort: Pulmonary effort is normal. No respiratory distress.     Breath sounds: Normal breath sounds. No wheezing or rales.  Musculoskeletal:     Cervical back: Neck supple.     Right lower leg: No edema.     Left lower leg: No edema.  Skin:    Comments: Scalp examined.  Large incision from recent surgery for removal of squamous cell carcinoma this appears to be healing well.  No signs of secondary infection.  Neurological:     Mental Status: He is alert and oriented to person, place, and time.      No results found for any visits on 02/02/22.    The ASCVD Risk score (Arnett DK, et al., 2019) failed to calculate for the following reasons:   The 2019 ASCVD risk score is only valid for ages 62 to 64    Assessment & Plan:   #1 otitis media with effusion right ear.  Recommend observation for now.  Explained limited utility of medications for treating.  Be in touch if not improving to 3 months consider ENT referral  #2 hypertension stable and well-controlled.  Continue amlodipine and benazepril HCTZ.  Has mild dry cough (chronic) and ? Related to ACE.   If dry cough becomes more bothersome consider change to  angiotensin receptor blocker  #3 anxiety symptoms.  Currently on Lexapro 10 mg daily.  Minimal benefit.  Consider titration to 1-1/2 tablets or 15 mg once daily  No follow-ups on file.    Carolann Littler, MD

## 2022-02-02 NOTE — Patient Instructions (Signed)
IF cough persists, consider discussing with [facility] change from Benazepril (ACE inhibitor) to angiotensin receptor blocker.

## 2022-02-10 ENCOUNTER — Other Ambulatory Visit: Payer: Self-pay

## 2022-02-27 ENCOUNTER — Encounter: Payer: Self-pay | Admitting: Hematology and Oncology

## 2022-03-07 ENCOUNTER — Ambulatory Visit: Payer: Medicare HMO | Admitting: Podiatry

## 2022-03-07 ENCOUNTER — Encounter: Payer: Self-pay | Admitting: Podiatry

## 2022-03-07 DIAGNOSIS — L84 Corns and callosities: Secondary | ICD-10-CM

## 2022-03-07 DIAGNOSIS — L600 Ingrowing nail: Secondary | ICD-10-CM

## 2022-03-07 DIAGNOSIS — D689 Coagulation defect, unspecified: Secondary | ICD-10-CM | POA: Diagnosis not present

## 2022-03-07 DIAGNOSIS — M7751 Other enthesopathy of right foot: Secondary | ICD-10-CM | POA: Diagnosis not present

## 2022-03-07 MED ORDER — TRIAMCINOLONE ACETONIDE 10 MG/ML IJ SUSP
10.0000 mg | Freq: Once | INTRAMUSCULAR | Status: AC
  Administered 2022-03-07: 10 mg

## 2022-03-07 NOTE — Patient Instructions (Signed)

## 2022-03-07 NOTE — Progress Notes (Signed)
Subjective:   Patient ID: Charles Li, male   DOB: 81 y.o.   MRN: [id number]   HPI Patient presents stating that he has an ingrown toenail on the right second toe and chronic lesions underneath the left fifth metatarsal the right fifth metatarsal and the right second metatarsal.  Also has inflammation fluid around the right fifth metatarsal that was treated around 6 months ago does not smoke likes to be active   Review of Systems  All other systems reviewed and are negative.       Objective:  Physical Exam Vitals and nursing note reviewed.  Constitutional:      Appearance: He is well-developed.  Pulmonary:     Effort: Pulmonary effort is normal.  Musculoskeletal:        General: Normal range of motion.  Skin:    General: Skin is warm.  Neurological:     Mental Status: He is alert.     Neurovascular status intact muscle strength found to be adequate range of motion within normal limits.  Patient is found to have incurvated lateral border right second nailbed painful when pressed inflammation fluid buildup around the fifth MPJ right and lesion formation bilateral that are painful when pressed and make shoe gear difficult.  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  I recommended correction of the ingrown toenail patient wants this done and I allowed him to read then signed consent form understanding procedure risk and I infiltrated the right second toe 60 mg like Marcaine mixture sterile prep done using sterile instrumentation remove the lateral border exposed matrix applied phenol 3 applications 30 seconds followed by alcohol lavage sterile dressing gave instructions on soaks and to leave dressing on 24 hours but take  it off earlier if throbbing were to occur.  Patient had sharp debridement of her done of 2 lesions and sterile prep and injected the fifth MPJ plantar 3 mg Dexasone Kenalog 5 mg Xylocaine and discussed the possibility long-term for head resection if symptoms persist

## 2022-04-23 DIAGNOSIS — L738 Other specified follicular disorders: Secondary | ICD-10-CM | POA: Diagnosis not present

## 2022-04-23 DIAGNOSIS — D044 Carcinoma in situ of skin of scalp and neck: Secondary | ICD-10-CM | POA: Diagnosis not present

## 2022-04-23 DIAGNOSIS — D485 Neoplasm of uncertain behavior of skin: Secondary | ICD-10-CM | POA: Diagnosis not present

## 2022-05-30 ENCOUNTER — Other Ambulatory Visit: Payer: Self-pay | Admitting: Family Medicine

## 2022-05-30 NOTE — Telephone Encounter (Addendum)
Last refill sent in by historical provider Patient last picked up script on 05/01/22 Last OV-02/02/22  No future OV scheduled.    Okay for refill

## 2022-07-30 ENCOUNTER — Other Ambulatory Visit: Payer: Self-pay | Admitting: Hematology and Oncology

## 2022-07-30 ENCOUNTER — Inpatient Hospital Stay: Payer: Medicare HMO | Attending: Hematology and Oncology

## 2022-07-30 ENCOUNTER — Other Ambulatory Visit: Payer: Self-pay

## 2022-07-30 ENCOUNTER — Inpatient Hospital Stay: Payer: Medicare HMO | Admitting: Hematology and Oncology

## 2022-07-30 VITALS — BP 158/104 | HR 63 | Temp 97.9°F | Resp 16 | Wt 195.6 lb

## 2022-07-30 DIAGNOSIS — C8331 Diffuse large B-cell lymphoma, lymph nodes of head, face, and neck: Secondary | ICD-10-CM

## 2022-07-30 LAB — CMP (CANCER CENTER ONLY)
ALT: 12 U/L (ref 0–44)
AST: 17 U/L (ref 15–41)
Albumin: 4.1 g/dL (ref 3.5–5.0)
Alkaline Phosphatase: 65 U/L (ref 38–126)
Anion gap: 6 (ref 5–15)
BUN: 21 mg/dL (ref 8–23)
CO2: 32 mmol/L (ref 22–32)
Calcium: 10 mg/dL (ref 8.9–10.3)
Chloride: 103 mmol/L (ref 98–111)
Creatinine: 0.97 mg/dL (ref 0.61–1.24)
GFR, Estimated: >60 mL/min (ref >60)
Glucose, Bld: 103 mg/dL — ABNORMAL HIGH (ref 70–99)
Potassium: 3.8 mmol/L (ref 3.5–5.1)
Sodium: 141 mmol/L (ref 135–145)
Total Bilirubin: 0.6 mg/dL (ref 0.3–1.2)
Total Protein: 6.5 g/dL (ref 6.5–8.1)

## 2022-07-30 LAB — CBC WITH DIFFERENTIAL (CANCER CENTER ONLY)
Abs Immature Granulocytes: 0.03 10*3/uL (ref 0.00–0.07)
Basophils Absolute: 0 10*3/uL (ref 0.0–0.1)
Basophils Relative: 1 %
Eosinophils Absolute: 0.4 10*3/uL (ref 0.0–0.5)
Eosinophils Relative: 5 %
HCT: 43.9 % (ref 39.0–52.0)
Hemoglobin: 15.6 g/dL (ref 13.0–17.0)
Immature Granulocytes: 0 %
Lymphocytes Relative: 21 %
Lymphs Abs: 1.8 10*3/uL (ref 0.7–4.0)
MCH: 33.4 pg (ref 26.0–34.0)
MCHC: 35.5 g/dL (ref 30.0–36.0)
MCV: 94 fL (ref 80.0–100.0)
Monocytes Absolute: 0.7 10*3/uL (ref 0.1–1.0)
Monocytes Relative: 8 %
Neutro Abs: 5.7 10*3/uL (ref 1.7–7.7)
Neutrophils Relative %: 65 %
Platelet Count: 237 10*3/uL (ref 150–400)
RBC: 4.67 MIL/uL (ref 4.22–5.81)
RDW: 12.7 % (ref 11.5–15.5)
WBC Count: 8.7 10*3/uL (ref 4.0–10.5)
nRBC: 0 % (ref 0.0–0.2)

## 2022-07-30 LAB — LACTATE DEHYDROGENASE: LDH: 147 U/L (ref 98–192)

## 2022-07-30 NOTE — Progress Notes (Signed)
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 Lucia Li for tonsillar mass present for a few weeks. 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 FISH studies show MYC Rearrangement, negative BCL-2 and BCL-6 2) 08/17/2019: establish care with Dr. Lorenso Courier   3) 08/28/2019: PET CT scan reveals activity in left lingual tonsil, level 2 lymph node, and level 3 lymph node. Consistent with Stage I 4) 09/04/2019: Bone marrow biopsy shows no evidence of lymphoma in the bone marrow.  5) 09/04/2019; TTE showed EF 60-65%, however patient found to have new atrial fibrillation.  6) 09/14/2019: started R-CEOP chemotherapy, Cycle 1 Day 1 7) 10/05/2019: R-CEOP chemotherapy, Cycle 2 Day 1 8) 10/26/2019: R-CEOP chemotherapy Cycle 3 Day 1 9) 11/30/2019- 12/23/2019: Radiation therapy with Dr. Isidore Moos  HISTORY OF PRESENTING ILLNESS:  Charles Li 82 y.o. male with medical history significant for left tonsillar B cell lymphoma presents for f/u. He was last seen on 01/25/2022. 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  On exam today Charles Li notes he did have a recent fall where he was walking in the bug garden and missed a step.  He reports he landed on his left side hitting a rail.  He notes that he had the "wind knocked out of him".  He notes his energy levels have been quite strong though not as good  as he would like.  He notes he has been eating "too much".  He notes that he does have some hypertension today but does not have any lightheadedness, dizziness, or headache.  He reports that he does not have any concern for bumps or lumps in his neck, under his arms, or elsewhere.  He notes overall he feels quite well.  He otherwise denies having issues with fevers, chills, sweats, nausea, vomiting or diarrhea.  He denies having any new lymphadenopathy, weakness, shortness of breath, or fatigue.  A full 10 point ROS is listed below.  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  MEDICATIONS:  Current Outpatient Medications  Medication Sig Dispense Refill   amLODipine (NORVASC) 5 MG tablet Take by mouth.     apixaban (ELIQUIS) 5 MG TABS tablet Take 1 tablet (5 mg total)  by mouth 2 (two) times daily. 180 tablet 3   benazepril-hydrochlorthiazide (LOTENSIN HCT) 20-12.5 MG tablet Take 1 tablet by mouth daily. 30 tablet 0   dorzolamide-timolol (COSOPT) 22.3-6.8 MG/ML ophthalmic solution Place 1 drop into both eyes 2 (two) times daily.     escitalopram (LEXAPRO) 10 MG tablet TAKE 1 TABLET BY MOUTH  EVERY DAY 90 tablet 1   Multiple Vitamin (MULTIVITAMIN WITH MINERALS) TABS tablet Take 1 tablet by mouth daily.     Netarsudil-Latanoprost (ROCKLATAN) 0.02-0.005 % SOLN Place 1 drop into both eyes at bedtime.      pravastatin (PRAVACHOL) 40 MG tablet Take 1 tablet (40 mg total) by mouth daily. Please schedule an appointment for further refills. [phone] (Patient taking differently: Take 40 mg by mouth every evening. Please schedule an appointment for further refills. [phone]) 90 tablet 3   No current facility-administered medications for this visit.    REVIEW OF SYSTEMS:   Constitutional: ( - ) fevers, ( - )  chills , ( - ) night sweats (+) hair loss Eyes: ( - ) blurriness of vision, ( - ) double vision, ( - ) watery eyes Ears, nose, mouth, throat, and face: ( - ) mucositis, ( + ) sore throat Respiratory: ( - ) cough, ( - ) dyspnea, ( - ) wheezes Cardiovascular: ( - ) palpitation, ( - ) chest discomfort, ( - ) lower extremity swelling Gastrointestinal:  ( - ) nausea, ( - ) heartburn, ( - ) change in bowel habits Skin: ( - ) abnormal skin rashes Lymphatics: ( - ) new lymphadenopathy, ( - ) easy bruising Neurological: ( - ) numbness, ( - ) tingling, ( - ) new weaknesses Behavioral/Psych: ( - ) mood change, ( - ) new changes  All other systems were reviewed with the patient and are negative.  PHYSICAL EXAMINATION: ECOG PERFORMANCE STATUS: 1 - Symptomatic but completely ambulatory  Vitals:   07/30/22 1436  BP: (!) 158/104  Pulse: 63  Resp: 16  Temp: 97.9 F (36.6 C)  SpO2: 97%     Filed Weights   07/30/22 1436  Weight: 195 lb 9.6 oz (88.7 kg)      GENERAL: well appearing elderly Caucasian male in NAD SKIN: skin color, texture, turgor are normal, no rashes or significant lesions.  OROPHARYNX: no swelling, erythema, enlarged tonsils or new lesions.  EYES: conjunctiva are pink and non-injected, sclera clear LUNGS: clear to auscultation and percussion with normal  breathing effort HEART: regular rate & rhythm and no murmurs and no lower extremity edema Musculoskeletal: no cyanosis of digits and no clubbing  PSYCH: alert & oriented x 3, fluent speech NEURO: no focal motor/sensory deficits  LABORATORY DATA:  I have reviewed the data as listed    Latest Ref Rng & Units 07/30/2022    2:11 PM 01/25/2022    1:59 PM 07/26/2021    1:56 PM  CBC  WBC 4.0 - 10.5 K/uL 8.7  9.6  9.5   Hemoglobin 13.0 - 17.0 g/dL 15.6  14.8  15.7   Hematocrit 39.0 - 52.0 % 43.9  42.2  43.6   Platelets 150 - 400 K/uL 237  249  216        Latest Ref Rng & Units 07/30/2022    2:11 PM 01/25/2022    1:59 PM 07/26/2021    1:56 PM  CMP  Glucose 70 - 99 mg/dL 103  106  114   BUN 8 - 23 mg/dL 21  22  21  Creatinine 0.61 - 1.24 mg/dL 0.97  0.94  1.02   Sodium 135 - 145 mmol/L 141  138  138   Potassium 3.5 - 5.1 mmol/L 3.8  3.8  4.1   Chloride 98 - 111 mmol/L 103  100  100   CO2 22 - 32 mmol/L 32  32  31   Calcium 8.9 - 10.3 mg/dL 10.0  9.9  9.9   Total Protein 6.5 - 8.1 g/dL 6.5  6.5  7.2   Total Bilirubin 0.3 - 1.2 mg/dL 0.6  0.7  0.9   Alkaline Phos 38 - 126 U/L 65  54  54   AST 15 - 41 U/L '17  19  19   '$ ALT 0 - 44 U/L '12  19  16     '$ PATHOLOGY:  SURGICAL PATHOLOGY  CASE: [id number]  PATIENT: [place]  Surgical Pathology Report   Clinical History: tonsil cancer (cm)   FINAL MICROSCOPIC DIAGNOSIS:   A. TONSIL, LEFT, BIOPSY:  -  Large B-cell lymphoma  -  See comment   COMMENT:  The tonsillar tissue has an ulcerated surface with underlying atypical  cellular proliferation.  By immunohistochemistry, the neoplastic cells  are positive for CD20, CD10, BCL6 and BCL2 (weak) but negative for CD3,  CD56, CD5, Mum-1, cytokeratin AE1/3 and EBV by in situ hybridization.  The proliferative rate by Ki-67 is 70 to 80%.  Overall, the features are  consistent with a large B-cell lymphoma.  The differential diagnosis  includes diffuse large B-cell lymphoma and  high-grade B-cell lymphoma  (double hit).  FISH is pending and will be reported in an addendum.   Preliminary results of this case were given to Dr. Lucia Li on August 14, 2019.   Dr. Tresa Moore reviewed the case and agrees with the above diagnosis.   GROSS DESCRIPTION:   The specimen is received in formalin and consists of a 0.9 x 0.4 x 0.4  cm piece of tan soft tissue.  The specimen is entirely submitted in 1  cassette. Craig Staggers 08/14/2019)   Final Diagnosis performed by Thressa Sheller, MD.   Electronically signed  08/14/2019  Technical and / or Professional components performed at [facility]. [facility], [place] [address], [place], [place] [address].   Immunohistochemistry Technical component (if applicable) was performed  at [facility]. [address], [place],  [place],  [address].   IMMUNOHISTOCHEMISTRY DISCLAIMER (if applicable):  Some of these immunohistochemical stains may have been developed and the  performance characteristics determine by [facility]. Some  may not have been cleared or approved by the U.S. Food and Drug  Administration. The FDA has determined that such clearance or approval  is not necessary. This test is used for clinical purposes. It should not  be regarded as investigational or for research. This laboratory is  certified under the [place]  (CLIA-88) as qualified to perform high complexity clinical laboratory  testing.  The controls stained appropriately.  SURGICAL PATHOLOGY  ** THIS IS AN ADDENDUM REPORT **  CASE: [id number]  PATIENT: Charles Li  Bone Marrow Report  **Addendum **   Reason for Addendum #1:  Cytogenetics results   Clinical History: lymphoma, DLBCL, left posterior iliac, (ADC)    DIAGNOSIS:   BONE MARROW, ASPIRATE, CLOT, CORE:  -  Normocellular marrow with trilineage hematopoiesis  -  No lymphoma identified   PERIPHERAL BLOOD:  -   Morphologically unremarkable  -  See complete  blood cell count   MICROSCOPIC DESCRIPTION:   PERIPHERAL BLOOD SMEAR: The peripheral blood is morphologically  unremarkable.   BONE MARROW ASPIRATE: Spicular, cellular and adequate for evaluation  Erythroid precursors: Orderly maturation without overt dysplasia  Granulocytic precursors: Orderly maturation without overt dysplasia  Megakaryocytes: Qualitatively and quantitatively unremarkable  Lymphocytes/plasma cells: No lymphocytosis or plasmacytosis   TOUCH PREPARATIONS: Hypocellular with no additional findings as compared  to the aspirate smears.   CLOT AND BIOPSY: Examination of the core biopsy and clot section reveals  a normocellular bone marrow (30-40%) with trilineage hematopoiesis.  Myeloid and erythroid elements are present in essentially normal  proportions. Megakaryocytes display a spectrum of maturation without  clustering. There are no granulomas identified. A single well-formed  lymphoid aggregate is noted on the clot section. By  immunohistochemistry, is composed of an admixture of B and T cells  (CD20, CD3) without aberrant B-cell expression of CD5 or CD10.   IRON STAIN: Iron stains are performed on a bone marrow aspirate or touch  imprint smear and section of clot. The controls stained appropriately.        Storage Iron: Present       Ring Sideroblasts: Not identified   ADDITIONAL DATA/TESTING: Cytogenetics is pending and will be reported  separately. Flow cytometry performed on the bone marrow biopsy did not  identify a monoclonal B-cell or phenotypically aberrant T-cell  population (See [id number]).   CELL COUNT DATA:   Bone Marrow count performed on 500 cells shows:  Blasts:   0%   Myeloid:  57%  Promyelocytes: 0%   Erythroid:     28%  Myelocytes:    10%  Lymphocytes:   7%  Metamyelocytes:     2%   Plasma cells:  8%  Bands:    7%  Neutrophils:   33%  M:E ratio:     2.03  Eosinophils:   5%  Basophils:      0%  Monocytes:     0%   Lab Data: CBC performed on 09/04/2019 shows:  WBC: 8.6 k/uL  Neutrophils:   63%  Hgb: 14.2 g/dL Lymphocytes:   30%  HCT: 41.2 %    Monocytes:     3%  MCV: 94.7 fL   Eosinophils:   4%  RDW: 11.8 %    Basophils:     0%  PLT: 263 k/uL   GROSS DESCRIPTION:   A: Aspirate smear   B: Received in B-plus fixative are tissue fragments measuring 2.1 x 1.0  x 0.3 cm in aggregate.  The specimen is submitted in total.   C: Received in B-plus fixative is a 2.0 x 0.2 cm core of bone which is  submitted in toto fine decalcification.  [facility] 09/04/2019)    Final Diagnosis performed by Thressa Sheller, MD.   Electronically signed  09/08/2019  Technical component performed at [facility]  [address], [place], [place] [address].   Professional component performed at [employer]. [facility],  [place] [address] [place], [place] [address].   Immunohistochemistry Technical component (if applicable) was performed  at [facility]. [address], [place],  [place], [place] [address].   IMMUNOHISTOCHEMISTRY DISCLAIMER (if applicable):  Some of these immunohistochemical stains may have been developed and the  performance characteristics determine by [facility]. Some  may not have been cleared or approved by the U.S. Food and Drug  Administration. The FDA has determined that such clearance or approval  is  not necessary. This test is used for clinical purposes. It should not  be regarded as investigational or for research. This laboratory is  certified under the [place]  (CLIA-88) as qualified to perform high complexity clinical laboratory  testing.  The controls stained appropriately.   ADDENDUM:   ** Please note this testing was performed and interpreted by an outside  facility.  This addendum is only being added to provide a summary of the  results for report completeness.  Please  see electronic medical record  for a copy of the full report. **   Karyotype: 47,?XY,?del(?9)?(?q13q22)?,?+?mar[?3]?/?46,?XY[?20]?  Interpretation:  ABNORMAL MALE KARYOTYPE   Cytogenetic analysis shows an abnormal male karyotype. 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 The remaining twenty cells show a normal  karyotype.   Deletion of chromosome 9q occurs in both myeloid (more common) and  lymphoid disorders, including acute myeloid leukemia (AML) (more  commonly) and myelodysplastic syndrome (MDS) (only rarely). In MDS the  prognostic significance would be expected to be intermediate.   In AML  the prognosis is variable.  Marker chromosomes and structural  chromosomal aberrations in the absence of autosomal monosomies have  minimal prognostic impact. Correlation with other clinical and  laboratory findings is indicated.    RADIOGRAPHIC STUDIES:  No results found.  ASSESSMENT & PLAN Charles Li 82 y.o. male with medical history significant for left tonsillar diffuse B cell lymphoma in remission who presents for f/u.   In the interim since his last visit the patient has been well.  He is at his baseline health with no signs/symptoms of recurrence.  He is not having any new or concerning symptoms at this time. Review of Charles Li's PET CT scan from 11/17/2019 is reassuring with complete resolution of FDG avidity in the involved lymph nodes.   The treatment plan was for Rituximab 375 mg/m2, Cyclophosphamide '750mg'$ /m2, etoposide IV '50mg'$ /m2 (followed by '100mg'$ /m2 PO on Day 2 and 3), and Vincristine 1.4 mg/m2 all on Day 1. The patient will also received PO prednisone '100mg'$  Day 1-5 of each 3 week cycle. Patient recieved ISRT with Dr. Isidore Moos, completed on 12/23/2019.   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 Bone marrow biopsy confirmed no alternative sites of lymphoma.  -- started R-CEOP chemotherapy on 09/14/2019. This is therapy with curative intent. Etoposide was substituted for anthracycline therapy due to his atrial fibrillation found on TTE.  -- At this time his findings are consistent with a Stage I. Patient completed 3 cycles of R-CEOP with ISRT to site of disease. Interval PET scan on 11/17/2019 showed no active disease. Patient completed radiation therapy on 12/23/2019. Plan: --routine follow up imaging can be performed as clinically indicated.   --Labs today show white blood cell count 8.7, hemoglobin 15.6, MCV 94, and platelets of 237. --RTC in 6 months for continued post treatment surveillance.  At next visit we will transition to every 12 months.  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 We were agreeable to deferring evaluation to a later time.  --kidney function is stable and he has no urinary/pain symptoms --defer management urology service.   #Atrial Fibrillation --currently following with cardiology, underwent cardioversion on 12/16/2019.  --no  bleeding, bruising, or other issues on Eliquis.   No orders of the defined types were placed in this encounter.   All questions were answered. The patient knows to call the clinic with any problems, questions or concerns.  A total of more than 30 minutes were spent on this encounter and over half of that time was spent on counseling and coordination of care as outlined above.   Ledell Peoples, MD Department of Hematology/Oncology [place] at [facility] Phone: [phone] Pager: [phone] Email: [email or web address]  07/30/2022 4:04 PM

## 2022-07-31 ENCOUNTER — Telehealth: Payer: Self-pay | Admitting: Hematology and Oncology

## 2022-07-31 NOTE — Telephone Encounter (Signed)
Called patient per 1/22 los notes to schedule f/u. Patient scheduled and notified.

## 2022-08-16 ENCOUNTER — Encounter (HOSPITAL_COMMUNITY): Payer: Self-pay | Admitting: *Deleted

## 2022-12-05 DIAGNOSIS — L570 Actinic keratosis: Secondary | ICD-10-CM | POA: Diagnosis not present

## 2022-12-05 DIAGNOSIS — L905 Scar conditions and fibrosis of skin: Secondary | ICD-10-CM | POA: Diagnosis not present

## 2022-12-05 DIAGNOSIS — L821 Other seborrheic keratosis: Secondary | ICD-10-CM | POA: Diagnosis not present

## 2022-12-05 DIAGNOSIS — Z85828 Personal history of other malignant neoplasm of skin: Secondary | ICD-10-CM | POA: Diagnosis not present

## 2022-12-18 ENCOUNTER — Encounter: Payer: Self-pay | Admitting: Hematology and Oncology

## 2023-01-28 ENCOUNTER — Other Ambulatory Visit: Payer: Self-pay | Admitting: Hematology and Oncology

## 2023-01-28 ENCOUNTER — Inpatient Hospital Stay: Payer: Medicare HMO | Attending: Hematology and Oncology

## 2023-01-28 ENCOUNTER — Inpatient Hospital Stay (HOSPITAL_BASED_OUTPATIENT_CLINIC_OR_DEPARTMENT_OTHER): Payer: Medicare HMO | Admitting: Hematology and Oncology

## 2023-01-28 ENCOUNTER — Other Ambulatory Visit: Payer: Self-pay

## 2023-01-28 VITALS — BP 161/95 | HR 61 | Temp 97.7°F | Resp 13 | Wt 198.4 lb

## 2023-01-28 DIAGNOSIS — N200 Calculus of kidney: Secondary | ICD-10-CM | POA: Insufficient documentation

## 2023-01-28 DIAGNOSIS — N1330 Unspecified hydronephrosis: Secondary | ICD-10-CM | POA: Diagnosis not present

## 2023-01-28 DIAGNOSIS — C8331 Diffuse large B-cell lymphoma, lymph nodes of head, face, and neck: Secondary | ICD-10-CM | POA: Insufficient documentation

## 2023-01-28 DIAGNOSIS — I4891 Unspecified atrial fibrillation: Secondary | ICD-10-CM | POA: Diagnosis not present

## 2023-01-28 LAB — CMP (CANCER CENTER ONLY)
ALT: 13 U/L (ref 0–44)
AST: 17 U/L (ref 15–41)
Albumin: 4.1 g/dL (ref 3.5–5.0)
Alkaline Phosphatase: 51 U/L (ref 38–126)
Anion gap: 4 — ABNORMAL LOW (ref 5–15)
BUN: 20 mg/dL (ref 8–23)
CO2: 34 mmol/L — ABNORMAL HIGH (ref 22–32)
Calcium: 10.2 mg/dL (ref 8.9–10.3)
Chloride: 101 mmol/L (ref 98–111)
Creatinine: 0.93 mg/dL (ref 0.61–1.24)
GFR, Estimated: >60 mL/min (ref >60)
Glucose, Bld: 126 mg/dL — ABNORMAL HIGH (ref 70–99)
Potassium: 4.8 mmol/L (ref 3.5–5.1)
Sodium: 139 mmol/L (ref 135–145)
Total Bilirubin: 0.8 mg/dL (ref 0.3–1.2)
Total Protein: 6.3 g/dL — ABNORMAL LOW (ref 6.5–8.1)

## 2023-01-28 LAB — CBC WITH DIFFERENTIAL (CANCER CENTER ONLY)
Abs Immature Granulocytes: 0.04 10*3/uL (ref 0.00–0.07)
Basophils Absolute: 0.1 10*3/uL (ref 0.0–0.1)
Basophils Relative: 1 %
Eosinophils Absolute: 0.2 10*3/uL (ref 0.0–0.5)
Eosinophils Relative: 3 %
HCT: 42.4 % (ref 39.0–52.0)
Hemoglobin: 15.1 g/dL (ref 13.0–17.0)
Immature Granulocytes: 1 %
Lymphocytes Relative: 22 %
Lymphs Abs: 1.9 10*3/uL (ref 0.7–4.0)
MCH: 33.9 pg (ref 26.0–34.0)
MCHC: 35.6 g/dL (ref 30.0–36.0)
MCV: 95.3 fL (ref 80.0–100.0)
Monocytes Absolute: 0.8 10*3/uL (ref 0.1–1.0)
Monocytes Relative: 9 %
Neutro Abs: 5.8 10*3/uL (ref 1.7–7.7)
Neutrophils Relative %: 64 %
Platelet Count: 220 10*3/uL (ref 150–400)
RBC: 4.45 MIL/uL (ref 4.22–5.81)
RDW: 12.6 % (ref 11.5–15.5)
WBC Count: 8.8 10*3/uL (ref 4.0–10.5)
nRBC: 0 % (ref 0.0–0.2)

## 2023-01-28 LAB — LACTATE DEHYDROGENASE: LDH: 144 U/L (ref 98–192)

## 2023-01-28 NOTE — Progress Notes (Signed)
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 Ezzard Standing for tonsillar mass present for a few weeks. 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 FISH studies show MYC Li, Charles Li 2) 08/17/2019: establish care with Dr. Leonides Schanz   3) 08/28/2019: PET CT scan reveals activity in left lingual tonsil, level 2 lymph node, and level 3 lymph node. Consistent with Stage I 4) 09/04/2019: Bone marrow biopsy shows no evidence of lymphoma in the bone marrow.  5) 09/04/2019; TTE showed EF 60-65%, however patient found to have new atrial fibrillation.  6) 09/14/2019: started R-CEOP chemotherapy, Cycle 1 Day 1 7) 10/05/2019: R-CEOP chemotherapy, Cycle 2 Day 1 8) 10/26/2019: R-CEOP chemotherapy Cycle 3 Day 1 9) 11/30/2019- 12/23/2019: Radiation therapy with Dr. Basilio Cairo  HISTORY OF PRESENTING ILLNESS:  Charles Li 82 y.o. male with medical history significant for left tonsillar B cell lymphoma presents for f/u. He was last seen on 07/30/2022. In the interim he has had no major changes in his health.  On exam today Charles Li notes he has been well overall in the interim since her last visit.  He is disheartened by the fact that his weight has increased.  His weight is 198 pounds and he is up almost 10 pounds from this time last year.  He has had no other major changes in his health.  He was recently fitted for  orthotics and did not follow instructions properly which caused some pain discomfort and swelling in his right knee.  He notes his energy levels are good but feels like he is being held back by the increase in weight.  He reports no recent illnesses such as runny nose, sore throat, or cough.  He otherwise denies having issues with fevers, chills, sweats, nausea, vomiting or diarrhea.  He denies having any new lymphadenopathy, weakness, shortness of breath, or fatigue.  A full 10 point ROS is listed below.  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  MEDICATIONS:  Current Outpatient Medications  Medication Sig Dispense Refill   amLODipine (NORVASC) 5 MG tablet Take by mouth.     apixaban (ELIQUIS) 5 MG TABS tablet Take 1 tablet (5 mg total) by mouth 2 (two) times daily. 180 tablet 3  benazepril-hydrochlorthiazide (LOTENSIN HCT) 20-12.5 MG tablet Take 1 tablet by mouth daily. 30 tablet 0   dorzolamide-timolol (COSOPT) 22.3-6.8 MG/ML ophthalmic solution Place 1 drop into both eyes 2 (two) times daily.     escitalopram (LEXAPRO) 10 MG tablet TAKE 1 TABLET BY MOUTH EVERY DAY 90 tablet 1    Multiple Vitamin (MULTIVITAMIN WITH MINERALS) TABS tablet Take 1 tablet by mouth daily.     Netarsudil-Latanoprost (ROCKLATAN) 0.02-0.005 % SOLN Place 1 drop into both eyes at bedtime.      pravastatin (PRAVACHOL) 40 MG tablet Take 1 tablet (40 mg total) by mouth daily. Please schedule an appointment for further refills. [phone] (Patient taking differently: Take 40 mg by mouth every evening. Please schedule an appointment for further refills. [phone]) 90 tablet 3   No current facility-administered medications for this visit.    REVIEW OF SYSTEMS:   Constitutional: ( - ) fevers, ( - )  chills , ( - ) night sweats (+) hair loss Eyes: ( - ) blurriness of vision, ( - ) double vision, ( - ) watery eyes Ears, nose, mouth, throat, and face: ( - ) mucositis, ( + ) sore throat Respiratory: ( - ) cough, ( - ) dyspnea, ( - ) wheezes Cardiovascular: ( - ) palpitation, ( - ) chest discomfort, ( - ) lower extremity swelling Gastrointestinal:  ( - ) nausea, ( - ) heartburn, ( - ) change in bowel habits Skin: ( - ) abnormal skin rashes Lymphatics: ( - ) new lymphadenopathy, ( - ) easy bruising Neurological: ( - ) numbness, ( - ) tingling, ( - ) new weaknesses Behavioral/Psych: ( - ) mood change, ( - ) new changes  All other systems were reviewed with the patient and are Charles.  PHYSICAL EXAMINATION: ECOG PERFORMANCE STATUS: 1 - Symptomatic but completely ambulatory  There were no vitals filed for this visit.    There were no vitals filed for this visit.     GENERAL: well appearing elderly Caucasian male in NAD SKIN: skin color, texture, turgor are normal, no rashes or significant lesions.  OROPHARYNX: no swelling, erythema, enlarged tonsils or new lesions.  EYES: conjunctiva are pink and non-injected, sclera clear LUNGS: clear to auscultation and percussion with normal breathing effort HEART: regular rate & rhythm and no murmurs and no lower extremity edema Musculoskeletal: no  cyanosis of digits and no clubbing  PSYCH: alert & oriented x 3, fluent speech NEURO: no focal motor/sensory deficits  LABORATORY DATA:  I have reviewed the data as listed    Latest Ref Rng & Units 01/28/2023    3:03 PM 07/30/2022    2:11 PM 01/25/2022    1:59 PM  CBC  WBC 4.0 - 10.5 K/uL 8.8  8.7  9.6   Hemoglobin 13.0 - 17.0 g/dL 29.5  62.1  30.8   Hematocrit 39.0 - 52.0 % 42.4  43.9  42.2   Platelets 150 - 400 K/uL 220  237  249        Latest Ref Rng & Units 07/30/2022    2:11 PM 01/25/2022    1:59 PM 07/26/2021    1:56 PM  CMP  Glucose 70 - 99 mg/dL 657  846  962   BUN 8 - 23 mg/dL 21  22  21    Creatinine 0.61 - 1.24 mg/dL 9.52  8.41  3.24   Sodium 135 - 145 mmol/L 141  138  138   Potassium 3.5 - 5.1 mmol/L 3.8  3.8  4.1  Chloride 98 - 111 mmol/L 103  100  100   CO2 22 - 32 mmol/L 32  32  31   Calcium 8.9 - 10.3 mg/dL 11.9  9.9  9.9   Total Protein 6.5 - 8.1 g/dL 6.5  6.5  7.2   Total Bilirubin 0.3 - 1.2 mg/dL 0.6  0.7  0.9   Alkaline Phos 38 - 126 U/L 65  54  54   AST 15 - 41 U/L 17  19  19    ALT 0 - 44 U/L 12  19  16      PATHOLOGY:  SURGICAL PATHOLOGY  CASE: [id number]  PATIENT: Charles Li  Surgical Pathology Report   Clinical History: tonsil cancer (cm)   FINAL MICROSCOPIC DIAGNOSIS:   A. TONSIL, LEFT, BIOPSY:  -  Large B-cell lymphoma  -  See comment   COMMENT:  The tonsillar tissue has an ulcerated surface with underlying atypical  cellular proliferation.  By immunohistochemistry, the neoplastic cells  are positive for CD20, CD10, BCL6 and BCL2 (weak) but Charles for CD3,  CD56, CD5, Mum-1, cytokeratin AE1/3 and EBV by in situ hybridization.  The proliferative rate by Ki-67 is 70 to 80%.  Overall, the features are  consistent with a large B-cell lymphoma.  The differential diagnosis  includes diffuse large B-cell lymphoma and high-grade B-cell lymphoma  (double hit).  FISH is pending and will be reported in an addendum.   Preliminary results  of this case were given to Dr. Ezzard Standing on August 14, 2019.   Dr. Berneice Heinrich reviewed the case and agrees with the above diagnosis.   GROSS DESCRIPTION:   The specimen is received in formalin and consists of a 0.9 x 0.4 x 0.4  cm piece of tan soft tissue.  The specimen is entirely submitted in 1  cassette. Lovey Newcomer 08/14/2019)   Final Diagnosis performed by Manning Charity, MD.   Electronically signed  08/14/2019  Technical and / or Professional components performed at [facility]. [facility], [address] [place], [place] [address].   Immunohistochemistry Technical component (if applicable) was performed  at [facility]. [address],  [place], [place] [address].   IMMUNOHISTOCHEMISTRY DISCLAIMER (if applicable):  Some of these immunohistochemical stains may have been developed and the  performance characteristics determine by [facility]. Some  may not have been cleared or approved by the U.S. Food and Drug  Administration. The FDA has determined that such clearance or approval  is not necessary. This test is used for clinical purposes. It should not  be regarded as investigational or for research. This laboratory is  certified under the Clinical Laboratory Improvement Amendments of 1988  (CLIA-88) as qualified to perform high complexity clinical laboratory  testing.  The controls stained appropriately.  SURGICAL PATHOLOGY  ** THIS IS AN ADDENDUM REPORT **  CASE: [id number]  PATIENT: Charles Li  Bone Marrow Report  **Addendum **   Reason for Addendum #1:  Cytogenetics results   Clinical History: lymphoma, DLBCL, left posterior iliac, (ADC)    DIAGNOSIS:   BONE MARROW, ASPIRATE, CLOT, CORE:  -  Normocellular marrow with trilineage hematopoiesis  -  No lymphoma identified   PERIPHERAL BLOOD:  -  Morphologically unremarkable  -  See complete blood cell count   MICROSCOPIC DESCRIPTION:   PERIPHERAL BLOOD SMEAR: The peripheral  blood is morphologically  unremarkable.   BONE MARROW ASPIRATE: Spicular, cellular and adequate for evaluation  Erythroid precursors: Orderly maturation without  overt dysplasia  Granulocytic precursors: Orderly maturation without overt dysplasia  Megakaryocytes: Qualitatively and quantitatively unremarkable  Lymphocytes/plasma cells: No lymphocytosis or plasmacytosis   TOUCH PREPARATIONS: Hypocellular with no additional findings as compared  to the aspirate smears.   CLOT AND BIOPSY: Examination of the core biopsy and clot section reveals  a normocellular bone marrow (30-40%) with trilineage hematopoiesis.  Myeloid and erythroid elements are present in essentially normal  proportions. Megakaryocytes display a spectrum of maturation without  clustering. There are no granulomas identified. A single well-formed  lymphoid aggregate is noted on the clot section. By  immunohistochemistry, is composed of an admixture of B and T cells  (CD20, CD3) without aberrant B-cell expression of CD5 or CD10.   IRON STAIN: Iron stains are performed on a bone marrow aspirate or touch  imprint smear and section of clot. The controls stained appropriately.        Storage Iron: Present       Ring Sideroblasts: Not identified   ADDITIONAL DATA/TESTING: Cytogenetics is pending and will be reported  separately. Flow cytometry performed on the bone marrow biopsy did not  identify a monoclonal B-cell or phenotypically aberrant T-cell  population (See [id number]).   CELL COUNT DATA:   Bone Marrow count performed on 500 cells shows:  Blasts:   0%   Myeloid:  57%  Promyelocytes: 0%   Erythroid:     28%  Myelocytes:    10%  Lymphocytes:   7%  Metamyelocytes:     2%   Plasma cells:  8%  Bands:    7%  Neutrophils:   33%  M:E ratio:     2.03  Eosinophils:   5%  Basophils:     0%  Monocytes:     0%   Lab Data: CBC performed on 09/04/2019 shows:  WBC: 8.6 k/uL  Neutrophils:   63%  Hgb: 14.2 g/dL  Lymphocytes:   10%  HCT: 41.2 %    Monocytes:     3%  MCV: 94.7 fL   Eosinophils:   4%  RDW: 11.8 %    Basophils:     0%  PLT: 263 k/uL   GROSS DESCRIPTION:   A: Aspirate smear   B: Received in B-plus fixative are tissue fragments measuring 2.1 x 1.0  x 0.3 cm in aggregate.  The specimen is submitted in total.   C: Received in B-plus fixative is a 2.0 x 0.2 cm core of bone which is  submitted in toto fine decalcification.  [facility] 09/04/2019)    Final Diagnosis performed by Manning Charity, MD.   Electronically signed  09/08/2019  Technical component performed at [facility], [address], [place], [place] [address].   Professional component performed at [employer]. [facility],  [address] [place], [place] [address].   Immunohistochemistry Technical component (if applicable) was performed  at [facility]. [address], [address],  [place], [place] [address].   IMMUNOHISTOCHEMISTRY DISCLAIMER (if applicable):  Some of these immunohistochemical stains may have been developed and the  performance characteristics determine by [facility]. Some  may not have been cleared or approved by the U.S. Food and Drug  Administration. The FDA has determined that such clearance or approval  is not necessary. This test is used for clinical purposes. It should not  be regarded as investigational or for research. This laboratory is  certified under the Clinical Laboratory Improvement Amendments of 1988  (CLIA-88) as  qualified to perform high complexity clinical laboratory  testing.  The controls stained appropriately.   ADDENDUM:   ** Please note this testing was performed and interpreted by an outside  facility.  This addendum is only being added to provide a summary of the  results for report completeness.  Please see electronic medical record  for a copy of the full report. **   Karyotype:  47,?XY,?del(?9)?(?q13q22)?,?+?mar[?3]?/?46,?XY[?20]?  Interpretation:  ABNORMAL MALE KARYOTYPE   Cytogenetic analysis shows an abnormal male karyotype. 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 The remaining twenty cells show a normal  karyotype.   Deletion of chromosome 9q occurs in both myeloid (more common) and  lymphoid disorders, including acute myeloid leukemia (AML) (more  commonly) and myelodysplastic syndrome (MDS) (only rarely). In MDS the  prognostic significance would be expected to be intermediate.   In AML  the prognosis is variable.  Marker chromosomes and structural  chromosomal aberrations in the absence of autosomal monosomies have  minimal prognostic impact. Correlation with other clinical and  laboratory findings is indicated.    RADIOGRAPHIC STUDIES:  No results found.  ASSESSMENT & PLAN Charles Li 83 y.o. male with medical history significant for left tonsillar diffuse B cell lymphoma in remission who presents for f/u.   In the interim since his last visit the patient has been well.  He is at his baseline health with no signs/symptoms of recurrence.  He is not having any new or concerning symptoms at this time. Review of Mr. Benedicto's PET CT scan from 11/17/2019 is reassuring with complete resolution of FDG avidity in the involved lymph nodes.   The treatment plan was for Rituximab 375 mg/m2, Cyclophosphamide 750mg /m2, etoposide IV 50mg /m2 (followed by 100mg /m2 PO on Day 2 and 3), and Vincristine 1.4 mg/m2 all on Day 1. The patient will also received PO prednisone 100mg  Day 1-5 of each 3 week cycle. Patient recieved ISRT with Dr. Basilio Cairo, completed on 12/23/2019.   #Diffuse Large B Cell Lymphoma (MYC Li). Stage I  --initial PET scan findings consistent with a Stage I diffuse large B cell lymphoma of the head/neck. Bone marrow biopsy confirmed no alternative sites of lymphoma.  --  started R-CEOP chemotherapy on 09/14/2019. This is therapy with curative intent. Etoposide was substituted for anthracycline therapy due to his atrial fibrillation found on TTE.  -- At this time his findings are consistent with a Stage I. Patient completed 3 cycles of R-CEOP with ISRT to site of disease. Interval PET scan on 11/17/2019 showed no active disease. Patient completed radiation therapy on 12/23/2019. Plan: --routine follow up imaging can be performed as clinically indicated.   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 We were agreeable to deferring evaluation to a later time.  --kidney function is stable and he has no urinary/pain symptoms --defer management urology service.   #Atrial Fibrillation --currently following with cardiology, underwent cardioversion on 12/16/2019.  --no bleeding, bruising, or other issues on Eliquis.   No orders of the defined types were placed in this encounter.   All questions were answered. The patient knows to call the clinic with any problems, questions or concerns.  A total of more than 25 minutes  were spent on this encounter and over half of that time was spent on counseling and coordination of care as outlined above.   Ulysees Barns, MD Department of Hematology/Oncology [facility] at [facility] Phone: [phone] Pager: [phone] Email: [email or web address]  01/28/2023 3:25 PM

## 2023-01-29 ENCOUNTER — Telehealth: Payer: Self-pay | Admitting: Hematology and Oncology

## 2023-04-16 DIAGNOSIS — Z85828 Personal history of other malignant neoplasm of skin: Secondary | ICD-10-CM | POA: Diagnosis not present

## 2023-04-16 DIAGNOSIS — L570 Actinic keratosis: Secondary | ICD-10-CM | POA: Diagnosis not present

## 2023-04-16 DIAGNOSIS — L821 Other seborrheic keratosis: Secondary | ICD-10-CM | POA: Diagnosis not present

## 2023-04-16 DIAGNOSIS — L309 Dermatitis, unspecified: Secondary | ICD-10-CM | POA: Diagnosis not present

## 2023-07-02 DIAGNOSIS — S80812A Abrasion, left lower leg, initial encounter: Secondary | ICD-10-CM | POA: Diagnosis not present

## 2023-07-02 DIAGNOSIS — L089 Local infection of the skin and subcutaneous tissue, unspecified: Secondary | ICD-10-CM | POA: Diagnosis not present

## 2023-08-19 ENCOUNTER — Ambulatory Visit (INDEPENDENT_AMBULATORY_CARE_PROVIDER_SITE_OTHER): Payer: Medicare HMO | Admitting: Family Medicine

## 2023-08-19 ENCOUNTER — Encounter: Payer: Self-pay | Admitting: Family Medicine

## 2023-08-19 VITALS — BP 110/68 | HR 61 | Temp 97.5°F | Wt 202.2 lb

## 2023-08-19 DIAGNOSIS — I10 Essential (primary) hypertension: Secondary | ICD-10-CM | POA: Diagnosis not present

## 2023-08-19 DIAGNOSIS — E785 Hyperlipidemia, unspecified: Secondary | ICD-10-CM | POA: Diagnosis not present

## 2023-08-19 DIAGNOSIS — I4819 Other persistent atrial fibrillation: Secondary | ICD-10-CM | POA: Diagnosis not present

## 2023-08-19 NOTE — Progress Notes (Signed)
 Established Patient Office Visit  Subjective   Patient ID: Charles Li, male    DOB: 11-24-1940  Age: 83 y.o. MRN: [id number]  Chief Complaint  Patient presents with   Medical Management of Chronic Issues    HPI   Charles Li seen for medical follow-up.  He has history of hypertension, atrial fibrillation, history of B-cell lymphoma which presented as a tonsillar mass, hyperlipidemia, glaucoma.  He is seen today accompanied by wife.  His wife has advancing dementia.  Charles Li has been followed at the [place] predominantly for the past few years.  He is followed locally for oncology.  His current blood pressure medications include amlodipine  5 mg and benazepril  HCTZ although he thinks this may be lisinopril HCTZ.  He also takes Eliquis  5 mg twice daily.  Takes pravastatin  40 mg once daily for lipids.  Labs have been done through the [place] reportedly about 3 months ago.  He states his blood pressures were up this past year and he was advised to increase his amlodipine  and benazepril  HCTZ.  He is currently taking both of them twice daily.  He has some dyspnea with things like stairs.  No chest pains.  Denies any recent falls.  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   HERNIA, VENTRAL 01/04/2009   HYPERLIPIDEMIA 11/28/2007   HYPERTENSION 12/12/2006   Leg swelling 04-24-12   occ., none at present   Persistent atrial fibrillation (HCC)    RENAL CALCULUS 03/24/2010   VENOUS INSUFFICIENCY 01/18/2010   Visual disturbance 04-24-12   legally blind   Past Surgical History:  Procedure Laterality Date   CARDIOVERSION N/A 12/16/2019   Procedure: CARDIOVERSION;  Surgeon: Maudine Sos, MD;  Location: [facility] ENDOSCOPY;   Service: Cardiovascular;  Laterality: N/A;   CATARACT EXTRACTION  2009 - approximate   bilat & glaucoma surgery (7 ophth surgeries)   INGUINAL HERNIA REPAIR  05/02/2012   Procedure: HERNIA REPAIR INGUINAL ADULT BILATERAL;  Surgeon: Thayne Fine, MD;  Location: [facility] ORS;  Service: General;  Laterality: N/A;   IR IMAGING GUIDED PORT INSERTION  09/07/2019   IR REMOVAL TUN ACCESS W/ PORT W/O FL MOD SED  01/25/2020   VASECTOMY  04-24-12   VENTRAL HERNIA REPAIR  05/02/2012   Procedure: LAPAROSCOPIC VENTRAL HERNIA;  Surgeon: Thayne Fine, MD;  Location: [facility]S;  Service: General;  Laterality: N/A;    reports that he quit smoking about 53 years ago. His smoking use included cigarettes. He started smoking about 67 years ago. He has a 21 pack-year smoking history. He has never used smokeless tobacco. He reports current alcohol use of about 14.0 standard drinks of alcohol per week. He reports that he does not use drugs. family history includes Birth defects in his paternal aunt; Cancer in his mother; Heart disease in his brother; Pneumonia in his father. Allergies  Allergen Reactions   Dorzolamide  Anaphylaxis and Dermatitis    Irritation on upper lids    Acetazolamide Diarrhea    Lost weight also Lost weight also    Bimatoprost Dermatitis and Other (See Comments)    Unknown Unknown    Brimonidine Other (See Comments)    Unknown Unknown  Brimonidine Tartrate-Timolol     Sulfa Antibiotics    Sulfamethoxazole Hives   Dorzolamide  Hcl-Timolol  Mal Rash    itching itching    Erythromycin Rash   Penicillins Rash   Sulfonamide Derivatives Rash    Review of Systems  Constitutional:  Negative for chills, fever and malaise/fatigue.  Eyes:  Negative for blurred vision.  Respiratory:  Positive for shortness of breath. Negative for cough and wheezing.   Cardiovascular:  Negative for chest pain.  Neurological:  Negative for dizziness, weakness and headaches.      Objective:     BP 110/68  (BP Location: Left Arm, Cuff Size: Normal)   Pulse 61   Temp (!) 97.5 F (36.4 C) (Oral)   Wt 202 lb 3.2 oz (91.7 kg)   SpO2 99%   BMI 29.01 kg/m  BP Readings from Last 3 Encounters:  08/19/23 110/68  01/28/23 (!) 161/95  07/30/22 (!) 158/104   Wt Readings from Last 3 Encounters:  08/19/23 202 lb 3.2 oz (91.7 kg)  01/28/23 198 lb 6.4 oz (90 kg)  07/30/22 195 lb 9.6 oz (88.7 kg)      Physical Exam Vitals reviewed.  Constitutional:      General: He is not in acute distress.    Appearance: He is well-developed.  Eyes:     Pupils: Pupils are equal, round, and reactive to light.  Neck:     Thyroid : No thyromegaly.  Cardiovascular:     Comments: Irregular rhythm with rate around 52 Pulmonary:     Effort: Pulmonary effort is normal. No respiratory distress.     Breath sounds: Normal breath sounds. No wheezing or rales.  Musculoskeletal:     Cervical back: Neck supple.     Right lower leg: No edema.     Left lower leg: No edema.  Neurological:     Mental Status: He is alert and oriented to person, place, and time.      No results found for any visits on 08/19/23.    The ASCVD Risk score (Arnett DK, et al., 2019) failed to calculate for the following reasons:   The 2019 ASCVD risk score is only valid for ages 25 to 65    Assessment & Plan:   #1 hypertension.  Initial reading today 160/86 but then repeat left arm seated after rest 110/68 and standing 108-68.  He does have a home blood pressure cuff from the [place] will consider bringing this by to check with ours.  We have not recommend titrating any further with blood pressure medication at this time.  Stay well-hydrated.  Change positions slowly.  #2 hyperlipidemia treated with pravastatin  40 mg daily.  He reportedly had labs done at the [place] about 2 to 3 months ago.  Will try to get copy of those  #3 A-fib.  Rate stable.  Patient on Eliquis  5 mg twice daily.  Charles Lamy, MD

## 2023-10-30 DIAGNOSIS — C009 Malignant neoplasm of lip, unspecified: Secondary | ICD-10-CM | POA: Diagnosis not present

## 2024-02-03 ENCOUNTER — Inpatient Hospital Stay: Payer: Medicare HMO | Attending: Hematology and Oncology

## 2024-02-03 ENCOUNTER — Inpatient Hospital Stay (HOSPITAL_BASED_OUTPATIENT_CLINIC_OR_DEPARTMENT_OTHER): Payer: Medicare HMO | Admitting: Hematology and Oncology

## 2024-02-03 ENCOUNTER — Other Ambulatory Visit: Payer: Self-pay | Admitting: Hematology and Oncology

## 2024-02-03 VITALS — BP 158/77 | HR 101 | Temp 97.9°F | Resp 16 | Wt 198.0 lb

## 2024-02-03 DIAGNOSIS — C8331 Diffuse large B-cell lymphoma, lymph nodes of head, face, and neck: Secondary | ICD-10-CM | POA: Diagnosis not present

## 2024-02-03 DIAGNOSIS — Z8572 Personal history of non-Hodgkin lymphomas: Secondary | ICD-10-CM | POA: Diagnosis not present

## 2024-02-03 DIAGNOSIS — I4891 Unspecified atrial fibrillation: Secondary | ICD-10-CM | POA: Diagnosis not present

## 2024-02-03 DIAGNOSIS — N1330 Unspecified hydronephrosis: Secondary | ICD-10-CM | POA: Diagnosis not present

## 2024-02-03 LAB — CMP (CANCER CENTER ONLY)
ALT: 12 U/L (ref 0–44)
AST: 15 U/L (ref 15–41)
Albumin: 4 g/dL (ref 3.5–5.0)
Alkaline Phosphatase: 55 U/L (ref 38–126)
Anion gap: 5 (ref 5–15)
BUN: 20 mg/dL (ref 8–23)
CO2: 35 mmol/L — ABNORMAL HIGH (ref 22–32)
Calcium: 10 mg/dL (ref 8.9–10.3)
Chloride: 98 mmol/L (ref 98–111)
Creatinine: 1.01 mg/dL (ref 0.61–1.24)
GFR, Estimated: >60 mL/min (ref >60)
Glucose, Bld: 154 mg/dL — ABNORMAL HIGH (ref 70–99)
Potassium: 4.2 mmol/L (ref 3.5–5.1)
Sodium: 138 mmol/L (ref 135–145)
Total Bilirubin: 0.7 mg/dL (ref 0.0–1.2)
Total Protein: 6.9 g/dL (ref 6.5–8.1)

## 2024-02-03 LAB — CBC WITH DIFFERENTIAL (CANCER CENTER ONLY)
Abs Immature Granulocytes: 0.02 K/uL (ref 0.00–0.07)
Basophils Absolute: 0.1 K/uL (ref 0.0–0.1)
Basophils Relative: 1 %
Eosinophils Absolute: 0.3 K/uL (ref 0.0–0.5)
Eosinophils Relative: 3 %
HCT: 41.4 % (ref 39.0–52.0)
Hemoglobin: 14.7 g/dL (ref 13.0–17.0)
Immature Granulocytes: 0 %
Lymphocytes Relative: 18 %
Lymphs Abs: 1.5 K/uL (ref 0.7–4.0)
MCH: 32.9 pg (ref 26.0–34.0)
MCHC: 35.5 g/dL (ref 30.0–36.0)
MCV: 92.6 fL (ref 80.0–100.0)
Monocytes Absolute: 0.8 K/uL (ref 0.1–1.0)
Monocytes Relative: 9 %
Neutro Abs: 6 K/uL (ref 1.7–7.7)
Neutrophils Relative %: 69 %
Platelet Count: 213 K/uL (ref 150–400)
RBC: 4.47 MIL/uL (ref 4.22–5.81)
RDW: 12.5 % (ref 11.5–15.5)
WBC Count: 8.7 K/uL (ref 4.0–10.5)
nRBC: 0 % (ref 0.0–0.2)

## 2024-02-03 LAB — LACTATE DEHYDROGENASE: LDH: 139 U/L (ref 98–192)

## 2024-02-03 NOTE — Progress Notes (Unsigned)
 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 Ethyl for tonsillar mass present for a few weeks. 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 FISH studies show MYC Rearrangement, negative BCL-2 and BCL-6 2) 08/17/2019: establish care with Dr. Federico   3) 08/28/2019: PET CT scan reveals activity in left lingual tonsil, level 2 lymph node, and level 3 lymph node. Consistent with Stage I 4) 09/04/2019: Bone marrow biopsy shows no evidence of lymphoma in the bone marrow.  5) 09/04/2019; TTE showed EF 60-65%, however patient found to have new atrial fibrillation.  6) 09/14/2019: started R-CEOP chemotherapy, Cycle 1 Day 1 7) 10/05/2019: R-CEOP chemotherapy, Cycle 2 Day 1 8) 10/26/2019: R-CEOP chemotherapy Cycle 3 Day 1 9) 11/30/2019- 12/23/2019: Radiation therapy with Dr. Izell  HISTORY OF PRESENTING ILLNESS:  Charles Li 83 y.o. male with medical history significant for left tonsillar B cell lymphoma presents for f/u. He was last seen on 01/28/2023. In the interim he has had no major changes in his health.  On exam today Mr. Charles Li notes that today will be his last clinic visit.  His wife unfortunately passed away earlier this year and he is deeply saddened by this.  He reports that he is followed with us  for 4 years and that if the cancer came back at this time at age 52 he would not want to do anything about it anyway.  He does not wish to continue coming to our  clinic.  He did however enjoy getting to know us .  He reports that he has been eating too much.  Because it is one of his remaining pleasures in life.  He reports that his energy levels are okay.  He notes that he did have a fall in the bog garden last year and injured his ribs but otherwise has been quite stable.  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  Otherwise he has no signs or symptoms concerning for recurrence.  He denies any lymphadenopathy or bumps or lumps.  He is not having any dysphagia or swallowing issues.  Overall he is at his baseline level of health.  Full 10 point ROS is otherwise negative.  I told the patient we are happy to see him back if he were to develop any questions or concerns or any new findings.  We wish him well and did enjoy getting to know him.  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  MEDICATIONS:  Current Outpatient Medications  Medication Sig Dispense Refill   amLODipine  (NORVASC ) 5 MG tablet Take by mouth.     apixaban  (ELIQUIS ) 5 MG TABS tablet Take 1 tablet (5 mg total) by mouth 2 (two) times daily. 180 tablet 3    benazepril -hydrochlorthiazide (LOTENSIN  HCT) 20-12.5 MG tablet Take 1 tablet by mouth daily. 30 tablet 0   dorzolamide -timolol  (COSOPT ) 22.3-6.8 MG/ML ophthalmic solution Place 1 drop into both eyes 2 (two) times daily.     escitalopram  (LEXAPRO ) 10 MG tablet TAKE 1 TABLET BY MOUTH EVERY DAY 90 tablet 1   Multiple Vitamin (MULTIVITAMIN WITH MINERALS) TABS tablet Take 1 tablet by mouth daily.     Netarsudil-Latanoprost (ROCKLATAN) 0.02-0.005 % SOLN Place 1 drop into both eyes at bedtime.      pravastatin  (PRAVACHOL ) 40 MG tablet Take 1 tablet (40 mg total) by mouth daily. Please schedule an appointment for further refills. [phone] (Patient taking differently: Take 40 mg by mouth every evening. Please schedule an appointment for further refills. [phone]) 90 tablet 3   No current facility-administered medications for this visit.    REVIEW OF SYSTEMS:   Constitutional: ( - ) fevers, ( - )  chills , ( - ) night sweats (+) hair loss Eyes: ( - ) blurriness of vision, ( - ) double vision, ( - ) watery eyes Ears, nose, mouth, throat, and face: ( - ) mucositis, ( + ) sore throat Respiratory: ( - ) cough, ( - ) dyspnea, ( - ) wheezes Cardiovascular: ( - ) palpitation, ( - ) chest discomfort, ( - ) lower extremity swelling Gastrointestinal:  ( - ) nausea, ( - ) heartburn, ( - ) change in bowel habits Skin: ( - ) abnormal skin rashes Lymphatics: ( - ) new lymphadenopathy, ( - ) easy bruising Neurological: ( - ) numbness, ( - ) tingling, ( - ) new weaknesses Behavioral/Psych: ( - ) mood change, ( - ) new changes  All other systems were reviewed with the patient and are negative.  PHYSICAL EXAMINATION: ECOG PERFORMANCE STATUS: 1 - Symptomatic but completely ambulatory  Vitals:   02/03/24 1435  BP: (!) 158/77  Pulse: (!) 101  Resp: 16  Temp: 97.9 F (36.6 C)  SpO2: 98%      Filed Weights   02/03/24 1435  Weight: 198 lb (89.8 kg)       GENERAL: well appearing elderly Caucasian  male in NAD SKIN: skin color, texture, turgor are normal, no rashes or significant lesions.  OROPHARYNX: no swelling, erythema, enlarged tonsils or new lesions.  EYES: conjunctiva are pink and non-injected, sclera clear LUNGS: clear to auscultation and percussion with normal breathing effort HEART: regular rate & rhythm and no murmurs and no lower extremity edema Musculoskeletal: no cyanosis of digits and no clubbing  PSYCH: alert & oriented x 3, fluent speech NEURO: no focal motor/sensory deficits  LABORATORY DATA:  I have reviewed the data as listed    Latest Ref Rng & Units 02/03/2024    2:05 PM 01/28/2023    3:03 PM 07/30/2022    2:11 PM  CBC  WBC 4.0 - 10.5 K/uL 8.7  8.8  8.7   Hemoglobin 13.0 - 17.0 g/dL 85.2  84.8  84.3   Hematocrit 39.0 - 52.0 % 41.4  42.4  43.9   Platelets  150 - 400 K/uL 213  220  237        Latest Ref Rng & Units 02/03/2024    2:05 PM 01/28/2023    3:03 PM 07/30/2022    2:11 PM  CMP  Glucose 70 - 99 mg/dL 845  873  896   BUN 8 - 23 mg/dL 20  20  21    Creatinine 0.61 - 1.24 mg/dL 8.98  9.06  9.02   Sodium 135 - 145 mmol/L 138  139  141   Potassium 3.5 - 5.1 mmol/L 4.2  4.8  3.8   Chloride 98 - 111 mmol/L 98  101  103   CO2 22 - 32 mmol/L 35  34  32   Calcium 8.9 - 10.3 mg/dL 89.9  89.7  89.9   Total Protein 6.5 - 8.1 g/dL 6.9  6.3  6.5   Total Bilirubin 0.0 - 1.2 mg/dL 0.7  0.8  0.6   Alkaline Phos 38 - 126 U/L 55  51  65   AST 15 - 41 U/L 15  17  17    ALT 0 - 44 U/L 12  13  12      PATHOLOGY:  SURGICAL PATHOLOGY  CASE: [id number]  PATIENT: Meryl Albus  Surgical Pathology Report   Clinical History: tonsil cancer (cm)   FINAL MICROSCOPIC DIAGNOSIS:   A. TONSIL, LEFT, BIOPSY:  -  Large B-cell lymphoma  -  See comment   COMMENT:  The tonsillar tissue has an ulcerated surface with underlying atypical  cellular proliferation.  By immunohistochemistry, the neoplastic cells  are positive for CD20, CD10, BCL6 and BCL2 (weak) but negative  for CD3,  CD56, CD5, Mum-1, cytokeratin AE1/3 and EBV by in situ hybridization.  The proliferative rate by Ki-67 is 70 to 80%.  Overall, the features are  consistent with a large B-cell lymphoma.  The differential diagnosis  includes diffuse large B-cell lymphoma and high-grade B-cell lymphoma  (double hit).  FISH is pending and will be reported in an addendum.   Preliminary results of this case were given to Dr. Ethyl on August 14, 2019.   Dr. Alvaro reviewed the case and agrees with the above diagnosis.   GROSS DESCRIPTION:   The specimen is received in formalin and consists of a 0.9 x 0.4 x 0.4  cm piece of tan soft tissue.  The specimen is entirely submitted in 1  cassette. SHIRLEEN 08/14/2019)   Final Diagnosis performed by Stephane Arts, MD.   Electronically signed  08/14/2019  Technical and / or Professional components performed at [facility]. [facility], [address] [place], [place] [address].   Immunohistochemistry Technical component (if applicable) was performed  at [facility]. [address], [address],  [place], [place] [address].   IMMUNOHISTOCHEMISTRY DISCLAIMER (if applicable):  Some of these immunohistochemical stains may have been developed and the  performance characteristics determine by [facility]. Some  may not have been cleared or approved by the U.S. Food and Drug  Administration. The FDA has determined that such clearance or approval  is not necessary. This test is used for clinical purposes. It should not  be regarded as investigational or for research. This laboratory is  certified under the Clinical Laboratory Improvement Amendments of 1988  (CLIA-88) as qualified to perform high complexity clinical laboratory  testing.  The controls stained appropriately.  SURGICAL PATHOLOGY  ** THIS IS AN ADDENDUM REPORT **  CASE: [id number]  PATIENT: CARLIN Mahnke  Bone Marrow Report  **Addendum **   Reason for Addendum  #1:  Cytogenetics results   Clinical History: lymphoma, DLBCL, left posterior iliac, (ADC)    DIAGNOSIS:   BONE MARROW, ASPIRATE, CLOT, CORE:  -  Normocellular marrow with trilineage hematopoiesis  -  No lymphoma identified   PERIPHERAL BLOOD:  -  Morphologically unremarkable  -  See complete blood cell count   MICROSCOPIC DESCRIPTION:   PERIPHERAL BLOOD SMEAR: The peripheral blood is morphologically  unremarkable.   BONE MARROW ASPIRATE: Spicular, cellular and adequate for evaluation  Erythroid precursors: Orderly maturation without overt dysplasia  Granulocytic precursors: Orderly maturation without overt dysplasia  Megakaryocytes: Qualitatively and quantitatively unremarkable  Lymphocytes/plasma cells: No lymphocytosis or plasmacytosis   TOUCH PREPARATIONS: Hypocellular with no additional findings as compared  to the aspirate smears.   CLOT AND BIOPSY: Examination of the core biopsy and clot section reveals  a normocellular bone marrow (30-40%) with trilineage hematopoiesis.  Myeloid and erythroid elements are present in essentially normal  proportions. Megakaryocytes display a spectrum of maturation without  clustering. There are no granulomas identified. A single well-formed  lymphoid aggregate is noted on the clot section. By  immunohistochemistry, is composed of an admixture of B and T cells  (CD20, CD3) without aberrant B-cell expression of CD5 or CD10.   IRON STAIN: Iron stains are performed on a bone marrow aspirate or touch  imprint smear and section of clot. The controls stained appropriately.        Storage Iron: Present       Ring Sideroblasts: Not identified   ADDITIONAL DATA/TESTING: Cytogenetics is pending and will be reported  separately. Flow cytometry performed on the bone marrow biopsy did not  identify a monoclonal B-cell or phenotypically aberrant T-cell  population (See [id number]).   CELL COUNT DATA:   Bone Marrow count performed on 500  cells shows:  Blasts:   0%   Myeloid:  57%  Promyelocytes: 0%   Erythroid:     28%  Myelocytes:    10%  Lymphocytes:   7%  Metamyelocytes:     2%   Plasma cells:  8%  Bands:    7%  Neutrophils:   33%  M:E ratio:     2.03  Eosinophils:   5%  Basophils:     0%  Monocytes:     0%   Lab Data: CBC performed on 09/04/2019 shows:  WBC: 8.6 k/uL  Neutrophils:   63%  Hgb: 14.2 g/dL Lymphocytes:   69%  HCT: 41.2 %    Monocytes:     3%  MCV: 94.7 fL   Eosinophils:   4%  RDW: 11.8 %    Basophils:     0%  PLT: 263 k/uL   GROSS DESCRIPTION:   A: Aspirate smear   B: Received in B-plus fixative are tissue fragments measuring 2.1 x 1.0  x 0.3 cm in aggregate.  The specimen is submitted in total.   C: Received in B-plus fixative is a 2.0 x 0.2 cm core of bone which is  submitted in toto fine decalcification.  [facility] 09/04/2019)    Final Diagnosis performed by Stephane Arts, MD.   Electronically signed  09/08/2019  Technical component performed at [facility], [address] [place], [place] [address].   Professional component performed at [employer]. [facility],  [address] [place], [place] [address].   Immunohistochemistry Technical component (if applicable) was performed  at [facility]. [address],  [place], [place] [address].   IMMUNOHISTOCHEMISTRY DISCLAIMER (if applicable):  Some of these immunohistochemical stains may have been developed and the  performance characteristics determine by [facility]. Some  may not have been cleared or approved by the U.S. Food and Drug  Administration. The FDA has determined that such clearance or approval  is not necessary. This test is used for clinical purposes. It should not  be regarded as investigational or for research. This laboratory is  certified under the Clinical Laboratory Improvement Amendments of 1988  (CLIA-88) as qualified to perform high complexity clinical  laboratory  testing.  The controls stained appropriately.   ADDENDUM:   ** Please note this testing was performed and interpreted by an outside  facility.  This addendum is only being added to provide a summary of the  results for report completeness.  Please see electronic medical record  for a copy of the full report. **   Karyotype: 47,?XY,?del(?9)?(?q13q22)?,?+?mar[?3]?/?46,?XY[?20]?  Interpretation:  ABNORMAL MALE KARYOTYPE   Cytogenetic analysis shows an abnormal male karyotype. 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 The remaining twenty cells show a normal  karyotype.   Deletion of chromosome 9q occurs in both myeloid (more common) and  lymphoid disorders, including acute myeloid leukemia (AML) (more  commonly) and myelodysplastic syndrome (MDS) (only rarely). In MDS the  prognostic significance would be expected to be intermediate.   In AML  the prognosis is variable.  Marker chromosomes and structural  chromosomal aberrations in the absence of autosomal monosomies have  minimal prognostic impact. Correlation with other clinical and  laboratory findings is indicated.    RADIOGRAPHIC STUDIES:  No results found.  ASSESSMENT & PLAN PRESS CASALE 83 y.o. male with medical history significant for left tonsillar diffuse B cell lymphoma in remission who presents for f/u.   In the interim since his last visit the patient has been well.  He is at his baseline health with no signs/symptoms of recurrence.  He is not having any new or concerning symptoms at this time. Review of Mr. Reigle's PET CT scan from 11/17/2019 is reassuring with complete resolution of FDG avidity in the involved lymph nodes.   The treatment plan was for Rituximab  375 mg/m2, Cyclophosphamide  750mg /m2, etoposide  IV 50mg /m2 (followed by 100mg /m2 PO on Day 2 and 3), and Vincristine  1.4 mg/m2 all on Day 1. The patient will also received PO  prednisone  100mg  Day 1-5 of each 3 week cycle. Patient recieved ISRT with Dr. Izell, completed on 12/23/2019.   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 Bone marrow biopsy confirmed no alternative sites of lymphoma.  -- started R-CEOP chemotherapy on 09/14/2019. This is therapy with curative intent. Etoposide  was substituted for anthracycline therapy due to his atrial fibrillation found on TTE.  -- At this time his findings are consistent with a Stage I. Patient completed 3 cycles of R-CEOP with ISRT to site of disease. Interval PET scan on 11/17/2019 showed no active disease. Patient completed radiation therapy on 12/23/2019. Plan: --routine follow up imaging can be performed as clinically indicated.   --Labs today show white blood cell count 8.7, hemoglobin 14.7, MCV 92.6, platelets 213 --RTC as needed moving forward per patient request.  We are happy to see him  back if he were to develop any new issues or concerns.  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 We were agreeable to deferring evaluation to a later time.  --kidney function is stable and he has no urinary/pain symptoms --defer management urology service.   #Atrial Fibrillation --currently following with cardiology, underwent cardioversion on 12/16/2019.  --no bleeding, bruising, or other issues on Eliquis .   No orders of the defined types were placed in this encounter.   All questions were answered. The patient knows to call the clinic with any problems, questions or concerns.  A total of more than 25 minutes were spent on this encounter and over half of that time was spent on counseling and coordination of care as outlined above.   Norleen IVAR Kidney, MD Department of Hematology/Oncology [facility] Phone: [phone] Pager: [phone] Email: [email or web address]  02/04/2024 3:23 PM

## 2024-02-04 ENCOUNTER — Encounter: Payer: Self-pay | Admitting: Hematology and Oncology

## 2024-07-08 ENCOUNTER — Telehealth: Payer: Self-pay | Admitting: *Deleted

## 2024-07-08 ENCOUNTER — Telehealth: Payer: Self-pay | Admitting: Hematology and Oncology

## 2024-07-08 NOTE — Telephone Encounter (Signed)
 Received call from pt requesting an appt with Dr. Federico. He saw his regular MD at the [facility] for tongue pain. His PCP recommended he see Dr. Federico due to his h/o DLBCL of lymph nodes in his neck. Advised that Dr. Federico could see him on 07/23/24 with labs at 1:15 pm and clinic visit at 1:40 pm Pt is agreeable to this. Scheduling message sent.

## 2024-07-08 NOTE — Telephone Encounter (Signed)
 I spoke with patient and he is aware of lab and MD visit on 07/23/2024.

## 2024-07-23 ENCOUNTER — Inpatient Hospital Stay (HOSPITAL_BASED_OUTPATIENT_CLINIC_OR_DEPARTMENT_OTHER): Admitting: Hematology and Oncology

## 2024-07-23 ENCOUNTER — Inpatient Hospital Stay: Attending: Hematology and Oncology

## 2024-07-23 ENCOUNTER — Other Ambulatory Visit: Payer: Self-pay | Admitting: Hematology and Oncology

## 2024-07-23 VITALS — BP 159/72 | HR 64 | Temp 97.5°F | Resp 13 | Wt 201.5 lb

## 2024-07-23 DIAGNOSIS — C8331 Diffuse large B-cell lymphoma, lymph nodes of head, face, and neck: Secondary | ICD-10-CM

## 2024-07-23 LAB — CBC WITH DIFFERENTIAL (CANCER CENTER ONLY)
Abs Immature Granulocytes: 0.08 K/uL — ABNORMAL HIGH (ref 0.00–0.07)
Basophils Absolute: 0 K/uL (ref 0.0–0.1)
Basophils Relative: 0 %
Eosinophils Absolute: 0.2 K/uL (ref 0.0–0.5)
Eosinophils Relative: 2 %
HCT: 45.7 % (ref 39.0–52.0)
Hemoglobin: 16.6 g/dL (ref 13.0–17.0)
Immature Granulocytes: 1 %
Lymphocytes Relative: 19 %
Lymphs Abs: 2 K/uL (ref 0.7–4.0)
MCH: 33.1 pg (ref 26.0–34.0)
MCHC: 36.3 g/dL — ABNORMAL HIGH (ref 30.0–36.0)
MCV: 91 fL (ref 80.0–100.0)
Monocytes Absolute: 0.8 K/uL (ref 0.1–1.0)
Monocytes Relative: 8 %
Neutro Abs: 7.2 K/uL (ref 1.7–7.7)
Neutrophils Relative %: 70 %
Platelet Count: 261 K/uL (ref 150–400)
RBC: 5.02 MIL/uL (ref 4.22–5.81)
RDW: 12.5 % (ref 11.5–15.5)
WBC Count: 10.3 K/uL (ref 4.0–10.5)
nRBC: 0 % (ref 0.0–0.2)

## 2024-07-23 LAB — CMP (CANCER CENTER ONLY)
ALT: 17 U/L (ref 0–44)
AST: 24 U/L (ref 15–41)
Albumin: 4.6 g/dL (ref 3.5–5.0)
Alkaline Phosphatase: 62 U/L (ref 38–126)
Anion gap: 11 (ref 5–15)
BUN: 22 mg/dL (ref 8–23)
CO2: 32 mmol/L (ref 22–32)
Calcium: 10 mg/dL (ref 8.9–10.3)
Chloride: 93 mmol/L — ABNORMAL LOW (ref 98–111)
Creatinine: 1.24 mg/dL (ref 0.61–1.24)
GFR, Estimated: 58 mL/min — ABNORMAL LOW
Glucose, Bld: 166 mg/dL — ABNORMAL HIGH (ref 70–99)
Potassium: 3.7 mmol/L (ref 3.5–5.1)
Sodium: 137 mmol/L (ref 135–145)
Total Bilirubin: 1 mg/dL (ref 0.0–1.2)
Total Protein: 7.5 g/dL (ref 6.5–8.1)

## 2024-07-23 LAB — LACTATE DEHYDROGENASE: LDH: 164 U/L (ref 105–235)

## 2024-07-23 NOTE — Progress Notes (Signed)
 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 Ethyl for tonsillar mass present for a few weeks. 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 FISH studies show MYC Rearrangement, negative BCL-2 and BCL-6 2) 08/17/2019: establish care with Dr. Federico   3) 08/28/2019: PET CT scan reveals activity in left lingual tonsil, level 2 lymph node, and level 3 lymph node. Consistent with Stage I 4) 09/04/2019: Bone marrow biopsy shows no evidence of lymphoma in the bone marrow.  5) 09/04/2019; TTE showed EF 60-65%, however patient found to have new atrial fibrillation.  6) 09/14/2019: started R-CEOP chemotherapy, Cycle 1 Day 1 7) 10/05/2019: R-CEOP chemotherapy, Cycle 2 Day 1 8) 10/26/2019: R-CEOP chemotherapy Cycle 3 Day 1 9) 11/30/2019- 12/23/2019: Radiation therapy with Dr. Izell  HISTORY OF PRESENTING ILLNESS:  Charles Li 84 y.o. male with medical history significant for left tonsillar B cell lymphoma presents for f/u. He was last seen on 02/03/2024. In the interim he has had no major changes in his health.  On exam today Mr. Blazejewski notes he is been having some difficulties with pain in his tongue.  He reports that he gets these flashes the pain that last for 2 to 3 seconds on the right side of his tongue.  He reports he does not feel any bumps or lumps and it is not affected by chewing or the time of day.  He reports that there does not appear to be any relation to  eating.  He reports that the longest time the pain has lasted has been slightly less than 3 seconds.  He reports that he is able to move his tongue front to back and side-to-side without any pain or difficulty.  He has had no change in his taste buds.  He notes that he does not have any foul taste in his mouth and has not noticed any metallic taste like blood.  He reports that he is not having any cramps or spasms and muscles in his body elsewhere.  He reports that he has had no other changes in his health such as runny nose, sore throat, cough.  He denies any fevers, chills, sweats.  He did receive his flu shot.  Overall he feels well and has no additional questions concerns or complaints today.  Full 10 point ROS is otherwise negative.  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  MEDICATIONS:  Current Outpatient Medications  Medication Sig Dispense Refill   amLODipine  (NORVASC ) 5 MG tablet Take by mouth. (Patient taking differently: Take 5 mg by mouth 2 (two) times daily.)     benazepril -hydrochlorthiazide (LOTENSIN  HCT) 20-12.5 MG tablet Take 1 tablet by mouth daily. (Patient taking  differently: Take 1 tablet by mouth 2 (two) times daily.) 30 tablet 0   apixaban  (ELIQUIS ) 5 MG TABS tablet Take 1 tablet (5 mg total) by mouth 2 (two) times daily. 180 tablet 3   dorzolamide-timolol  (COSOPT) 22.3-6.8 MG/ML ophthalmic solution Place 1 drop into both eyes 2 (two) times daily.     escitalopram  (LEXAPRO ) 10 MG tablet TAKE 1 TABLET BY MOUTH EVERY DAY 90 tablet 1   Multiple Vitamin (MULTIVITAMIN WITH MINERALS) TABS tablet Take 1 tablet by mouth daily.     Netarsudil-Latanoprost (ROCKLATAN) 0.02-0.005 % SOLN Place 1 drop into both eyes at bedtime.      pravastatin  (PRAVACHOL ) 40 MG tablet Take 1 tablet (40 mg total) by mouth daily. Please schedule an appointment for further refills. [phone] (Patient taking differently: Take 40 mg by mouth every evening. Please schedule an appointment for further refills. [phone]) 90 tablet 3   No current facility-administered medications for this visit.    REVIEW OF SYSTEMS:   Constitutional: ( - ) fevers, ( - )  chills , ( - ) night sweats (+) hair loss Eyes: ( - ) blurriness of vision, ( - ) double vision, ( - ) watery eyes Ears, nose, mouth, throat, and face: ( - ) mucositis, ( + ) sore throat Respiratory: ( - ) cough, ( - ) dyspnea, ( - ) wheezes Cardiovascular: ( - ) palpitation, ( - ) chest discomfort, ( - ) lower extremity swelling Gastrointestinal:  ( - ) nausea, ( - ) heartburn, ( - ) change in bowel habits Skin: ( - ) abnormal skin rashes Lymphatics: ( - ) new lymphadenopathy, ( - ) easy bruising Neurological: ( - ) numbness, ( - ) tingling, ( - ) new weaknesses Behavioral/Psych: ( - ) mood change, ( - ) new changes  All other systems were reviewed with the patient and are negative.  PHYSICAL EXAMINATION: ECOG PERFORMANCE STATUS: 1 - Symptomatic but completely ambulatory  Vitals:   07/23/24 1401 07/23/24 1402  BP: (!) 159/90 (!) 159/72  Pulse: 64   Resp: 13   Temp: (!) 97.5 F (36.4 C)   SpO2: 98%       Filed  Weights   07/23/24 1401  Weight: 201 lb 8 oz (91.4 kg)       GENERAL: well appearing elderly Caucasian male in NAD SKIN: skin color, texture, turgor are normal, no rashes or significant lesions.  OROPHARYNX: no swelling, erythema, enlarged tonsils or new lesions.  TONGUE: No overt lesions, discoloration, erythema, or lumps.  Grossly normal on inspection.  Full range of motion. EYES: conjunctiva are pink and non-injected, sclera clear LUNGS: clear to auscultation and percussion with normal breathing effort HEART: regular rate & rhythm and no murmurs and no lower extremity edema Musculoskeletal: no cyanosis of digits and no clubbing  PSYCH: alert & oriented x 3, fluent speech NEURO: no focal motor/sensory deficits  LABORATORY DATA:  I have reviewed the data as listed    Latest Ref Rng & Units 07/23/2024    1:02 PM 02/03/2024    2:05 PM 01/28/2023  3:03 PM  CBC  WBC 4.0 - 10.5 K/uL 10.3  8.7  8.8   Hemoglobin 13.0 - 17.0 g/dL 83.3  85.2  84.8   Hematocrit 39.0 - 52.0 % 45.7  41.4  42.4   Platelets 150 - 400 K/uL 261  213  220        Latest Ref Rng & Units 07/23/2024    1:02 PM 02/03/2024    2:05 PM 01/28/2023    3:03 PM  CMP  Glucose 70 - 99 mg/dL 833  845  873   BUN 8 - 23 mg/dL 22  20  20    Creatinine 0.61 - 1.24 mg/dL 8.75  8.98  9.06   Sodium 135 - 145 mmol/L 137  138  139   Potassium 3.5 - 5.1 mmol/L 3.7  4.2  4.8   Chloride 98 - 111 mmol/L 93  98  101   CO2 22 - 32 mmol/L 32  35  34   Calcium 8.9 - 10.3 mg/dL 89.9  89.9  89.7   Total Protein 6.5 - 8.1 g/dL 7.5  6.9  6.3   Total Bilirubin 0.0 - 1.2 mg/dL 1.0  0.7  0.8   Alkaline Phos 38 - 126 U/L 62  55  51   AST 15 - 41 U/L 24  15  17    ALT 0 - 44 U/L 17  12  13      PATHOLOGY:  SURGICAL PATHOLOGY  CASE: [id number]  PATIENT: Charles Li  Surgical Pathology Report   Clinical History: tonsil cancer (cm)   FINAL MICROSCOPIC DIAGNOSIS:   A. TONSIL, LEFT, BIOPSY:  -  Large B-cell lymphoma  -  See  comment   COMMENT:  The tonsillar tissue has an ulcerated surface with underlying atypical  cellular proliferation.  By immunohistochemistry, the neoplastic cells  are positive for CD20, CD10, BCL6 and BCL2 (weak) but negative for CD3,  CD56, CD5, Mum-1, cytokeratin AE1/3 and EBV by in situ hybridization.  The proliferative rate by Ki-67 is 70 to 80%.  Overall, the features are  consistent with a large B-cell lymphoma.  The differential diagnosis  includes diffuse large B-cell lymphoma and high-grade B-cell lymphoma  (double hit).  FISH is pending and will be reported in an addendum.   Preliminary results of this case were given to Dr. Ethyl on August 14, 2019.   Dr. Alvaro reviewed the case and agrees with the above diagnosis.   GROSS DESCRIPTION:   The specimen is received in formalin and consists of a 0.9 x 0.4 x 0.4  cm piece of tan soft tissue.  The specimen is entirely submitted in 1  cassette. SHIRLEEN 08/14/2019)   Final Diagnosis performed by Stephane Arts, MD.   Electronically signed  08/14/2019  Technical and / or Professional components performed at [facility]. [facility], [address] [place], [place] [address].   Immunohistochemistry Technical component (if applicable) was performed  at [facility]. [address], [address],  [place], [place] [address].   IMMUNOHISTOCHEMISTRY DISCLAIMER (if applicable):  Some of these immunohistochemical stains may have been developed and the  performance characteristics determine by [facility]. Some  may not have been cleared or approved by the U.S. Food and Drug  Administration. The FDA has determined that such clearance or approval  is not necessary. This test is used for clinical purposes. It should not  be regarded as investigational or for research. This laboratory is  certified under  the Clinical Laboratory Improvement Amendments of 1988  (CLIA-88) as qualified to perform high complexity  clinical laboratory  testing.  The controls stained appropriately.  SURGICAL PATHOLOGY  ** THIS IS AN ADDENDUM REPORT **  CASE: [id number]  PATIENT: Juron Heiden  Bone Marrow Report  **Addendum **   Reason for Addendum #1:  Cytogenetics results   Clinical History: lymphoma, DLBCL, left posterior iliac, (ADC)    DIAGNOSIS:   BONE MARROW, ASPIRATE, CLOT, CORE:  -  Normocellular marrow with trilineage hematopoiesis  -  No lymphoma identified   PERIPHERAL BLOOD:  -  Morphologically unremarkable  -  See complete blood cell count   MICROSCOPIC DESCRIPTION:   PERIPHERAL BLOOD SMEAR: The peripheral blood is morphologically  unremarkable.   BONE MARROW ASPIRATE: Spicular, cellular and adequate for evaluation  Erythroid precursors: Orderly maturation without overt dysplasia  Granulocytic precursors: Orderly maturation without overt dysplasia  Megakaryocytes: Qualitatively and quantitatively unremarkable  Lymphocytes/plasma cells: No lymphocytosis or plasmacytosis   TOUCH PREPARATIONS: Hypocellular with no additional findings as compared  to the aspirate smears.   CLOT AND BIOPSY: Examination of the core biopsy and clot section reveals  a normocellular bone marrow (30-40%) with trilineage hematopoiesis.  Myeloid and erythroid elements are present in essentially normal  proportions. Megakaryocytes display a spectrum of maturation without  clustering. There are no granulomas identified. A single well-formed  lymphoid aggregate is noted on the clot section. By  immunohistochemistry, is composed of an admixture of B and T cells  (CD20, CD3) without aberrant B-cell expression of CD5 or CD10.   IRON STAIN: Iron stains are performed on a bone marrow aspirate or touch  imprint smear and section of clot. The controls stained appropriately.        Storage Iron: Present       Ring Sideroblasts: Not identified   ADDITIONAL DATA/TESTING: Cytogenetics is pending and will be reported   separately. Flow cytometry performed on the bone marrow biopsy did not  identify a monoclonal B-cell or phenotypically aberrant T-cell  population (See [id number]).   CELL COUNT DATA:   Bone Marrow count performed on 500 cells shows:  Blasts:   0%   Myeloid:  57%  Promyelocytes: 0%   Erythroid:     28%  Myelocytes:    10%  Lymphocytes:   7%  Metamyelocytes:     2%   Plasma cells:  8%  Bands:    7%  Neutrophils:   33%  M:E ratio:     2.03  Eosinophils:   5%  Basophils:     0%  Monocytes:     0%   Lab Data: CBC performed on 09/04/2019 shows:  WBC: 8.6 k/uL  Neutrophils:   63%  Hgb: 14.2 g/dL Lymphocytes:   69%  HCT: 41.2 %    Monocytes:     3%  MCV: 94.7 fL   Eosinophils:   4%  RDW: 11.8 %    Basophils:     0%  PLT: 263 k/uL   GROSS DESCRIPTION:   A: Aspirate smear   B: Received in B-plus fixative are tissue fragments measuring 2.1 x 1.0  x 0.3 cm in aggregate.  The specimen is submitted in total.   C: Received in B-plus fixative is a 2.0 x 0.2 cm core of bone which is  submitted in toto fine decalcification.  [facility] 09/04/2019)    Final Diagnosis performed by Stephane Arts, MD.   Electronically signed  09/08/2019  Technical component performed at [facility]  [facility], [address] [place], [place] [address].   Professional component performed at [employer]. [facility],  [address] [place], [place] [address].   Immunohistochemistry Technical component (if applicable) was performed  at [facility]. [address],  [place], [place] [address].   IMMUNOHISTOCHEMISTRY DISCLAIMER (if applicable):  Some of these immunohistochemical stains may have been developed and the  performance characteristics determine by [facility]. Some  may not have been cleared or approved by the U.S. Food and Drug  Administration. The FDA has determined that such clearance or approval  is not necessary. This test is used for clinical  purposes. It should not  be regarded as investigational or for research. This laboratory is  certified under the Clinical Laboratory Improvement Amendments of 1988  (CLIA-88) as qualified to perform high complexity clinical laboratory  testing.  The controls stained appropriately.   ADDENDUM:   ** Please note this testing was performed and interpreted by an outside  facility.  This addendum is only being added to provide a summary of the  results for report completeness.  Please see electronic medical record  for a copy of the full report. **   Karyotype: 47,?XY,?del(?9)?(?q13q22)?,?+?mar[?3]?/?46,?XY[?20]?  Interpretation:  ABNORMAL MALE KARYOTYPE   Cytogenetic analysis shows an abnormal male karyotype. 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 The remaining twenty cells show a normal  karyotype.   Deletion of chromosome 9q occurs in both myeloid (more common) and  lymphoid disorders, including acute myeloid leukemia (AML) (more  commonly) and myelodysplastic syndrome (MDS) (only rarely). In MDS the  prognostic significance would be expected to be intermediate.   In AML  the prognosis is variable.  Marker chromosomes and structural  chromosomal aberrations in the absence of autosomal monosomies have  minimal prognostic impact. Correlation with other clinical and  laboratory findings is indicated.    RADIOGRAPHIC STUDIES:  No results found.  ASSESSMENT & PLAN Charles Li 84 y.o. male with medical history significant for left tonsillar diffuse B cell lymphoma in remission who presents for f/u.   In the interim since his last visit the patient has been well.  He is at his baseline health with no signs/symptoms of recurrence.  He is not having any new or concerning symptoms at this time. Review of Mr. Opara's PET CT scan from 11/17/2019 is reassuring with complete resolution of FDG avidity in the involved  lymph nodes.   The treatment plan was for Rituximab  375 mg/m2, Cyclophosphamide  750mg /m2, etoposide  IV 50mg /m2 (followed by 100mg /m2 PO on Day 2 and 3), and Vincristine  1.4 mg/m2 all on Day 1. The patient will also received PO prednisone  100mg  Day 1-5 of each 3 week cycle. Patient recieved ISRT with Dr. Izell, completed on 12/23/2019.   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 Bone marrow biopsy confirmed no alternative sites of lymphoma.  -- started R-CEOP chemotherapy on 09/14/2019. This is therapy with curative intent. Etoposide  was substituted for anthracycline therapy due to his atrial fibrillation found on TTE.  -- At this time his findings are consistent with a Stage I. Patient completed 3 cycles of R-CEOP with ISRT to site of disease. Interval PET scan on 11/17/2019 showed no active disease. Patient completed radiation therapy on  12/23/2019. Plan: --routine follow up imaging can be performed as clinically indicated.   --Labs today show white blood cell count 10.3, Hgb 16.6, MCV 91, Plt 261.  Creatinine 1.24 --RTC as needed moving forward per patient request.  We are happy to see him back if he were to develop any new issues or concerns.  # Tongue Pain/Spasm -- No evidence of a tongue lesion on physical exam.  No bumps, lumps, erythema, abrasions, lacerations, or other concerning abnormalities. -- Patient has full range of motion this time. -- Pain appears to be quite short-lived, not lasting more than 3 seconds.  Appears most consistent with a spasm of the muscle of his tongue. -- Recommend continued monitoring and prompt return to us  if there is any evidence of a tongue lesion.  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 We were agreeable to deferring evaluation to a later time.   --kidney function is stable and he has no urinary/pain symptoms --defer management urology service.   #Atrial Fibrillation --currently following with cardiology, underwent cardioversion on 12/16/2019.  --no bleeding, bruising, or other issues on Eliquis .   No orders of the defined types were placed in this encounter.   All questions were answered. The patient knows to call the clinic with any problems, questions or concerns.  A total of more than 25 minutes were spent on this encounter and over half of that time was spent on counseling and coordination of care as outlined above.   Norleen IVAR Kidney, MD Department of Hematology/Oncology [facility] Phone: [phone] Pager: [phone] Email: [email or web address]  07/23/2024 2:08 PM "

## 2024-07-28 ENCOUNTER — Encounter: Payer: Self-pay | Admitting: Hematology and Oncology
